# Patient Record
Sex: Female | Born: 1965 | Race: Black or African American | Hispanic: No | Marital: Single
Health system: Southern US, Community
[De-identification: ages and names within clinical notes are randomized; demographics above are authoritative.]

## PROBLEM LIST (undated history)

## (undated) DIAGNOSIS — K219 Gastro-esophageal reflux disease without esophagitis: Secondary | ICD-10-CM

## (undated) DIAGNOSIS — Z86010 Personal history of colonic polyps: Secondary | ICD-10-CM

## (undated) DIAGNOSIS — D649 Anemia, unspecified: Secondary | ICD-10-CM

## (undated) DIAGNOSIS — Z860101 Personal history of adenomatous and serrated colon polyps: Secondary | ICD-10-CM

## (undated) DIAGNOSIS — F329 Major depressive disorder, single episode, unspecified: Secondary | ICD-10-CM

## (undated) DIAGNOSIS — D219 Benign neoplasm of connective and other soft tissue, unspecified: Secondary | ICD-10-CM

## (undated) DIAGNOSIS — N393 Stress incontinence (female) (male): Secondary | ICD-10-CM

## (undated) DIAGNOSIS — K589 Irritable bowel syndrome without diarrhea: Secondary | ICD-10-CM

## (undated) DIAGNOSIS — M8580 Other specified disorders of bone density and structure, unspecified site: Secondary | ICD-10-CM

## (undated) DIAGNOSIS — F32A Depression, unspecified: Secondary | ICD-10-CM

## (undated) DIAGNOSIS — K449 Diaphragmatic hernia without obstruction or gangrene: Secondary | ICD-10-CM

## (undated) DIAGNOSIS — I10 Essential (primary) hypertension: Secondary | ICD-10-CM

## (undated) HISTORY — DX: Major depressive disorder, single episode, unspecified: F32.9

## (undated) HISTORY — PX: UTERINE FIBROID EMBOLIZATION: SHX825

## (undated) HISTORY — DX: Irritable bowel syndrome without diarrhea: K58.9

## (undated) HISTORY — DX: Gastro-esophageal reflux disease without esophagitis: K21.9

## (undated) HISTORY — DX: Essential (primary) hypertension: I10

## (undated) HISTORY — DX: Diaphragmatic hernia without obstruction or gangrene: K44.9

## (undated) HISTORY — DX: Anemia, unspecified: D64.9

## (undated) HISTORY — DX: Depression, unspecified: F32.A

## (undated) HISTORY — DX: Other specified disorders of bone density and structure, unspecified site: M85.80

## (undated) HISTORY — DX: Personal history of adenomatous and serrated colon polyps: Z86.0101

## (undated) HISTORY — DX: Personal history of colonic polyps: Z86.010

## (undated) HISTORY — PX: POLYPECTOMY: SHX149

## (undated) HISTORY — DX: Stress incontinence (female) (male): N39.3

## (undated) HISTORY — DX: Benign neoplasm of connective and other soft tissue, unspecified: D21.9

---

## 1997-05-24 ENCOUNTER — Ambulatory Visit (HOSPITAL_COMMUNITY): Admission: RE | Admit: 1997-05-24 | Discharge: 1997-05-24 | Payer: Self-pay | Admitting: Obstetrics and Gynecology

## 1998-07-15 ENCOUNTER — Other Ambulatory Visit: Admission: RE | Admit: 1998-07-15 | Discharge: 1998-07-15 | Payer: Self-pay | Admitting: Obstetrics and Gynecology

## 1999-01-05 ENCOUNTER — Ambulatory Visit (HOSPITAL_COMMUNITY): Admission: RE | Admit: 1999-01-05 | Discharge: 1999-01-05 | Payer: Self-pay | Admitting: Internal Medicine

## 1999-09-02 ENCOUNTER — Other Ambulatory Visit: Admission: RE | Admit: 1999-09-02 | Discharge: 1999-09-02 | Payer: Self-pay | Admitting: Obstetrics and Gynecology

## 2000-01-23 ENCOUNTER — Ambulatory Visit (HOSPITAL_COMMUNITY): Admission: RE | Admit: 2000-01-23 | Discharge: 2000-01-23 | Payer: Self-pay | Admitting: Family Medicine

## 2000-01-23 ENCOUNTER — Encounter: Payer: Self-pay | Admitting: Family Medicine

## 2000-09-16 ENCOUNTER — Other Ambulatory Visit: Admission: RE | Admit: 2000-09-16 | Discharge: 2000-09-16 | Payer: Self-pay | Admitting: Obstetrics and Gynecology

## 2000-10-19 ENCOUNTER — Ambulatory Visit (HOSPITAL_COMMUNITY): Admission: RE | Admit: 2000-10-19 | Discharge: 2000-10-19 | Payer: Self-pay | Admitting: Obstetrics and Gynecology

## 2000-10-24 ENCOUNTER — Ambulatory Visit (HOSPITAL_COMMUNITY): Admission: RE | Admit: 2000-10-24 | Discharge: 2000-10-24 | Payer: Self-pay | Admitting: Obstetrics and Gynecology

## 2000-10-24 ENCOUNTER — Encounter: Payer: Self-pay | Admitting: Obstetrics and Gynecology

## 2001-04-29 ENCOUNTER — Encounter: Payer: Self-pay | Admitting: Infectious Diseases

## 2001-04-29 ENCOUNTER — Ambulatory Visit (HOSPITAL_COMMUNITY): Admission: RE | Admit: 2001-04-29 | Discharge: 2001-04-29 | Payer: Self-pay | Admitting: Infectious Diseases

## 2001-11-01 ENCOUNTER — Encounter: Payer: Self-pay | Admitting: Obstetrics and Gynecology

## 2001-11-01 ENCOUNTER — Ambulatory Visit (HOSPITAL_COMMUNITY): Admission: RE | Admit: 2001-11-01 | Discharge: 2001-11-01 | Payer: Self-pay | Admitting: Obstetrics and Gynecology

## 2002-04-30 ENCOUNTER — Encounter: Payer: Self-pay | Admitting: Obstetrics and Gynecology

## 2002-04-30 ENCOUNTER — Encounter: Admission: RE | Admit: 2002-04-30 | Discharge: 2002-04-30 | Payer: Self-pay | Admitting: Obstetrics and Gynecology

## 2002-10-29 ENCOUNTER — Other Ambulatory Visit: Admission: RE | Admit: 2002-10-29 | Discharge: 2002-10-29 | Payer: Self-pay | Admitting: Gynecology

## 2004-08-31 ENCOUNTER — Encounter: Admission: RE | Admit: 2004-08-31 | Discharge: 2004-08-31 | Payer: Self-pay | Admitting: Family Medicine

## 2004-09-29 ENCOUNTER — Ambulatory Visit (HOSPITAL_COMMUNITY): Admission: RE | Admit: 2004-09-29 | Discharge: 2004-09-29 | Payer: Self-pay | Admitting: Obstetrics and Gynecology

## 2004-12-22 ENCOUNTER — Ambulatory Visit: Payer: Self-pay | Admitting: Internal Medicine

## 2004-12-28 ENCOUNTER — Ambulatory Visit: Payer: Self-pay | Admitting: Internal Medicine

## 2005-01-01 ENCOUNTER — Ambulatory Visit: Payer: Self-pay | Admitting: Internal Medicine

## 2005-01-01 HISTORY — PX: COLONOSCOPY: SHX174

## 2005-04-30 ENCOUNTER — Encounter: Admission: RE | Admit: 2005-04-30 | Discharge: 2005-04-30 | Payer: Self-pay | Admitting: Family Medicine

## 2005-12-06 ENCOUNTER — Other Ambulatory Visit: Admission: RE | Admit: 2005-12-06 | Discharge: 2005-12-06 | Payer: Self-pay | Admitting: Gynecology

## 2006-01-24 ENCOUNTER — Ambulatory Visit: Payer: Self-pay | Admitting: Family Medicine

## 2006-01-24 LAB — CONVERTED CEMR LAB
BUN: 15 mg/dL (ref 6–23)
CO2: 29 meq/L (ref 19–32)
Calcium: 9.5 mg/dL (ref 8.4–10.5)
Chloride: 103 meq/L (ref 96–112)
Creatinine, Ser: 1 mg/dL (ref 0.4–1.2)
GFR calc non Af Amer: 65 mL/min
Glomerular Filtration Rate, Af Am: 79 mL/min/{1.73_m2}
Glucose, Bld: 86 mg/dL (ref 70–99)
Potassium: 3.5 meq/L (ref 3.5–5.1)
Sodium: 138 meq/L (ref 135–145)

## 2006-05-05 ENCOUNTER — Ambulatory Visit: Payer: Self-pay | Admitting: Family Medicine

## 2006-05-05 LAB — CONVERTED CEMR LAB
Cholesterol: 181 mg/dL (ref 0–200)
Glucose, Bld: 77 mg/dL (ref 70–99)
HCT: 37.4 % (ref 36.0–46.0)
HDL: 56.4 mg/dL (ref >39.0)
Hemoglobin: 13 g/dL (ref 12.0–15.0)
LDL Cholesterol: 107 mg/dL — ABNORMAL HIGH (ref 0–99)
MCHC: 34.8 g/dL (ref 30.0–36.0)
MCV: 87.1 fL (ref 78.0–100.0)
Platelets: 213 10*3/uL (ref 150–400)
RBC: 4.3 M/uL (ref 3.87–5.11)
RDW: 12.9 % (ref 11.5–14.6)
TSH: 0.57 microintl units/mL (ref 0.35–5.50)
Total CHOL/HDL Ratio: 3.2
Triglycerides: 87 mg/dL (ref 0–149)
VLDL: 17 mg/dL (ref 0–40)
WBC: 5.1 10*3/uL (ref 4.5–10.5)

## 2006-05-29 ENCOUNTER — Encounter: Admission: RE | Admit: 2006-05-29 | Discharge: 2006-05-29 | Payer: Self-pay | Admitting: Family Medicine

## 2006-08-04 DIAGNOSIS — D649 Anemia, unspecified: Secondary | ICD-10-CM | POA: Insufficient documentation

## 2006-08-04 DIAGNOSIS — I10 Essential (primary) hypertension: Secondary | ICD-10-CM

## 2006-08-04 DIAGNOSIS — K589 Irritable bowel syndrome without diarrhea: Secondary | ICD-10-CM | POA: Insufficient documentation

## 2006-08-04 DIAGNOSIS — F32A Depression, unspecified: Secondary | ICD-10-CM | POA: Insufficient documentation

## 2006-08-04 DIAGNOSIS — F329 Major depressive disorder, single episode, unspecified: Secondary | ICD-10-CM | POA: Insufficient documentation

## 2006-08-04 HISTORY — DX: Irritable bowel syndrome without diarrhea: K58.9

## 2006-08-04 HISTORY — DX: Essential (primary) hypertension: I10

## 2006-11-04 ENCOUNTER — Telehealth (INDEPENDENT_AMBULATORY_CARE_PROVIDER_SITE_OTHER): Payer: Self-pay | Admitting: *Deleted

## 2007-01-13 ENCOUNTER — Other Ambulatory Visit: Admission: RE | Admit: 2007-01-13 | Discharge: 2007-01-13 | Payer: Self-pay | Admitting: Gynecology

## 2007-03-06 ENCOUNTER — Ambulatory Visit: Payer: Self-pay | Admitting: Family Medicine

## 2007-03-13 ENCOUNTER — Telehealth (INDEPENDENT_AMBULATORY_CARE_PROVIDER_SITE_OTHER): Payer: Self-pay | Admitting: *Deleted

## 2007-03-16 ENCOUNTER — Ambulatory Visit: Payer: Self-pay | Admitting: Family Medicine

## 2007-03-17 ENCOUNTER — Telehealth (INDEPENDENT_AMBULATORY_CARE_PROVIDER_SITE_OTHER): Payer: Self-pay | Admitting: *Deleted

## 2007-03-20 ENCOUNTER — Encounter (INDEPENDENT_AMBULATORY_CARE_PROVIDER_SITE_OTHER): Payer: Self-pay | Admitting: *Deleted

## 2007-04-05 ENCOUNTER — Encounter (INDEPENDENT_AMBULATORY_CARE_PROVIDER_SITE_OTHER): Payer: Self-pay | Admitting: *Deleted

## 2007-04-05 ENCOUNTER — Ambulatory Visit: Payer: Self-pay | Admitting: Family Medicine

## 2007-04-05 LAB — CONVERTED CEMR LAB
BUN: 11 mg/dL (ref 6–23)
CO2: 30 meq/L (ref 19–32)
Calcium: 9.3 mg/dL (ref 8.4–10.5)
Chloride: 101 meq/L (ref 96–112)
Cholesterol: 171 mg/dL (ref 0–200)
Creatinine, Ser: 0.9 mg/dL (ref 0.4–1.2)
GFR calc Af Amer: 88 mL/min
GFR calc non Af Amer: 73 mL/min
Glucose, Bld: 86 mg/dL (ref 70–99)
HDL: 48.9 mg/dL (ref >39.0)
LDL Cholesterol: 106 mg/dL — ABNORMAL HIGH (ref 0–99)
Potassium: 3.3 meq/L — ABNORMAL LOW (ref 3.5–5.1)
Sodium: 138 meq/L (ref 135–145)
TSH: 1.01 microintl units/mL (ref 0.35–5.50)
Total CHOL/HDL Ratio: 3.5
Triglycerides: 83 mg/dL (ref 0–149)
VLDL: 17 mg/dL (ref 0–40)

## 2007-05-31 ENCOUNTER — Telehealth (INDEPENDENT_AMBULATORY_CARE_PROVIDER_SITE_OTHER): Payer: Self-pay | Admitting: *Deleted

## 2007-06-30 ENCOUNTER — Encounter: Admission: RE | Admit: 2007-06-30 | Discharge: 2007-06-30 | Payer: Self-pay | Admitting: Family Medicine

## 2007-07-07 ENCOUNTER — Telehealth (INDEPENDENT_AMBULATORY_CARE_PROVIDER_SITE_OTHER): Payer: Self-pay | Admitting: *Deleted

## 2007-08-08 ENCOUNTER — Telehealth (INDEPENDENT_AMBULATORY_CARE_PROVIDER_SITE_OTHER): Payer: Self-pay | Admitting: *Deleted

## 2007-12-21 ENCOUNTER — Other Ambulatory Visit: Admission: RE | Admit: 2007-12-21 | Discharge: 2007-12-21 | Payer: Self-pay | Admitting: Gynecology

## 2007-12-25 ENCOUNTER — Ambulatory Visit (HOSPITAL_COMMUNITY): Admission: RE | Admit: 2007-12-25 | Discharge: 2007-12-25 | Payer: Self-pay | Admitting: Obstetrics & Gynecology

## 2008-01-18 ENCOUNTER — Ambulatory Visit: Payer: Self-pay | Admitting: Family Medicine

## 2008-01-18 ENCOUNTER — Other Ambulatory Visit: Admission: RE | Admit: 2008-01-18 | Discharge: 2008-01-18 | Payer: Self-pay | Admitting: Gynecology

## 2008-01-18 DIAGNOSIS — M25559 Pain in unspecified hip: Secondary | ICD-10-CM | POA: Insufficient documentation

## 2008-04-08 ENCOUNTER — Telehealth (INDEPENDENT_AMBULATORY_CARE_PROVIDER_SITE_OTHER): Payer: Self-pay | Admitting: *Deleted

## 2008-04-16 ENCOUNTER — Telehealth (INDEPENDENT_AMBULATORY_CARE_PROVIDER_SITE_OTHER): Payer: Self-pay | Admitting: *Deleted

## 2009-11-17 ENCOUNTER — Encounter (INDEPENDENT_AMBULATORY_CARE_PROVIDER_SITE_OTHER): Payer: Self-pay | Admitting: *Deleted

## 2010-05-12 NOTE — Letter (Signed)
Summary: Results Follow-up Letter  [place] at [facility]  [address] [place], [place] [address]   Phone: [phone]  Fax: [phone]    03/20/2007        Erica [name] [address] [place], [place]  [address]  Dear Ms. Maltz,   The following are the results of your recent test(s):  Test     Result     Pap Smear    Normal_______  Not Normal_____       Comments: _________________________________________________________ Cholesterol LDL(Bad cholesterol):          Your goal is less than:         HDL (Good cholesterol):        Your goal is more than: _________________________________________________________ Other Tests:   _________________________________________________________  Please call for an appointment Or _Please see attached.________________________________________________________ _________________________________________________________ _________________________________________________________  Sincerely,  Ardyth Man [place] at [facility]

## 2010-05-12 NOTE — Progress Notes (Signed)
  Phone Note Outgoing Call   Call placed by: Ardyth Man,  November 04, 2006 4:24 PM Call placed to: Patient Summary of Call: [facility] ...................................................................Ardyth Man  November 04, 2006 4:24 PM   Follow-up for Phone Call        Pt. aware to p/u rx at pharmacy. ...................................................................Ardyth Man  November 07, 2006 4:29 PM  Follow-up by: Ardyth Man,  November 07, 2006 4:29 PM

## 2010-05-12 NOTE — Progress Notes (Signed)
  Phone Note Call from Patient Call back at Home Phone [phone]   Caller: Patient Call For: Neena Rhymes MD Summary of Call: Spoke with patient and is extremely upset, forwarded back to Hillary Bow,  Patient stated:  "She will see [place] in court". That Dr. Blossom Hoops never saw her for her hypertension problem".  Patient extremely upset and irate, I asked the patient to please call Hillary Bow in regards to the problem that she is having with Dr. Beverely Low and that Mrs. Chestine Spore would be able to answer more questions in regards to her billing that I would be at this time.  Patient stated:"She will see [place] in court" and the phone call ended with her slamming the phone down".  Ardyth Man  April 16, 2008 2:27 PM  Initial call taken by: Ardyth Man,  April 16, 2008 2:27 PM

## 2010-05-12 NOTE — Progress Notes (Signed)
Summary: CITALOPRAM REFILL  Phone Note Refill Request Message from:  Fax from Pharmacy on August 08, 2007 8:29 AM  Refills Requested: Medication #1:  CITALOPRAM HYDROBROMIDE 20 MG  TABS Take one tablet daily   Last Ref[facility]: [facility] [phone]  Initial call taken by: Doristine Devoid,  August 08, 2007 8:30 AM      Prescriptions: CITALOPRAM HYDROBROMIDE 20 MG  TABS (CITALOPRAM HYDROBROMIDE) Take one tablet daily  #30 x 1   Entered by:   Ardyth Man   Authorized by:   Loreen Freud DO   Signed by:   Ardyth Man on 08/08/2007   Method used:   Electronically [facility] to ...       [facility]  [address] [id number]       [address]       [place], [place]  [address]       Ph: [phone] or [phone]       Fax: [phone]   RxID:   [id number]

## 2010-05-12 NOTE — Assessment & Plan Note (Signed)
Summary: new /est old dr. Alej/cpx/ph   Vital Signs:  Patient Profile:   45 Years Old Female Height:     66.75 inches Weight:      167.8 pounds Pulse rate:   76 / minute Resp:     12 per minute BP sitting:   128 / 80  (left arm)  Vitals Entered By: Doristine Devoid (January 18, 2008 9:18 AM)                 Chief Complaint:  new est-hip pain more so right hip hurts constant and when walking.  History of Present Illness: 45 yo woman w/ bilateral hip pain, R>L.  Pain since 2000 intermittantly, but now more frequently.  Worst when walking on treadmill/working out.  Pain doesn't improve w/ rest.  Pain w/ sleeping on side.  Difficulty rising to standing position and initiating walking.  No hx of similar.  Mom with osteoporosis.  Pt trying to 'walk through' the pain, not limiting activities.  Tylenol takes edge off but doesn't relieve.  No pain in groin.  HTN- BP well controlled, taking medicine regularly w/out difficulty.  No CP, SOB, N/V, edema, HAs, visual changes.    Current Allergies: No known allergies    Family History:    Mom w/ Hodgkin's Lymphoma    Sister w/ breast CA    Review of Systems      See HPI   Physical Exam  General:     Well-developed,well-nourished,in no acute distress; alert,appropriate and cooperative throughout examination Neck:     No deformities, masses, or tenderness noted. Lungs:     Normal respiratory effort, chest expands symmetrically. Lungs are clear to auscultation, no crackles or wheezes. Heart:     Normal rate and regular rhythm. S1 and S2 normal without gallop, murmur, click, rub or other extra sounds. Msk:     Full ROM of hips bilaterally, no pain w/ rotation, flexion, extension.  Mild TTP over R trochanter. Pulses:     +2 radial and DP pulses Extremities:     No clubbing, cyanosis, edema, or deformity noted with normal full range of motion of all joints.      Impression & Recommendations:  Problem # 1:  HYPERTENSION  (ICD-401.9) Assessment: Unchanged BP well controlled, no problems w/ medication.  No changes at this time.  Will f/u in Jan for CPE and labs. Her updated medication list for this problem includes:    Hydrochlorothiazide 25 Mg Tabs (Hydrochlorothiazide) .Marland Kitchen... Take one tablet daily   Problem # 2:  HIP PAIN, BILATERAL (ICD-719.45) Assessment: New Pt likely has trochanteric bursitis, R>L.  Will treat w/ NSAIDs, ice/heat, and continued exercise.  Pt expresses understanding and is in agreement w/ this plan. Her updated medication list for this problem includes:    Naprosyn 500 Mg Tabs (Naproxen) .Marland Kitchen... 1 tab by mouth two times a day x10 days and then as needed.  take with food.   Complete Medication List: 1)  Hydrochlorothiazide 25 Mg Tabs (Hydrochlorothiazide) .... Take one tablet daily 2)  Naprosyn 500 Mg Tabs (Naproxen) .Marland Kitchen.. 1 tab by mouth two times a day x10 days and then as needed.  take with food.   Patient Instructions: 1)  Follow up if hip pain is no better- we will do your physical exam in January 2)  Keep up the good work on the blood pressure- it looks great! 3)  You likely have bursitis- this will get better with the anti-inflammatories and the ice/heat. 4)  Continue to exercise as able! 5)  Call with any questions or concerns   Prescriptions: HYDROCHLOROTHIAZIDE 25 MG  TABS (HYDROCHLOROTHIAZIDE) Take one tablet daily  #30 x 0   Entered and Authorized by:   Neena Rhymes MD   Signed by:   Neena Rhymes MD on 01/18/2008   Method used:   Print then Give to Patient   RxID:   [id number] HYDROCHLOROTHIAZIDE 25 MG  TABS (HYDROCHLOROTHIAZIDE) Take one tablet daily  #90 x 2   Entered and Authorized by:   Neena Rhymes MD   Signed by:   Neena Rhymes MD on 01/18/2008   Method used:   Print then Give to Patient   RxID:   [id number] NAPROSYN 500 MG TABS (NAPROXEN) 1 tab by mouth two times a day x10 days and then as needed.  Take with food.  #60 x 0   Entered  and Authorized by:   Neena Rhymes MD   Signed by:   Neena Rhymes MD on 01/18/2008   Method used:   Print then Give to Patient   RxID:   [id number]  ]

## 2010-05-12 NOTE — Progress Notes (Signed)
Summary: Left message for patient to call the office 05/31/2007  Phone Note Refill Request   Refills Requested: Medication #1:  CITALOPRAM HYDROBROMIDE 10 MG  TABS Take one tablet daily [facility] [address] [facility] fax [phone] - ph [phone] - note on request is "pt is out of tablets b/c pt was told to take 2 tab sometimes.  ins wont pay til [id number] n can we get a new rx with different directions to allow two times a day dosing if needed"  Initial call taken by: Okey Regal [place],  May 31, 2007 11:52 AM  Follow-up for Phone Call        Dr. Blossom Hoops,  Is this okay?  I don't see in your notes where you told the patient to take 2 tab's ...................................................................Ardyth Man  May 31, 2007 12:15 PM Was last given in December 2008 #30 w/6 refills. Follow-up by: Ardyth Man,  May 31, 2007 12:15 PM  Additional Follow-up for Phone Call Additional follow up Details #1::        We can increase dose to 20 mg daily if she finds this is more effective. 30/1. Recheck in 4 weeks to see how she is doing. Additional Follow-up by: Leanne Chang MD,  May 31, 2007 2:49 PM    Additional Follow-up for Phone Call Additional follow up Details #2::    Left message for patient to call the office ...................................................................Ardyth Man  May 31, 2007 5:09 PM  Follow-up by: Ardyth Man,  May 31, 2007 5:09 PM  Additional Follow-up for Phone Call Additional follow up Details #3:: Details for Additional Follow-up Action Taken: Patient left message on my voicemail, there is no way to get in touch with her through her job,  patient will call back at 2pm ...................................................................Ardyth Man  June 01, 2007 11:20 AM Patient aware and rx sent to [facility] [address].  ...................................................................Ardyth Man   June 01, 2007 4:21 PM  Additional Follow-up by: Ardyth Man,  June 01, 2007 11:20 AM  New/Updated Medications: CITALOPRAM HYDROBROMIDE 20 MG  TABS (CITALOPRAM HYDROBROMIDE) Take one tablet daily   Prescriptions: CITALOPRAM HYDROBROMIDE 20 MG  TABS (CITALOPRAM HYDROBROMIDE) Take one tablet daily  #30 x 1   Entered by:   Ardyth Man   Authorized by:   Leanne Chang MD   Signed by:   Ardyth Man on 06/01/2007   Method used:   Elect[facility] to ...       [facility]  [address] [id number]       [address]       [place], [place]  [address]       Ph: [phone] or [phone]       Fax: [phone]   RxID:   [id number]

## 2010-05-12 NOTE — Progress Notes (Signed)
  Phone Note Outgoing Call Call back at [facility] Phone [phone]   Call placed by: Ardyth Man,  March 17, 2007 11:55 AM Call placed to: Patient Summary of Call: Left message for pt. to call the office (xray okay, no pneumonia) ...................................................................Ardyth Man  March 17, 2007 11:56 AM   Follow-up for Phone Call        No answer at home # ...................................................................Ardyth Man  March 17, 2007 4:55 PM   Follow-up by: Ardyth Man,  March 17, 2007 4:55 PM  Additional Follow-up for Phone Call Additional follow up Details #1::        No answer at home # ...................................................................Ardyth Man  March 20, 2007 5:11 PM Mailed lab letter ...................................................................Ardyth Man  March 20, 2007 5:12 PM  Additional Follow-up by: Ardyth Man,  March 20, 2007 5:11 PM

## 2010-05-12 NOTE — Assessment & Plan Note (Signed)
Summary: cough,sore throat,congested/cbs   Vital Signs:  Patient Profile:   45 Years Old Female Weight:      174 pounds Temp:     98.6 degrees F oral Pulse rate:   72 / minute Resp:     16 per minute BP sitting:   120 / 78  (right arm)  Pt. in pain?   no  Vitals Entered By: Ardyth Man (March 06, 2007 1:29 PM)                  Chief Complaint:  congestion, sore throat, cough since Friday, and URI symptoms.  History of Present Illness:  URI Symptoms      This is a 45 year old woman who presents with URI symptoms.  The symptoms began duration > 3 days ago.  The patient reports nasal congestion, sore throat, productive cough, and sick contacts.  The patient denies fever.  The patient denies itchy watery eyes, itchy throat, and sneezing.  The patient denies the following risk factors for Strep sinusitis: unilateral facial pain, unilateral nasal discharge, poor response to decongestant, and tooth pain.    URI Symptoms      The patient denies dyspnea and wheezing.          Physical Exam  General:     Well-developed,well-nourished,in no acute distress; alert,appropriate and cooperative throughout examination Ears:     External ear exam shows no significant lesions or deformities.  Otoscopic examination reveals clear canals, tympanic membranes are intact bilaterally without bulging, retraction, inflammation or discharge. Hearing is grossly normal bilaterally. Nose:     nasal dischargemucosal pallor.   Mouth:     Oral mucosa and oropharynx without lesions or exudates.  Teeth in good repair. Neck:     No deformities, masses, or tenderness noted. Lungs:     Normal respiratory effort, chest expands symmetrically. Lungs are clear to auscultation, no crackles or wheezes. Heart:     Normal rate and regular rhythm. S1 and S2 normal without gallop, murmur, click, rub or other extra sounds.    Impression & Recommendations:  Problem # 1:  URI (ICD-465.9)  Her updated  medication list for this problem includes:    Tussionex Pennkinetic Er 8-10 Mg/40ml Lqcr (Chlorpheniramine-hydrocodone) .Marland Kitchen... 1 tsp by mouth at bedtime: side effects reviewed Instructed on symptomatic treatment. Call if symptoms persist more than 5 -7 days. F/u if they worsen. Recommend Robitussin Max in the morning. Her updated medication list for this problem includes:    Tussionex Pennkinetic Er 8-10 Mg/84ml Lqcr (Chlorpheniramine-hydrocodone) .Marland Kitchen... 1 tsp by mouth qhs   Complete Medication List: 1)  Citalopram Hydrobromide 10 Mg Tabs (Citalopram hydrobromide) .... Take one tablet daily 2)  Hydrochlorothiazide 25 Mg Tabs (Hydrochlorothiazide) .... Take one tablet daily 3)  Tussionex Pennkinetic Er 8-10 Mg/82ml Lqcr (Chlorpheniramine-hydrocodone) .Marland Kitchen.. 1 tsp by mouth qhs     Prescriptions: TUSSIONEX PENNKINETIC ER 8-10 MG/5ML  LQCR (CHLORPHENIRAMINE-HYDROCODONE) 1 tsp by mouth qhs  #6 oz x 0   Entered and Authorized by:   Leanne Chang MD   Signed by:   Leanne Chang MD on 03/06/2007   Method used:   Print then Give to Patient   RxID:   [id number]  ]

## 2010-05-12 NOTE — Assessment & Plan Note (Signed)
Summary: med refill .cbs   Vital Signs:  Patient Profile:   45 Years Old Female Weight:      173.25 pounds Temp:     98.4 degrees F oral Pulse rate:   72 / minute Resp:     14 per minute BP sitting:   118 / 74  (right arm)  Pt. in pain?   no  Vitals Entered By: Ardyth Man (April 05, 2007 8:17 AM)                  Chief Complaint:  Med refill HCTZ and Citalopram.  History of Present Illness:  Hypertension Follow-Up      This is a 45 year old woman who presents for Hypertension follow-up.  The patient denies lightheadedness and edema.  The patient denies the following associated symptoms: chest pain, dyspnea, palpitations, leg edema, and pedal edema.  Compliance with medications (by patient report) has been near 100%.  The patient reports that dietary compliance has been fair.  The patient reports no exercise.    Depression F/u  Overall doing well. Denies depression and anxiety, but was wondering if she can increase her dose around her menstrual cycle since she noted depressed mood around that time.        Physical Exam  General:     Well-developed,well-nourished,in no acute distress; alert,appropriate and cooperative throughout examination Neck:     No deformities, masses, or tenderness noted. Lungs:     Normal respiratory effort, chest expands symmetrically. Lungs are clear to auscultation, no crackles or wheezes. Heart:     Normal rate and regular rhythm. S1 and S2 normal without gallop, murmur, click, rub or other extra sounds. Pulses:     R and L carotid,radial,dorsalis pedis and posterior tibial pulses are full and equal bilaterally Extremities:     No clubbing, cyanosis, edema, or deformity noted with normal full range of motion of all joints.      Impression & Recommendations:  Problem # 1:  HYPERTENSION (ICD-401.9) Stable f/u in 6 months and prn Her updated medication list for this problem includes:    Hydrochlorothiazide 25 Mg Tabs  (Hydrochlorothiazide) .Marland Kitchen... Take one tablet daily  Orders: TLB-BMP (Basic Metabolic Panel-BMET) (80048-METABOL) TLB-Lipid Panel (80061-LIPID) Venipuncture (45409) TLB-TSH (Thyroid Stimulating Hormone) (84443-TSH)  BP today: 118/74 Prior BP: 120/78 (03/06/2007)  Labs Reviewed: Creat: 1.0 (01/24/2006) Chol: 181 (05/05/2006)   HDL: 56.4 (05/05/2006)   LDL: 107 (05/05/2006)   TG: 87 (05/05/2006)   Problem # 2:  DEPRESSION (ICD-311) Stable Advise patient it was okay to increase to 20mg  10 days prior to her menstrual period and back to baseline afterwards. Her updated medication list for this problem includes:    Citalopram Hydrobromide 10 Mg Tabs (Citalopram hydrobromide) .Marland Kitchen... Take one tablet daily   Complete Medication List: 1)  Citalopram Hydrobromide 10 Mg Tabs (Citalopram hydrobromide) .... Take one tablet daily 2)  Hydrochlorothiazide 25 Mg Tabs (Hydrochlorothiazide) .... Take one tablet daily  Other Orders: Influenza Vaccine NON MCR (81191)     Prescriptions: HYDROCHLOROTHIAZIDE 25 MG  TABS (HYDROCHLOROTHIAZIDE) Take one tablet daily  #90 x 2   Entered and Authorized by:   Leanne Chang MD   Signed by:   Leanne Chang MD on 04/05/2007   Method used:   Print then Give to Patient   RxID:   [id number] CITALOPRAM HYDROBROMIDE 10 MG  TABS (CITALOPRAM HYDROBROMIDE) Take one tablet daily  #30 x 6   Entered and Authorized by:   Leanne Chang MD  Signed by:   Leanne Chang MD on 04/05/2007   Method used:   [facility] to ...       [facility]  [address] [id number]       [address]       [place], [place]  [address]       Ph: [phone] or [phone]       Fax: [phone]   RxID:   [id number]  ]  Influenza Vaccine    Vaccine Type: Fluvax Non-MCR    Site: left deltoid    Mfr: Sanofi Pasteur    Dose: 0.5 ml    Route: IM    Given by: Ardyth Man    Exp. Date: 10/10/2007    Lot #: [id number]    VIS given: 11/03/06 version  given April 05, 2007.  Flu Vaccine Consent Questions    Do you have a history of severe allergic reactions to this vaccine? no    Any prior history of allergic reactions to egg and/or gelatin? no    Do you have a sensitivity to the preservative Thimersol? no    Do you have a past history of Guillan-Barre Syndrome? no    Do you currently have an acute febrile illness? no    Have you ever had a severe reaction to latex? no    Vaccine information given and explained to patient? yes    Are you currently pregnant? no

## 2010-05-12 NOTE — Progress Notes (Signed)
Summary: [facility] 07/07/07  Phone Note Outgoing Call Call back at Home Phone [phone]   Call placed by: Ardyth Man,  July 07, 2007 8:06 AM Call placed to: Patient Summary of Call: Left message for patient to call the office,  negative mammogram ...................................................................Ardyth Man  July 07, 2007 8:07 AM   Follow-up for Phone Call        Left message for patient to call the office,  and letter sent from mammogram office to patient ...................................................................Ardyth Man  July 10, 2007 2:41 PM  Follow-up by: Ardyth Man,  July 10, 2007 2:41 PM

## 2010-05-12 NOTE — Progress Notes (Signed)
Summary: [facility] 04/15/08 mu, cma  Phone Note Call from Patient Call back at Home Phone [phone]   Summary of Call: patient called complaining about bill - she said  she was told she could  have 2 free bp follow ups but ov was about knee pain - therefore ins was billed - patient had not met deductible so it was applied to deductible - patient said she wil find a new dr  Initial call taken by: Okey Regal [place],  April 08, 2008 5:03 PM  Follow-up for Phone Call        Dr. Beverely Low, This patient's last visit was for hip pain, please review and advise so that I may call the patient and give explanation.  2 free BP checks?  Thanks,  Ardyth Man  April 15, 2008 7:51 AM  Follow-up by: Ardyth Man,  April 15, 2008 7:52 AM  Additional Follow-up for Phone Call Additional follow up Details #1::        this pt came in for CPE and was told that she was too early for physical and that insurance would not pay for it- thought this would be helpful for pt.  told she could reschedule.  told pt that w/ dx of HTN it is standard practice/standard of care  to see pt's at least every 6 months and that her CPE visit could be switched to BP visit b/c she hadn't been seen in 10+ months.  at no time mentioned it would be 'free'.  pt then chose to spend the visit discussing an entirely new problem- her bilateral trochanteric bursitis.  meds were prescribed, medical advice was given and an appropriate level 3 visit was charged.  her insurance did in fact cover it but she hadn't met her deductible- which we have no way of knowing (and if she's unhappy she needs to take this [place] with them).  Aram Beecham has dealt with this, Pam has dealt with this- and it has been decided that this visit will not be written off.  Pt called Okey Regal angry last week calling her a Sales promotion account executive, me a Sales promotion account executive, and threatening a Clinical research associate.  This whole thing is ridiculous and if she would like to find another doctor- one who provides care for free- i would strongly  encourage it. Additional Follow-up by: Neena Rhymes MD,  April 15, 2008 8:06 AM    Additional Follow-up for Phone Call Additional follow up Details #2::    LEFT MESSAGE FOR PATIENT TO CALL THE OFFICE Ardyth Man  April 15, 2008 8:14 AM  Follow-up by: Ardyth Man,  April 15, 2008 8:14 AM  Additional Follow-up for Phone Call Additional follow up Details #3:: Details for Additional Follow-up Action Taken: Referred patient back to Hillary Bow.  Ardyth Man  April 16, 2008 11:36 AM  Additional Follow-up by: Ardyth Man,  April 16, 2008 11:36 AM

## 2010-05-12 NOTE — Progress Notes (Signed)
Summary: [facility]  Phone Note Call from Patient Call back at [facility] Phone [phone]   Caller: Patient Reason for Call: Talk to Nurse Summary of Call: PER DR SHE WAS TOLD TO CALL BACK AND REQUEST AN ANTIBIOTIC IF SHE WAS NO BETTER.  SHE IS CALLING TO REQUEST THAT NOW.  HER [facility]ARM[facility]Y IS [facility] [address] Initial call taken by: Gwen Pounds,  March 13, 2007 8:53 AM  Follow-up for Phone Call        spoke  with pt said very congested, coughing with phlem yellow mixed with blood in phlegmwas given tussionex, taking along with the robitussin not helping was seen on 03/06/07, pt said dr Blossom Hoops said if not cleared up by monday  call back and will call in a abx--[facility] [address]  PLEASE ADVISE  also pt asked about [facility] citalopram rx I told pt it was denied,and dr said need appointment, pt said IIi was just there 11/24 ..................................................................Marland KitchenKandice Hams  March 13, 2007 10:05 AM   Follow-up by: Kandice Hams,  March 13, 2007 10:09 AM  Additional Follow-up for Phone Call Additional follow up Details #1::        Patientwasn't having blood in phelgm at last visit. We need to order a chest x-ray. Is she feeling worse or the same? If worse, I need to see her. We will call medication after x-ray. Please have patient go today. Please schedule chest x-ray Additional Follow-up by: Leanne Chang MD,  March 13, 2007 1:42 PM  New Problems: COUGH [phone])   Additional Follow-up for Phone Call Additional follow up Details #2::    Left message for patient to call the office to set up chest xray and that she needs an ov per Dr. Blossom Hoops ...................................................................Ardyth Man  March 13, 2007 3:30 PM  Follow-up by: Ardyth Man,  March 13, 2007 3:30 PM  Additional Follow-up for Phone Call Additional follow up Details #3:: Details for Additional Follow-up Action Taken: Pt. not feeling worse, just  cannot quit coughing and would like to have the xray done at [facility] and pt. aware that we will call her as soon as we get the results back. ..................................................................Marland KitchenOkey Regal Spring  March 13, 2007 3:48 PM Pt. needs an ov for citalopram refill appt [id number] Gave #30 on citalopram and pt. has appt. next week scheduled (verified) ...................................................................Ardyth Man  March 13, 2007 4:29 PM  Additional Follow-up by: Okey Regal Spring,  March 13, 2007 3:48 PM  New Problems: COUGH (ICD-786.2)   Prescriptions: CITALOPRAM HYDROBROMIDE 10 MG  TABS (CITALOPRAM HYDROBROMIDE) Take one tablet daily  #30 x 0   Entered by:   Ardyth Man   Authorized by:   Leanne Chang MD   Signed by:   Ardyth Man on 03/13/2007   Method used:   Electronically [facility] to ...       [facility]  [address] [id number]       [address]       [place], [place]  [address]       Ph: [phone] or [phone]       Fax: [phone]   RxID:   [id number]

## 2010-05-12 NOTE — Letter (Signed)
Summary: Colonoscopy Letter  [facility]  [address] [place], [place] [address]   Phone: [phone]  Fax: [phone]      November 17, 2009 MRN: [id number]   Erica [name] [address] [place], [place]  [address]   Dear [name],   According to your medical record, it is time for you to schedule a Colonoscopy. The American Cancer Society recommends this procedure as a method to detect early colon cancer. Patients with a family history of colon cancer, or a personal history of colon polyps or inflammatory bowel disease are at increased risk.  This letter has been generated based on the recommendations made at the time of your procedure. If you feel that in your particular situation this may no longer apply, please contact our office.  Please call our office at [phone] to schedule this appointment or to update your records at your earliest convenience.  Thank you for cooperating with [place] to provide you with the very best care possible.   Sincerely,  Hedwig Morton. Juanda Chance, M.D.  [facility] [phone]

## 2010-05-12 NOTE — Letter (Signed)
Summary: Results Follow-up Letter  [place] [facility]  [address] [place], [place] [address]   Phone: [phone]  Fax: [phone]    04/05/2007        Roxanne Gates. [place] [address] [place], [place]  [address]  Dear Ms. Spath,   The following are the results of your recent test(s):  Test     Result     Pap Smear    Normal_______  Not Normal_____       Comments: _________________________________________________________ Cholesterol LDL(Bad cholesterol):          Your goal is less than:         HDL (Good cholesterol):        Your goal is more than: _________________________________________________________ Other Tests:   _________________________________________________________  Please call for an appointment Or _Please see attached.________________________________________________________ _________________________________________________________ _________________________________________________________  Sincerely,  Ardyth Man [place] at [facility]

## 2010-08-28 NOTE — Assessment & Plan Note (Signed)
[facility]                        [place] OFFICE NOTE   Erica, [name]                       MRN:          [id number]  DATE:05/05/2006                            DOB:          04-11-1966    REASON FOR VISIT:  Complete physical examination.  Erica [name] is a 45-  year-old female establishing care from [facility].  She is here for a complete physical examination.   Erica [name] has a history of depression and previously was on Lexapro  daily.  She reports that earlier last year she discontinued the Lexapro  secondary to side effects (i.e., dizziness, palpitations, and tingling  in her fingers).  Those symptoms resolved after she discontinued  Lexapro.  She continues to have depressed mood, anxiety, and  irritability.  She is under a lot of stress due to taking care of her  ailing mother.  Additionally, her work is stressful.  She does report  that Lexapro appeared to help those symptoms, but she could not tolerate  the side effects.  She was hoping there was another option for her.  The  patient has no other significant complaints.  Denies any suicidal or  homicidal ideation.  Of note, her chart is currently not available for  review, but she does have a history of hypertension and is currently on  hydrochlorothiazide 25 mg daily.   PAST MEDICAL HISTORY:  1. Hypertension.  2. Depression.  3. Irritable bowel syndrome.   SURGERIES:  None.   MEDICATIONS:  Hydrochlorothiazide 25 mg daily.   ALLERGIES:  No known drug allergies.   FAMILY HISTORY:  Mother alive with a history of hypertension, Hodgkin's  lymphoma, and myocardial infarction.  Father passed away with a history  of Alzheimer's.  She has a sister with hypertension.  She has 2 brothers  who are alive and well.  She reports her grandfather had a history of  colon cancer.   SOCIAL HISTORY:  The patient works for [employer], single with no  children.   She denies any alcohol or tobacco use.   HEALTH MAINTENANCE:  The patient sees Dr. Chevis Pretty for her gynecologic  health maintenance.   REVIEW OF SYSTEMS:  As per HPI.  Otherwise unremarkable.   OBJECTIVE:  Blood pressure 108/70, respiratory rate of 16, weight 169/2,  height 5 feet 6-1/2 inches.  GENERAL:  We have a pleasant female in no acute distress.  Answers  questions appropriately.  Alert and oriented x3.  HEENT:  Normocephalic, atraumatic.  Pupils equal, round, and reactive to  light.  Extraocular muscles are intact.  No nystagmus.  Nasal mucosa was  unremarkable.  Ear canals within normal limits.  Tympanic membranes were  both clear bilaterally.  Oropharynx was benign.  NECK:  Supple with no lymphadenopathy, carotid bruits, or JVD.  No  thyromegaly.  LUNGS:  Clear.  HEART:  Regular rate and rhythm.  Normal S1, S2.  No murmurs, rubs, or  gallops.  ABDOMEN:  Soft, nondistended.  No palpable masses were felt.  No  hepatosplenomegaly.  No rebound or guarding.  EXTREMITIES:  No  cyanosis, clubbing, or edema.  SKIN:  Unremarkable.  NEUROLOGIC:  Nonfocal.   IMPRESSION:  1. Well adult exam.  2. Depression.   PLAN:  1. Health promotion and screening reviewed, including confirmation of      immunization update, exercise, dental care, and confirmation of      update on her screening Pap smear and mammogram.  2. After a long discussion, the patient agreed to starting a new      antidepressant.  Agreed to Celexa 20 mg daily.  Advised of possible      side effects.  The patient to follow up with me in 1 month for      recheck.  3. We will do routine blood work including cholesterol panel, glucose,      CBC, and TSH.  4. Will review her medical records when they are available.  Further      recommendations if necessary after the review.     Leanne Chang, M.D.  Electronically Signed    LA/MedQ  DD: 05/08/2006  DT: 05/08/2006  Job #: [id number]

## 2010-10-07 ENCOUNTER — Emergency Department (HOSPITAL_COMMUNITY): Payer: 59

## 2010-10-07 ENCOUNTER — Emergency Department (HOSPITAL_COMMUNITY)
Admission: EM | Admit: 2010-10-07 | Discharge: 2010-10-07 | Disposition: A | Discharge status: Home / Self Care | Payer: 59 | Attending: Emergency Medicine | Admitting: Emergency Medicine

## 2010-10-07 ENCOUNTER — Inpatient Hospital Stay (INDEPENDENT_AMBULATORY_CARE_PROVIDER_SITE_OTHER)
Admission: RE | Admit: 2010-10-07 | Discharge: 2010-10-07 | Disposition: A | Discharge status: Home / Self Care | Payer: 59 | Source: Ambulatory Visit | Attending: Emergency Medicine | Admitting: Emergency Medicine

## 2010-10-07 DIAGNOSIS — I10 Essential (primary) hypertension: Secondary | ICD-10-CM | POA: Insufficient documentation

## 2010-10-07 DIAGNOSIS — R079 Chest pain, unspecified: Secondary | ICD-10-CM

## 2010-10-07 DIAGNOSIS — R0789 Other chest pain: Secondary | ICD-10-CM | POA: Insufficient documentation

## 2010-10-07 LAB — CBC
HCT: 36.3 % (ref 36.0–46.0)
Hemoglobin: 12.5 g/dL (ref 12.0–15.0)
MCH: 28.4 pg (ref 26.0–34.0)
MCHC: 34.4 g/dL (ref 30.0–36.0)
MCV: 82.5 fL (ref 78.0–100.0)
Platelets: 211 10*3/uL (ref 150–400)
RBC: 4.4 MIL/uL (ref 3.87–5.11)
RDW: 14 % (ref 11.5–15.5)
WBC: 4.8 10*3/uL (ref 4.0–10.5)

## 2010-10-07 LAB — CK TOTAL AND CKMB (NOT AT ARMC)
CK, MB: 1.4 ng/mL (ref 0.3–4.0)
Relative Index: 1.1 (ref 0.0–2.5)
Total CK: 122 U/L (ref 7–177)

## 2010-10-07 LAB — DIFFERENTIAL
Basophils Absolute: 0 10*3/uL (ref 0.0–0.1)
Basophils Relative: 0 % (ref 0–1)
Eosinophils Absolute: 0 10*3/uL (ref 0.0–0.7)
Eosinophils Relative: 1 % (ref 0–5)
Lymphocytes Relative: 53 % — ABNORMAL HIGH (ref 12–46)
Lymphs Abs: 2.5 10*3/uL (ref 0.7–4.0)
Monocytes Absolute: 0.4 10*3/uL (ref 0.1–1.0)
Monocytes Relative: 9 % (ref 3–12)
Neutro Abs: 1.8 10*3/uL (ref 1.7–7.7)
Neutrophils Relative %: 38 % — ABNORMAL LOW (ref 43–77)

## 2010-10-07 LAB — POCT I-STAT, CHEM 8
BUN: 13 mg/dL (ref 6–23)
Calcium, Ion: 1.25 mmol/L (ref 1.12–1.32)
Chloride: 104 mEq/L (ref 96–112)
Creatinine, Ser: 0.9 mg/dL (ref 0.50–1.10)
Glucose, Bld: 99 mg/dL (ref 70–99)
HCT: 40 % (ref 36.0–46.0)
Hemoglobin: 13.6 g/dL (ref 12.0–15.0)
Potassium: 3.9 mEq/L (ref 3.5–5.1)
Sodium: 139 mEq/L (ref 135–145)
TCO2: 24 mmol/L (ref 0–100)

## 2010-10-07 LAB — TROPONIN I: Troponin I: <0.3 ng/mL (ref <0.30)

## 2011-02-11 ENCOUNTER — Encounter: Payer: Self-pay | Admitting: Internal Medicine

## 2011-03-23 ENCOUNTER — Other Ambulatory Visit: Payer: 59 | Admitting: Internal Medicine

## 2011-04-27 ENCOUNTER — Ambulatory Visit (HOSPITAL_COMMUNITY): Payer: 59 | Admitting: Psychology

## 2011-05-19 ENCOUNTER — Other Ambulatory Visit: Payer: Self-pay | Admitting: Gynecology

## 2011-06-17 ENCOUNTER — Encounter: Payer: Self-pay | Admitting: Internal Medicine

## 2011-06-22 ENCOUNTER — Encounter: Payer: Self-pay | Admitting: *Deleted

## 2011-06-24 ENCOUNTER — Encounter: Payer: Self-pay | Admitting: Internal Medicine

## 2011-06-29 ENCOUNTER — Encounter: Payer: Self-pay | Admitting: Internal Medicine

## 2011-06-29 ENCOUNTER — Ambulatory Visit (INDEPENDENT_AMBULATORY_CARE_PROVIDER_SITE_OTHER): Payer: 59 | Admitting: Internal Medicine

## 2011-06-29 VITALS — BP 112/68 | HR 80 | Ht 67.5 in | Wt 160.0 lb

## 2011-06-29 DIAGNOSIS — K5901 Slow transit constipation: Secondary | ICD-10-CM

## 2011-06-29 MED ORDER — LUBIPROSTONE 24 MCG PO CAPS: 24.0000 ug | ORAL_CAPSULE | Freq: Two times a day (BID) | ORAL | Status: AC

## 2011-06-29 NOTE — Patient Instructions (Signed)
We have sent the following medications to your pharmacy for you to pick up at your convenience: Amitiza 24 mcg twice daily Please call us with an update on your condition in 4 weeks. If symptoms do not improve, we may consider completing a colonoscopy.  High Fiber Diet A high fiber diet changes your normal diet to include more whole grains, legumes, fruits, and vegetables. Changes in the diet involve replacing refined carbohydrates with unrefined foods. The calorie level of the diet is essentially unchanged. The Dietary Reference Intake (recommended amount) for adult males is 38 g per day. For adult females, it is 25 g per day. Pregnant and lactating women should consume 28 g of fiber per day. Fiber is the intact part of a plant that is not broken down during digestion. Functional fiber is fiber that has been isolated from the plant to provide a beneficial effect in the body. PURPOSE  Increase stool bulk.   Ease and regulate bowel movements.   Lower cholesterol.  INDICATIONS THAT YOU NEED MORE FIBER  Constipation and hemorrhoids.   Uncomplicated diverticulosis (intestine condition) and irritable bowel syndrome.   Weight management.   As a protective measure against hardening of the arteries (atherosclerosis), diabetes, and cancer.  NOTE OF CAUTION If you have a digestive or bowel problem, ask your caregiver for advice before adding high fiber foods to your diet. Some of the following medical problems are such that a high fiber diet should not be used without consulting your caregiver:  Acute diverticulitis (intestine infection).   Partial small bowel obstructions.   Complicated diverticular disease involving bleeding, rupture (perforation), or abscess (boil, furuncle).   Presence of autonomic neuropathy (nerve damage) or gastric paresis (stomach cannot empty itself).  GUIDELINES FOR INCREASING FIBER  Start adding fiber to the diet slowly. A gradual increase of about 5 more grams (2  slices of whole-wheat bread, 2 servings of most fruits or vegetables, or 1 bowl of high fiber cereal) per day is best. Too rapid an increase in fiber may result in constipation, flatulence, and bloating.   Drink enough water and fluids to keep your urine clear or pale yellow. Water, juice, or caffeine-free drinks are recommended. Not drinking enough fluid may cause constipation.   Eat a variety of high fiber foods rather than one type of fiber.   Try to increase your intake of fiber through using high fiber foods rather than fiber pills or supplements that contain small amounts of fiber.   The goal is to change the types of food eaten. Do not supplement your present diet with high fiber foods, but replace foods in your present diet.  INCLUDE A VARIETY OF FIBER SOURCES  Replace refined and processed grains with whole grains, canned fruits with fresh fruits, and incorporate other fiber sources. White rice, white breads, and most bakery goods contain little or no fiber.   Brown whole-grain rice, buckwheat oats, and many fruits and vegetables are all good sources of fiber. These include: broccoli, Brussels sprouts, cabbage, cauliflower, beets, sweet potatoes, white potatoes (skin on), carrots, tomatoes, eggplant, squash, berries, fresh fruits, and dried fruits.   Cereals appear to be the richest source of fiber. Cereal fiber is found in whole grains and bran. Bran is the fiber-rich outer coat of cereal grain, which is largely removed in refining. In whole-grain cereals, the bran remains. In breakfast cereals, the largest amount of fiber is found in those with "bran" in their names. The fiber content is sometimes indicated on the  label.   You may need to include additional fruits and vegetables each day.   In baking, for 1 cup white flour, you may use the following substitutions:   1 cup whole-wheat flour minus 2 tbs.    cup white flour plus  cup whole-wheat flour.  Document Released: 03/29/2005  Document Revised: 03/18/2011 Document Reviewed: 02/04/2009 [facility] Patient Information 2012 [place], [place].  CC: Cain Saupe, MD

## 2011-06-29 NOTE — Progress Notes (Signed)
Erica [name] July 30, 1965 MRN [id number]   History of Present Illness:  This is a 46 year old African American female with worsening constipation. She used to have one bowel movement every 2 days but now she doesn't go to the bathroom for 5 days at a time. She has been under a lot of stress with her mother who had a nephrectomy for renal cancer. Patient denies any rectal bleeding. She had a colonoscopy in 2003 which showed a hyperplastic polyp. A repeat colonoscopy in September 2006 for abdominal pain was essentially normal. She has tried MiraLax 17 g daily for 5 days without results. Her weight has been stable around 160 pounds.   Past Medical History  Diagnosis Date  . Fibroids   . Essential hypertension, benign   . Hx of adenomatous colonic polyps   . GERD (gastroesophageal reflux disease)   . HH (hiatus hernia)   . IBS (irritable bowel syndrome)    Past Surgical History  Procedure Date  . Uterine polypectomy     reports that she has never smoked. She has never used smokeless tobacco. She reports that she does not drink alcohol or use illicit drugs. family history includes Alzheimer's disease in her father; Colon cancer in her maternal grandfather; Hodgkin's lymphoma in her mother; and Stroke in her paternal grandfather. No Known Allergies      Review of Systems: She denies nausea vomiting chest pain  The remainder of the 10 point ROS is negative except as outlined in H&P   Physical Exam: General appearance  Well developed, in no distress. Eyes- non icteric. HEENT nontraumatic, normocephalic. Mouth no lesions, tongue papillated, no cheilosis. Neck supple without adenopathy, thyroid not enlarged, no carotid bruits, no JVD. Lungs Clear to auscultation bilaterally. Cor normal S1, normal S2, regular rhythm, no murmur,  quiet precordium. Abdomen: Mildly protuberant and tense with mild tenderness along laparoscopic cholecystectomy line. There was fullness in the sigmoid colon.  Bowel sounds are normoactive. Liver edge at costal margin. Rectal: Soft Hemoccult negative stool. Extremities no pedal edema. Skin no lesions. Neurological alert and oriented x 3. Psychological normal mood and affect.  Assessment and Plan:  Problem #1 Change in bowel habits and constipation, likely slow transit. She has been under a lot of stress with the illness of her mother. Her eating habits have not been regular and neither has her level of activity. I have asked her to increase fiber in her diet and start Amitiza 24 mcg twice a day for a month. We have given her samples. If she is not improved, I would consider a repeat colonoscopy.   06/29/2011 Erica [name]

## 2012-06-29 ENCOUNTER — Other Ambulatory Visit: Payer: Self-pay | Admitting: Family Medicine

## 2012-06-29 ENCOUNTER — Ambulatory Visit
Admission: RE | Admit: 2012-06-29 | Discharge: 2012-06-29 | Disposition: A | Discharge status: Home / Self Care | Payer: No Typology Code available for payment source | Source: Ambulatory Visit | Attending: Family Medicine | Admitting: Family Medicine

## 2012-06-29 DIAGNOSIS — R05 Cough: Secondary | ICD-10-CM

## 2012-06-29 DIAGNOSIS — R059 Cough, unspecified: Secondary | ICD-10-CM

## 2012-09-18 ENCOUNTER — Other Ambulatory Visit: Payer: Self-pay | Admitting: Gynecology

## 2012-09-18 DIAGNOSIS — D259 Leiomyoma of uterus, unspecified: Secondary | ICD-10-CM

## 2012-09-27 ENCOUNTER — Other Ambulatory Visit: Payer: Self-pay | Admitting: Emergency Medicine

## 2012-09-27 ENCOUNTER — Other Ambulatory Visit (HOSPITAL_COMMUNITY): Payer: Self-pay | Admitting: Interventional Radiology

## 2012-09-27 ENCOUNTER — Ambulatory Visit
Admission: RE | Admit: 2012-09-27 | Discharge: 2012-09-27 | Disposition: A | Discharge status: Home / Self Care | Payer: BC Managed Care – PPO | Source: Ambulatory Visit | Attending: Gynecology | Admitting: Gynecology

## 2012-09-27 DIAGNOSIS — D259 Leiomyoma of uterus, unspecified: Secondary | ICD-10-CM

## 2012-09-27 DIAGNOSIS — I10 Essential (primary) hypertension: Secondary | ICD-10-CM

## 2012-09-27 NOTE — Progress Notes (Signed)
Patient ID: Erica [name], female   DOB: 02-18-1966, 47 y.o.   MRN: [id number]  NEW PATIENT OFFICE VISIT  September 27, 2012  Teodora Medici, MD [facility] [address] [place], [place] [address]  Re: Erica [name] (DOB: 03/13/1966)  Dear Dr. Chevis Pretty:  Thank you for sending [name] for consultation regarding possible uterine fibroid embolization to treat symptomatic uterine fibroid disease. The patient is a G51, P42 47 year old African- American woman with a long standing history of uterine fibroid disease. She has noticed significant increase in menorrhagia over the last two years. Menstrual cycles are approximately every 28 days with two days of heavy bleeding and passage of clots. Cycles have been more irregular recently. She uses both pads and tampons frequently during her cycle. Last menstrual cycle was 09/26/12. There are associated bulk symptoms of pelvic bloating and pain as well as low back pain. She does urinate frequently and awakens 3-4 times a night to urinate. She is on a diuretic for hypertension. She has had some premenopausal symptoms of hot flashes.  The patient has never had any previous fibroid therapy. She is not on oral contraceptives. She has had some remote yeast infections. Pap smear on 08/31/12 was negative. Endometrial biopsy on 09/07/12 showed no evidence of atypia, hyperplasia or malignancy.  Past Medical History: 1. Hypertension. Patient's primary care physician is Dr. Cain Saupe. 2. History of removal of colonic polyps.  Medications: HCTZ 25 mg daily.  Allergies: No known drug allergies.  Social History: The patient is single. She lives in [place]. 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 She denies alcohol or tobacco use.  Family History: The patient's mother died two months ago from transitional cell carcinoma of the bladder and also had a  history of lymphoma. The patient's sister died at age 8 from breast cancer. The patient's mother also had a history of heart disease and hypertension. The patient's sister has a history of hypertension.  Page 2 Re: Erica [name] (DOB: 10/22/1965)  Review of Systems: General: 5'7" in height. 156 pounds in weight. No fever or chills. No recent weight change. Ears, nose, throat: No abnormalities. Vision: The patient wears contact lenses and reading glasses. Gastrointestinal: History of irritable bowel syndrome. No nausea or vomiting. Genitourinary: Increased urinary frequency. Cardiovascular: No chest pain or palpitations. Musculoskeletal: History of bursitis of the hips. Neurologic: No neurologic symptoms. Respiratory: No cough or shortness of breath. Skin: Chronic pruritis.  Labs: The patient's hemoglobin was 12.5 in May. I currently do not have any other labs available.  Imaging: Office ultrasound is dated 09/07/12. Uterine volume is estimated to be 548 ml. The ovaries were unremarkable. A total of eight fibroids are documented. The largest measures 4.6 cm in an intramural location. Two appear predominantly subserosal or pedunculated by ultrasound.  Assessment: I met with Erica [name] and discussed treatment options for uterine fibroid disease, including conservative management, uterine fibroid embolization and hysterectomy. The patient's menorrhagia and bulk symptoms have been worsening over the last two years, likely consistent with some degree of fibroid enlargement. The uterus is moderately enlarged. The patient is interested in uterine fibroid embolization due to a shorter recovery period and desire not to currently undergo hysterectomy.  Details of fibroid embolization were discussed with the patient, including risks, benefits and outcomes. The patient will require pelvic MRI prior to the procedure to determine exact nature of fibroid morphology, fibroid enhancement pattern and  to determine if fibroid morphology is amenable to embolization. We will schedule an outpatient pelvic MRI with and without gadolinium. If the patient is a potential candidate for embolization, she would like to proceed with the scheduling process after the MRI is performed.  Thank you again for referring Erica [name] and allowing me to participate in her care.  Sincerely,  Irish Lack, MD Dortha Schwalbe  CCain Saupe, MD

## 2012-09-28 LAB — CREATININE WITH EST GFR
Creat: 0.83 mg/dL (ref 0.50–1.10)
Creat: 0.83 mg/dL (ref 0.50–1.10)
GFR, Est African American: >89 mL/min
GFR, Est African American: >89 mL/min
GFR, Est Non African American: 84 mL/min
GFR, Est Non African American: 84 mL/min

## 2012-09-28 LAB — BUN: BUN: 16 mg/dL (ref 6–23)

## 2012-10-09 ENCOUNTER — Ambulatory Visit
Admission: RE | Admit: 2012-10-09 | Discharge: 2012-10-09 | Disposition: A | Discharge status: Home / Self Care | Payer: BC Managed Care – PPO | Source: Ambulatory Visit | Attending: Gynecology | Admitting: Gynecology

## 2012-10-09 ENCOUNTER — Telehealth: Payer: Self-pay | Admitting: Emergency Medicine

## 2012-10-09 DIAGNOSIS — D259 Leiomyoma of uterus, unspecified: Secondary | ICD-10-CM

## 2012-10-09 MED ORDER — GADOBENATE DIMEGLUMINE 529 MG/ML IV SOLN
15.0000 mL | Freq: Once | INTRAVENOUS | Status: AC | PRN
  Administered 2012-10-09: 15 mL via INTRAVENOUS

## 2012-10-09 NOTE — Telephone Encounter (Signed)
ERROR

## 2012-10-16 ENCOUNTER — Telehealth: Payer: Self-pay | Admitting: Emergency Medicine

## 2012-10-16 NOTE — Telephone Encounter (Signed)
LM TO CALL BACK ABOUT MRI/UAE  1400- PT CALLED BACK AND IS READY TO SET UP PROCEDURE.  WILL CK WITH INS. FOR AUTHO AND SEND EVERYTHING TO TINA AT [facility] -IR TO CALL PT WITH DATE/TIME.

## 2012-10-18 ENCOUNTER — Other Ambulatory Visit (HOSPITAL_COMMUNITY): Payer: Self-pay | Admitting: Interventional Radiology

## 2012-10-18 ENCOUNTER — Telehealth: Payer: Self-pay | Admitting: Emergency Medicine

## 2012-10-18 DIAGNOSIS — D219 Benign neoplasm of connective and other soft tissue, unspecified: Secondary | ICD-10-CM

## 2012-10-18 NOTE — Telephone Encounter (Signed)
CALLED PT TO MAKE HER AWARE THAT NO AUTHO IS NEEDED FROM HER INS. FOR THE [place] PROCEDURE. TINA AT [facility] -IR TO CONTACT HER

## 2012-10-27 ENCOUNTER — Other Ambulatory Visit: Payer: Self-pay | Admitting: Radiology

## 2012-10-31 ENCOUNTER — Encounter (HOSPITAL_COMMUNITY): Payer: Self-pay | Admitting: Pharmacy Technician

## 2012-11-02 ENCOUNTER — Observation Stay (HOSPITAL_COMMUNITY)
Admission: RE | Admit: 2012-11-02 | Discharge: 2012-11-03 | Disposition: A | Discharge status: Home / Self Care | Payer: BC Managed Care – PPO | Source: Ambulatory Visit | Attending: Interventional Radiology | Admitting: Interventional Radiology

## 2012-11-02 ENCOUNTER — Encounter (HOSPITAL_COMMUNITY): Payer: Self-pay

## 2012-11-02 ENCOUNTER — Other Ambulatory Visit (HOSPITAL_COMMUNITY): Payer: Self-pay | Admitting: Interventional Radiology

## 2012-11-02 ENCOUNTER — Ambulatory Visit (HOSPITAL_COMMUNITY)
Admission: RE | Admit: 2012-11-02 | Discharge: 2012-11-02 | Disposition: A | Discharge status: Home / Self Care | Payer: BC Managed Care – PPO | Source: Ambulatory Visit | Attending: Interventional Radiology | Admitting: Interventional Radiology

## 2012-11-02 VITALS — BP 117/52 | HR 97 | Temp 98.9°F | Resp 16 | Ht 67.0 in | Wt 159.0 lb

## 2012-11-02 DIAGNOSIS — D251 Intramural leiomyoma of uterus: Principal | ICD-10-CM | POA: Insufficient documentation

## 2012-11-02 DIAGNOSIS — Z860100 Personal history of colon polyps, unspecified: Secondary | ICD-10-CM | POA: Insufficient documentation

## 2012-11-02 DIAGNOSIS — I10 Essential (primary) hypertension: Secondary | ICD-10-CM | POA: Insufficient documentation

## 2012-11-02 DIAGNOSIS — K219 Gastro-esophageal reflux disease without esophagitis: Secondary | ICD-10-CM | POA: Insufficient documentation

## 2012-11-02 DIAGNOSIS — R5381 Other malaise: Secondary | ICD-10-CM | POA: Insufficient documentation

## 2012-11-02 DIAGNOSIS — D252 Subserosal leiomyoma of uterus: Secondary | ICD-10-CM | POA: Insufficient documentation

## 2012-11-02 DIAGNOSIS — D219 Benign neoplasm of connective and other soft tissue, unspecified: Secondary | ICD-10-CM

## 2012-11-02 DIAGNOSIS — N920 Excessive and frequent menstruation with regular cycle: Secondary | ICD-10-CM | POA: Insufficient documentation

## 2012-11-02 DIAGNOSIS — K449 Diaphragmatic hernia without obstruction or gangrene: Secondary | ICD-10-CM | POA: Insufficient documentation

## 2012-11-02 DIAGNOSIS — R112 Nausea with vomiting, unspecified: Secondary | ICD-10-CM | POA: Insufficient documentation

## 2012-11-02 DIAGNOSIS — K589 Irritable bowel syndrome without diarrhea: Secondary | ICD-10-CM | POA: Insufficient documentation

## 2012-11-02 DIAGNOSIS — Z800 Family history of malignant neoplasm of digestive organs: Secondary | ICD-10-CM | POA: Insufficient documentation

## 2012-11-02 DIAGNOSIS — Z79899 Other long term (current) drug therapy: Secondary | ICD-10-CM | POA: Insufficient documentation

## 2012-11-02 DIAGNOSIS — R109 Unspecified abdominal pain: Secondary | ICD-10-CM | POA: Insufficient documentation

## 2012-11-02 LAB — BASIC METABOLIC PANEL
BUN: 18 mg/dL (ref 6–23)
CO2: 27 mEq/L (ref 19–32)
Calcium: 9.5 mg/dL (ref 8.4–10.5)
Chloride: 101 mEq/L (ref 96–112)
Creatinine, Ser: 0.84 mg/dL (ref 0.50–1.10)
GFR calc Af Amer: >90 mL/min (ref >90)
GFR calc non Af Amer: 81 mL/min — ABNORMAL LOW (ref >90)
Glucose, Bld: 92 mg/dL (ref 70–99)
Potassium: 4.3 mEq/L (ref 3.5–5.1)
Sodium: 138 mEq/L (ref 135–145)

## 2012-11-02 LAB — CBC
HCT: 31.5 % — ABNORMAL LOW (ref 36.0–46.0)
Hemoglobin: 10.1 g/dL — ABNORMAL LOW (ref 12.0–15.0)
MCH: 25.4 pg — ABNORMAL LOW (ref 26.0–34.0)
MCHC: 32.1 g/dL (ref 30.0–36.0)
MCV: 79.3 fL (ref 78.0–100.0)
Platelets: 239 10*3/uL (ref 150–400)
RBC: 3.97 MIL/uL (ref 3.87–5.11)
RDW: 14.2 % (ref 11.5–15.5)
WBC: 4 10*3/uL (ref 4.0–10.5)

## 2012-11-02 LAB — PROTIME-INR
INR: 0.91 (ref 0.00–1.49)
Prothrombin Time: 12.1 seconds (ref 11.6–15.2)

## 2012-11-02 LAB — APTT: aPTT: 30 seconds (ref 24–37)

## 2012-11-02 LAB — HCG, SERUM, QUALITATIVE: Preg, Serum: NEGATIVE

## 2012-11-02 MED ORDER — DIPHENHYDRAMINE HCL 12.5 MG/5ML PO ELIX
12.5000 mg | ORAL_SOLUTION | Freq: Four times a day (QID) | ORAL | Status: DC | PRN
  Filled 2012-11-02: qty 5

## 2012-11-02 MED ORDER — PROMETHAZINE HCL 25 MG RE SUPP: 25.0000 mg | Freq: Three times a day (TID) | RECTAL | Status: DC | PRN

## 2012-11-02 MED ORDER — SODIUM CHLORIDE 0.9 % IV SOLN: 250.0000 mL | INTRAVENOUS | Status: DC | PRN

## 2012-11-02 MED ORDER — KETOROLAC TROMETHAMINE 30 MG/ML IJ SOLN
INTRAMUSCULAR | Status: AC
  Filled 2012-11-02: qty 1

## 2012-11-02 MED ORDER — ONDANSETRON HCL 4 MG/2ML IJ SOLN: 4.0000 mg | Freq: Four times a day (QID) | INTRAMUSCULAR | Status: DC | PRN

## 2012-11-02 MED ORDER — SODIUM CHLORIDE 0.9 % IJ SOLN
3.0000 mL | Freq: Two times a day (BID) | INTRAMUSCULAR | Status: DC
  Administered 2012-11-03: 3 mL via INTRAVENOUS

## 2012-11-02 MED ORDER — CEFAZOLIN SODIUM-DEXTROSE 2-3 GM-% IV SOLR
2.0000 g | INTRAVENOUS | Status: AC
  Administered 2012-11-02: 2 g via INTRAVENOUS
  Filled 2012-11-02: qty 50

## 2012-11-02 MED ORDER — MIDAZOLAM HCL 2 MG/2ML IJ SOLN
INTRAMUSCULAR | Status: AC
  Filled 2012-11-02: qty 6

## 2012-11-02 MED ORDER — IOHEXOL 300 MG/ML  SOLN
80.0000 mL | Freq: Once | INTRAMUSCULAR | Status: AC | PRN
  Administered 2012-11-02: 80 mL via INTRA_ARTERIAL

## 2012-11-02 MED ORDER — HYDROMORPHONE HCL PF 2 MG/ML IJ SOLN
INTRAMUSCULAR | Status: AC
  Filled 2012-11-02: qty 1

## 2012-11-02 MED ORDER — SODIUM CHLORIDE 0.9 % IJ SOLN: 3.0000 mL | INTRAMUSCULAR | Status: DC | PRN

## 2012-11-02 MED ORDER — DOCUSATE SODIUM 100 MG PO CAPS
100.0000 mg | ORAL_CAPSULE | Freq: Two times a day (BID) | ORAL | Status: DC
  Administered 2012-11-02 – 2012-11-03 (×2): 100 mg via ORAL
  Filled 2012-11-02 (×4): qty 1

## 2012-11-02 MED ORDER — KETOROLAC TROMETHAMINE 30 MG/ML IJ SOLN
30.0000 mg | INTRAMUSCULAR | Status: AC
  Administered 2012-11-02: 30 mg via INTRAVENOUS

## 2012-11-02 MED ORDER — HYDROCODONE-ACETAMINOPHEN 10-325 MG PO TABS
1.0000 | ORAL_TABLET | ORAL | Status: DC | PRN
  Administered 2012-11-02 – 2012-11-03 (×3): 1 via ORAL
  Filled 2012-11-02: qty 2
  Filled 2012-11-02 (×2): qty 1
  Filled 2012-11-02: qty 2

## 2012-11-02 MED ORDER — HYDROMORPHONE 0.3 MG/ML IV SOLN
INTRAVENOUS | Status: DC
  Administered 2012-11-02: 12:00:00 via INTRAVENOUS
  Administered 2012-11-02: 0.9 mg via INTRAVENOUS
  Administered 2012-11-02: 3.3 mg via INTRAVENOUS
  Administered 2012-11-02: 0.6 mg via INTRAVENOUS
  Filled 2012-11-02: qty 25

## 2012-11-02 MED ORDER — DEXAMETHASONE SODIUM PHOSPHATE 10 MG/ML IJ SOLN
4.0000 mg | Freq: Once | INTRAMUSCULAR | Status: AC
  Administered 2012-11-02: 4 mg via INTRAVENOUS

## 2012-11-02 MED ORDER — DIPHENHYDRAMINE HCL 50 MG/ML IJ SOLN: 12.5000 mg | Freq: Four times a day (QID) | INTRAMUSCULAR | Status: DC | PRN

## 2012-11-02 MED ORDER — SODIUM CHLORIDE 0.9 % IJ SOLN: 9.0000 mL | INTRAMUSCULAR | Status: DC | PRN

## 2012-11-02 MED ORDER — FENTANYL CITRATE 0.05 MG/ML IJ SOLN
INTRAMUSCULAR | Status: AC | PRN
  Administered 2012-11-02: 25 ug via INTRAVENOUS
  Administered 2012-11-02: 50 ug via INTRAVENOUS
  Administered 2012-11-02: 100 ug via INTRAVENOUS
  Administered 2012-11-02: 50 ug via INTRAVENOUS

## 2012-11-02 MED ORDER — DEXAMETHASONE SODIUM PHOSPHATE 4 MG/ML IJ SOLN
4.0000 mg | Freq: Three times a day (TID) | INTRAMUSCULAR | Status: AC
  Administered 2012-11-02 – 2012-11-03 (×3): 4 mg via INTRAVENOUS
  Filled 2012-11-02: qty 0.4
  Filled 2012-11-02: qty 1
  Filled 2012-11-02: qty 0.4
  Filled 2012-11-02: qty 1

## 2012-11-02 MED ORDER — NALOXONE HCL 0.4 MG/ML IJ SOLN: 0.4000 mg | INTRAMUSCULAR | Status: DC | PRN

## 2012-11-02 MED ORDER — LIDOCAINE HCL 1 % IJ SOLN
INTRAMUSCULAR | Status: DC
Start: 2012-11-02 — End: 2012-11-03
  Filled 2012-11-02: qty 20

## 2012-11-02 MED ORDER — MIDAZOLAM HCL 2 MG/2ML IJ SOLN
INTRAMUSCULAR | Status: AC | PRN
  Administered 2012-11-02: 0.5 mg via INTRAVENOUS
  Administered 2012-11-02 (×4): 1 mg via INTRAVENOUS

## 2012-11-02 MED ORDER — PROMETHAZINE HCL 25 MG PO TABS
25.0000 mg | ORAL_TABLET | Freq: Three times a day (TID) | ORAL | Status: DC | PRN
  Administered 2012-11-03: 25 mg via ORAL
  Filled 2012-11-02: qty 1

## 2012-11-02 MED ORDER — SODIUM CHLORIDE 0.9 % IV SOLN: INTRAVENOUS | Status: DC

## 2012-11-02 MED ORDER — DEXAMETHASONE SODIUM PHOSPHATE 10 MG/ML IJ SOLN
INTRAMUSCULAR | Status: AC
  Filled 2012-11-02: qty 1

## 2012-11-02 MED ORDER — HYDROCHLOROTHIAZIDE 25 MG PO TABS
25.0000 mg | ORAL_TABLET | Freq: Every evening | ORAL | Status: DC
  Filled 2012-11-02 (×2): qty 1

## 2012-11-02 MED ORDER — FENTANYL CITRATE 0.05 MG/ML IJ SOLN
INTRAMUSCULAR | Status: AC
  Filled 2012-11-02: qty 6

## 2012-11-02 MED ORDER — ONDANSETRON HCL 4 MG/2ML IJ SOLN
4.0000 mg | Freq: Four times a day (QID) | INTRAMUSCULAR | Status: DC | PRN
  Administered 2012-11-02 (×2): 4 mg via INTRAVENOUS
  Filled 2012-11-02 (×3): qty 2

## 2012-11-02 MED ORDER — KETOROLAC TROMETHAMINE 30 MG/ML IJ SOLN
30.0000 mg | Freq: Four times a day (QID) | INTRAMUSCULAR | Status: DC
  Administered 2012-11-02 – 2012-11-03 (×2): 30 mg via INTRAVENOUS
  Filled 2012-11-02 (×7): qty 1

## 2012-11-02 NOTE — H&P (Signed)
Erica [name] is an 47 y.o. female.   Chief Complaint: heavy menstrual periods, fibroids HPI: Patient with symptomatic uterine fibroids presents today for bilateral uterine artery embolization.  Past Medical History  Diagnosis Date  . Fibroids   . Essential hypertension, benign   . Hx of adenomatous colonic polyps   . GERD (gastroesophageal reflux disease)   . HH (hiatus hernia)   . IBS (irritable bowel syndrome)     Past Surgical History  Procedure Laterality Date  . Uterine polypectomy      Family History  Problem Relation Age of Onset  . Hodgkin's lymphoma Mother   . Alzheimer's disease Father   . Colon cancer Maternal Grandfather   . Stroke Paternal Grandfather    Social History:  reports that she has never smoked. She has never used smokeless tobacco. She reports that she does not drink alcohol or use illicit drugs.  Allergies: No Known Allergies  Current outpatient prescriptions:hydrochlorothiazide (HYDRODIURIL) 25 MG tablet, Take 25 mg by mouth every evening. , Disp: , Rfl: ;  Multiple Vitamin (MULTIVITAMIN WITH MINERALS) TABS, Take 1 tablet by mouth daily., Disp: , Rfl: ;  NON FORMULARY, Take 2 capsules by mouth daily. Ultra Hair, Disp: , Rfl: ;  polyvinyl alcohol (LIQUIFILM TEARS) 1.4 % ophthalmic solution, Place 1 drop into both eyes daily as needed (dry eyes)., Disp: , Rfl:  Current facility-administered medications:0.9 %  sodium chloride infusion, , Intravenous, Continuous, Erica Kevin Lilie Vezina, PA-C;  ceFAZolin (ANCEF) IVPB 2 g/50 mL premix, 2 g, Intravenous, On Call, Erica Kevin Shakti Fleer, PA-C;  ketorolac (TORADOL) 30 MG/ML injection 30 mg, 30 mg, Intravenous, On Call, Erica Jeananne Rama, PA-C   Results for orders placed during the hospital encounter of 11/02/12 (from the past 48 hour(s))  CBC     Status: Abnormal   Collection Time    11/02/12  8:00 AM      Result Value Range   WBC 4.0  4.0 - 10.5 K/uL   RBC 3.97  3.87 - 5.11 MIL/uL   Hemoglobin 10.1 (*) 12.0 - 15.0 g/dL   HCT 04.5 (*) 40.9 - 81.1 %   MCV 79.3  78.0 - 100.0 fL   MCH 25.4 (*) 26.0 - 34.0 pg   MCHC 32.1  30.0 - 36.0 g/dL   RDW 91.4  78.2 - 95.6 %   Platelets 239  150 - 400 K/uL   11/02/12 BMP/PT/INR  pending  Review of Systems  Constitutional: Negative for fever and chills.  Respiratory: Negative for cough and shortness of breath.   Cardiovascular: Negative for chest pain.  Gastrointestinal: Positive for heartburn. Negative for nausea, vomiting and abdominal pain.  Genitourinary: Positive for frequency. Negative for dysuria and hematuria.       Menorrhagia; occ bloating, pelvic pain, LBP  Neurological: Negative for headaches.    Blood pressure 116/64, pulse 67, temperature 98.4 F (36.9 C), temperature source Oral, resp. rate 18, height 5' 7.5" (1.715 m), weight 159 lb (72.122 kg), SpO2 100.00%. Physical Exam  Constitutional: She is oriented to person, place, and time. She appears well-developed and well-nourished.  Cardiovascular: Normal rate and regular rhythm.   Respiratory: Effort normal and breath sounds normal.  GI: Soft. Bowel sounds are normal. There is no tenderness.  Fibroid uterus  Musculoskeletal: Normal range of motion. She exhibits no edema.  Neurological: She is alert and oriented to person, place, and time.     Assessment/Plan Pt with hx of symptomatic uterine fibroids. Plan is for bilateral uterine artery  embolization today followed by overnight observation for pain control/hemodynamic monitoring. Details/risks of procedure Erica/w pt with her understanding and consent.  Erica [name],Erica KEVIN 11/02/2012, 8:18 AM

## 2012-11-02 NOTE — Progress Notes (Signed)
Subjective: Pt c/o pelvic cramping, occ nausea  Objective: Vital signs in last 24 hours: Temp:  [97 F (36.1 C)-98.4 F (36.9 C)] 97 F (36.1 C) (07/24 1500) Pulse Rate:  [67-93] 90 (07/24 1500) Resp:  [8-18] 14 (07/24 1500) BP: (94-131)/(62-91) 111/66 mmHg (07/24 1500) SpO2:  [99 %-100 %] 100 % (07/24 1500) Weight:  [159 lb (72.122 kg)] 159 lb (72.122 kg) (07/24 0731)    Intake/Output from previous day:   Intake/Output this shift:    Pt awake but sl lethargic; abd- soft, mildly tender pelvic region; rt groin puncture site ok,NT  Lab Results:   Recent Labs  11/02/12 0800  WBC 4.0  HGB 10.1*  HCT 31.5*  PLT 239   BMET  Recent Labs  11/02/12 0800  NA 138  K 4.3  CL 101  CO2 27  GLUCOSE 92  BUN 18  CREATININE 0.84  CALCIUM 9.5   PT/INR  Recent Labs  11/02/12 0800  LABPROT 12.1  INR 0.91   ABG No results found for this basename: PHART, PCO2, PO2, HCO3,  in the last 72 hours  Studies/Results: Ir Angiogram Pelvis Selective Or Supraselective  11/02/2012   *RADIOLOGY REPORT*  Clinical Data: Symptomatic uterine fibroids with severe menorrhagia.  The patient presents for uterine fibroid embolization procedure.  1.  ULTRASOUND GUIDANCE FOR VASCULAR ACCESS OF THE RIGHT COMMON FEMORAL ARTERY 2.  SELECTIVE PELVIC ARTERIOGRAPHY OF THE LEFT INTERNAL ILIAC ARTERY 3.  ADDITIONAL SELECTIVE ARTERIOGRAPHY OF THE LEFT UTERINE ARTERY 4.  TRANSCATHETER EMBOLIZATION OF THE LEFT UTERINE ARTERY 5.  SELECTIVE PELVIC ARTERIOGRAPHY OF THE RIGHT INTERNAL ILIAC ARTERY 6.  SELECTIVE ARTERIOGRAPHY OF THE RIGHT UTERINE ARTERY 7.  TRANSCATHETER EMBOLIZATION OF THE RIGHT UTERINE ARTERY  Sedation: 4.5 mg IV Versed; 225 mcg IV Fentanyl.  Total Moderate Sedation Time: 90 minutes.  Contrast Volume: 80 ml Omnipaque-300  Additional Medications: 30 mg IV Toradol, 2 grams IV Ancef and 4 mg IV Decadron immediately prior to the procedure.  As antibiotic prophylaxis, Ancef was ordered pre-procedure  and administered intravenously within one hour of incision.  Fluoroscropy Time: 21 minutes.  Procedure:  The procedure, risks, benefits, and alternatives were explained to the patient.  Questions regarding the procedure were encouraged and answered.  The patient understands and consents to the procedure.  The right groin was prepped with Betadine in a sterile fashion, and a sterile drape was applied covering the operative field.  A sterile gown and sterile gloves were used for the procedure.  Local anesthesia was provided with 1% Lidocaine.  After small skin incision, a 21 gauge needle was advanced into the right common femoral artery under direct ultrasound guidance. Ultrasound image documentation was performed.  After establishing guide wire access, a 5-French sheath was placed.  A 5 Fr diagnostic catheter was advanced over a guidewire into the distal abdominal aorta.  The catheter was then used to selectively catheterize the left common iliac artery followed by the left internal iliac artery.  Selective arteriography was performed.  A 2.8 Fr coaxial microcatheter was then introduced through the diagnostic catheter and advanced into the left uterine artery over a guide wire utilizing roadmapping technique.  Selective arteriography of the left uterine artery was performed through the microcatheter.  Left uterine artery embolization was then performed with installation of microsphere particles.  Follow-up arteriography was performed after embolization.  The microcatheter was removed.  The diagnostic catheter was then retracted and used to selectively catheterize the right internal iliac artery.  Selective arteriography was  performed.  The microcatheter was then reintroduced and advanced into the right uterine artery over a guidewire utilizing roadmapping technique. Selective arteriography of the right uterine artery was then performed through the microcatheter.  Right uterine artery embolization was then performed  with installation of microsphere particles.  Follow-up arteriography was performed after embolization.  Right femoral arteriotomy hemostasis: Cordis ExoSeal device.  Complications: None  Findings:  Bilateral uterine arteriography shows multiple enlarged hypervascular trunks supplying uterine fibroids.  Left uterine artery embolization was performed utilizing one vial of 500 - 700 micron sized Embosphere particles.  Completion arteriography demonstrates adequate occlusion of branches supplying uterine fibroids.  Right uterine artery embolization was performed utilizing one and a half vials of  500 - 700 micron sized Embosphere particles. Completion arteriography demonstrates adequate occlusion of branches supplying uterine fibroids.  Adequate hemostasis was achieved at the femoral arteriotomy site.  IMPRESSION: Successful bilateral uterine artery embolization to treat symptomatic uterine fibroid disease.  Adequate occlusion of branch vessels supplying uterine fibroids was achieved with microsphere particle embolization.  The patient was admitted for overnight observation for treatment of post embolization symptoms.  Initial clinical follow-up will be performed in 2 weeks.   Original Report Authenticated By: Irish Lack, M.D.   Ir Angiogram Pelvis Selective Or Supraselective  11/02/2012   *RADIOLOGY REPORT*  Clinical Data: Symptomatic uterine fibroids with severe menorrhagia.  The patient presents for uterine fibroid embolization procedure.  1.  ULTRASOUND GUIDANCE FOR VASCULAR ACCESS OF THE RIGHT COMMON FEMORAL ARTERY 2.  SELECTIVE PELVIC ARTERIOGRAPHY OF THE LEFT INTERNAL ILIAC ARTERY 3.  ADDITIONAL SELECTIVE ARTERIOGRAPHY OF THE LEFT UTERINE ARTERY 4.  TRANSCATHETER EMBOLIZATION OF THE LEFT UTERINE ARTERY 5.  SELECTIVE PELVIC ARTERIOGRAPHY OF THE RIGHT INTERNAL ILIAC ARTERY 6.  SELECTIVE ARTERIOGRAPHY OF THE RIGHT UTERINE ARTERY 7.  TRANSCATHETER EMBOLIZATION OF THE RIGHT UTERINE ARTERY  Sedation: 4.5 mg IV  Versed; 225 mcg IV Fentanyl.  Total Moderate Sedation Time: 90 minutes.  Contrast Volume: 80 ml Omnipaque-300  Additional Medications: 30 mg IV Toradol, 2 grams IV Ancef and 4 mg IV Decadron immediately prior to the procedure.  As antibiotic prophylaxis, Ancef was ordered pre-procedure and administered intravenously within one hour of incision.  Fluoroscropy Time: 21 minutes.  Procedure:  The procedure, risks, benefits, and alternatives were explained to the patient.  Questions regarding the procedure were encouraged and answered.  The patient understands and consents to the procedure.  The right groin was prepped with Betadine in a sterile fashion, and a sterile drape was applied covering the operative field.  A sterile gown and sterile gloves were used for the procedure.  Local anesthesia was provided with 1% Lidocaine.  After small skin incision, a 21 gauge needle was advanced into the right common femoral artery under direct ultrasound guidance. Ultrasound image documentation was performed.  After establishing guide wire access, a 5-French sheath was placed.  A 5 Fr diagnostic catheter was advanced over a guidewire into the distal abdominal aorta.  The catheter was then used to selectively catheterize the left common iliac artery followed by the left internal iliac artery.  Selective arteriography was performed.  A 2.8 Fr coaxial microcatheter was then introduced through the diagnostic catheter and advanced into the left uterine artery over a guide wire utilizing roadmapping technique.  Selective arteriography of the left uterine artery was performed through the microcatheter.  Left uterine artery embolization was then performed with installation of microsphere particles.  Follow-up arteriography was performed after embolization.  The microcatheter was removed.  The diagnostic catheter was then retracted and used to selectively catheterize the right internal iliac artery.  Selective arteriography was performed.   The microcatheter was then reintroduced and advanced into the right uterine artery over a guidewire utilizing roadmapping technique. Selective arteriography of the right uterine artery was then performed through the microcatheter.  Right uterine artery embolization was then performed with installation of microsphere particles.  Follow-up arteriography was performed after embolization.  Right femoral arteriotomy hemostasis: Cordis ExoSeal device.  Complications: None  Findings:  Bilateral uterine arteriography shows multiple enlarged hypervascular trunks supplying uterine fibroids.  Left uterine artery embolization was performed utilizing one vial of 500 - 700 micron sized Embosphere particles.  Completion arteriography demonstrates adequate occlusion of branches supplying uterine fibroids.  Right uterine artery embolization was performed utilizing one and a half vials of  500 - 700 micron sized Embosphere particles. Completion arteriography demonstrates adequate occlusion of branches supplying uterine fibroids.  Adequate hemostasis was achieved at the femoral arteriotomy site.  IMPRESSION: Successful bilateral uterine artery embolization to treat symptomatic uterine fibroid disease.  Adequate occlusion of branch vessels supplying uterine fibroids was achieved with microsphere particle embolization.  The patient was admitted for overnight observation for treatment of post embolization symptoms.  Initial clinical follow-up will be performed in 2 weeks.   Original Report Authenticated By: Irish Lack, M.D.   Ir Angiogram Selective Each Additional Vessel  11/02/2012   *RADIOLOGY REPORT*  Clinical Data: Symptomatic uterine fibroids with severe menorrhagia.  The patient presents for uterine fibroid embolization procedure.  1.  ULTRASOUND GUIDANCE FOR VASCULAR ACCESS OF THE RIGHT COMMON FEMORAL ARTERY 2.  SELECTIVE PELVIC ARTERIOGRAPHY OF THE LEFT INTERNAL ILIAC ARTERY 3.  ADDITIONAL SELECTIVE ARTERIOGRAPHY OF THE  LEFT UTERINE ARTERY 4.  TRANSCATHETER EMBOLIZATION OF THE LEFT UTERINE ARTERY 5.  SELECTIVE PELVIC ARTERIOGRAPHY OF THE RIGHT INTERNAL ILIAC ARTERY 6.  SELECTIVE ARTERIOGRAPHY OF THE RIGHT UTERINE ARTERY 7.  TRANSCATHETER EMBOLIZATION OF THE RIGHT UTERINE ARTERY  Sedation: 4.5 mg IV Versed; 225 mcg IV Fentanyl.  Total Moderate Sedation Time: 90 minutes.  Contrast Volume: 80 ml Omnipaque-300  Additional Medications: 30 mg IV Toradol, 2 grams IV Ancef and 4 mg IV Decadron immediately prior to the procedure.  As antibiotic prophylaxis, Ancef was ordered pre-procedure and administered intravenously within one hour of incision.  Fluoroscropy Time: 21 minutes.  Procedure:  The procedure, risks, benefits, and alternatives were explained to the patient.  Questions regarding the procedure were encouraged and answered.  The patient understands and consents to the procedure.  The right groin was prepped with Betadine in a sterile fashion, and a sterile drape was applied covering the operative field.  A sterile gown and sterile gloves were used for the procedure.  Local anesthesia was provided with 1% Lidocaine.  After small skin incision, a 21 gauge needle was advanced into the right common femoral artery under direct ultrasound guidance. Ultrasound image documentation was performed.  After establishing guide wire access, a 5-French sheath was placed.  A 5 Fr diagnostic catheter was advanced over a guidewire into the distal abdominal aorta.  The catheter was then used to selectively catheterize the left common iliac artery followed by the left internal iliac artery.  Selective arteriography was performed.  A 2.8 Fr coaxial microcatheter was then introduced through the diagnostic catheter and advanced into the left uterine artery over a guide wire utilizing roadmapping technique.  Selective arteriography of the left uterine artery was performed through the microcatheter.  Left uterine artery embolization  was then performed with  installation of microsphere particles.  Follow-up arteriography was performed after embolization.  The microcatheter was removed.  The diagnostic catheter was then retracted and used to selectively catheterize the right internal iliac artery.  Selective arteriography was performed.  The microcatheter was then reintroduced and advanced into the right uterine artery over a guidewire utilizing roadmapping technique. Selective arteriography of the right uterine artery was then performed through the microcatheter.  Right uterine artery embolization was then performed with installation of microsphere particles.  Follow-up arteriography was performed after embolization.  Right femoral arteriotomy hemostasis: Cordis ExoSeal device.  Complications: None  Findings:  Bilateral uterine arteriography shows multiple enlarged hypervascular trunks supplying uterine fibroids.  Left uterine artery embolization was performed utilizing one vial of 500 - 700 micron sized Embosphere particles.  Completion arteriography demonstrates adequate occlusion of branches supplying uterine fibroids.  Right uterine artery embolization was performed utilizing one and a half vials of  500 - 700 micron sized Embosphere particles. Completion arteriography demonstrates adequate occlusion of branches supplying uterine fibroids.  Adequate hemostasis was achieved at the femoral arteriotomy site.  IMPRESSION: Successful bilateral uterine artery embolization to treat symptomatic uterine fibroid disease.  Adequate occlusion of branch vessels supplying uterine fibroids was achieved with microsphere particle embolization.  The patient was admitted for overnight observation for treatment of post embolization symptoms.  Initial clinical follow-up will be performed in 2 weeks.   Original Report Authenticated By: Irish Lack, M.D.   Ir Angiogram Selective Each Additional Vessel  11/02/2012   *RADIOLOGY REPORT*  Clinical Data: Symptomatic uterine fibroids with  severe menorrhagia.  The patient presents for uterine fibroid embolization procedure.  1.  ULTRASOUND GUIDANCE FOR VASCULAR ACCESS OF THE RIGHT COMMON FEMORAL ARTERY 2.  SELECTIVE PELVIC ARTERIOGRAPHY OF THE LEFT INTERNAL ILIAC ARTERY 3.  ADDITIONAL SELECTIVE ARTERIOGRAPHY OF THE LEFT UTERINE ARTERY 4.  TRANSCATHETER EMBOLIZATION OF THE LEFT UTERINE ARTERY 5.  SELECTIVE PELVIC ARTERIOGRAPHY OF THE RIGHT INTERNAL ILIAC ARTERY 6.  SELECTIVE ARTERIOGRAPHY OF THE RIGHT UTERINE ARTERY 7.  TRANSCATHETER EMBOLIZATION OF THE RIGHT UTERINE ARTERY  Sedation: 4.5 mg IV Versed; 225 mcg IV Fentanyl.  Total Moderate Sedation Time: 90 minutes.  Contrast Volume: 80 ml Omnipaque-300  Additional Medications: 30 mg IV Toradol, 2 grams IV Ancef and 4 mg IV Decadron immediately prior to the procedure.  As antibiotic prophylaxis, Ancef was ordered pre-procedure and administered intravenously within one hour of incision.  Fluoroscropy Time: 21 minutes.  Procedure:  The procedure, risks, benefits, and alternatives were explained to the patient.  Questions regarding the procedure were encouraged and answered.  The patient understands and consents to the procedure.  The right groin was prepped with Betadine in a sterile fashion, and a sterile drape was applied covering the operative field.  A sterile gown and sterile gloves were used for the procedure.  Local anesthesia was provided with 1% Lidocaine.  After small skin incision, a 21 gauge needle was advanced into the right common femoral artery under direct ultrasound guidance. Ultrasound image documentation was performed.  After establishing guide wire access, a 5-French sheath was placed.  A 5 Fr diagnostic catheter was advanced over a guidewire into the distal abdominal aorta.  The catheter was then used to selectively catheterize the left common iliac artery followed by the left internal iliac artery.  Selective arteriography was performed.  A 2.8 Fr coaxial microcatheter was then  introduced through the diagnostic catheter and advanced into the left uterine artery over a guide  wire utilizing roadmapping technique.  Selective arteriography of the left uterine artery was performed through the microcatheter.  Left uterine artery embolization was then performed with installation of microsphere particles.  Follow-up arteriography was performed after embolization.  The microcatheter was removed.  The diagnostic catheter was then retracted and used to selectively catheterize the right internal iliac artery.  Selective arteriography was performed.  The microcatheter was then reintroduced and advanced into the right uterine artery over a guidewire utilizing roadmapping technique. Selective arteriography of the right uterine artery was then performed through the microcatheter.  Right uterine artery embolization was then performed with installation of microsphere particles.  Follow-up arteriography was performed after embolization.  Right femoral arteriotomy hemostasis: Cordis ExoSeal device.  Complications: None  Findings:  Bilateral uterine arteriography shows multiple enlarged hypervascular trunks supplying uterine fibroids.  Left uterine artery embolization was performed utilizing one vial of 500 - 700 micron sized Embosphere particles.  Completion arteriography demonstrates adequate occlusion of branches supplying uterine fibroids.  Right uterine artery embolization was performed utilizing one and a half vials of  500 - 700 micron sized Embosphere particles. Completion arteriography demonstrates adequate occlusion of branches supplying uterine fibroids.  Adequate hemostasis was achieved at the femoral arteriotomy site.  IMPRESSION: Successful bilateral uterine artery embolization to treat symptomatic uterine fibroid disease.  Adequate occlusion of branch vessels supplying uterine fibroids was achieved with microsphere particle embolization.  The patient was admitted for overnight observation for  treatment of post embolization symptoms.  Initial clinical follow-up will be performed in 2 weeks.   Original Report Authenticated By: Irish Lack, M.D.   Ir US Guide Vasc Access Right  11/02/2012   *RADIOLOGY REPORT*  Clinical Data: Symptomatic uterine fibroids with severe menorrhagia.  The patient presents for uterine fibroid embolization procedure.  1.  ULTRASOUND GUIDANCE FOR VASCULAR ACCESS OF THE RIGHT COMMON FEMORAL ARTERY 2.  SELECTIVE PELVIC ARTERIOGRAPHY OF THE LEFT INTERNAL ILIAC ARTERY 3.  ADDITIONAL SELECTIVE ARTERIOGRAPHY OF THE LEFT UTERINE ARTERY 4.  TRANSCATHETER EMBOLIZATION OF THE LEFT UTERINE ARTERY 5.  SELECTIVE PELVIC ARTERIOGRAPHY OF THE RIGHT INTERNAL ILIAC ARTERY 6.  SELECTIVE ARTERIOGRAPHY OF THE RIGHT UTERINE ARTERY 7.  TRANSCATHETER EMBOLIZATION OF THE RIGHT UTERINE ARTERY  Sedation: 4.5 mg IV Versed; 225 mcg IV Fentanyl.  Total Moderate Sedation Time: 90 minutes.  Contrast Volume: 80 ml Omnipaque-300  Additional Medications: 30 mg IV Toradol, 2 grams IV Ancef and 4 mg IV Decadron immediately prior to the procedure.  As antibiotic prophylaxis, Ancef was ordered pre-procedure and administered intravenously within one hour of incision.  Fluoroscropy Time: 21 minutes.  Procedure:  The procedure, risks, benefits, and alternatives were explained to the patient.  Questions regarding the procedure were encouraged and answered.  The patient understands and consents to the procedure.  The right groin was prepped with Betadine in a sterile fashion, and a sterile drape was applied covering the operative field.  A sterile gown and sterile gloves were used for the procedure.  Local anesthesia was provided with 1% Lidocaine.  After small skin incision, a 21 gauge needle was advanced into the right common femoral artery under direct ultrasound guidance. Ultrasound image documentation was performed.  After establishing guide wire access, a 5-French sheath was placed.  A 5 Fr diagnostic catheter was  advanced over a guidewire into the distal abdominal aorta.  The catheter was then used to selectively catheterize the left common iliac artery followed by the left internal iliac artery.  Selective arteriography was performed.  A  2.8 Fr coaxial microcatheter was then introduced through the diagnostic catheter and advanced into the left uterine artery over a guide wire utilizing roadmapping technique.  Selective arteriography of the left uterine artery was performed through the microcatheter.  Left uterine artery embolization was then performed with installation of microsphere particles.  Follow-up arteriography was performed after embolization.  The microcatheter was removed.  The diagnostic catheter was then retracted and used to selectively catheterize the right internal iliac artery.  Selective arteriography was performed.  The microcatheter was then reintroduced and advanced into the right uterine artery over a guidewire utilizing roadmapping technique. Selective arteriography of the right uterine artery was then performed through the microcatheter.  Right uterine artery embolization was then performed with installation of microsphere particles.  Follow-up arteriography was performed after embolization.  Right femoral arteriotomy hemostasis: Cordis ExoSeal device.  Complications: None  Findings:  Bilateral uterine arteriography shows multiple enlarged hypervascular trunks supplying uterine fibroids.  Left uterine artery embolization was performed utilizing one vial of 500 - 700 micron sized Embosphere particles.  Completion arteriography demonstrates adequate occlusion of branches supplying uterine fibroids.  Right uterine artery embolization was performed utilizing one and a half vials of  500 - 700 micron sized Embosphere particles. Completion arteriography demonstrates adequate occlusion of branches supplying uterine fibroids.  Adequate hemostasis was achieved at the femoral arteriotomy site.  IMPRESSION:  Successful bilateral uterine artery embolization to treat symptomatic uterine fibroid disease.  Adequate occlusion of branch vessels supplying uterine fibroids was achieved with microsphere particle embolization.  The patient was admitted for overnight observation for treatment of post embolization symptoms.  Initial clinical follow-up will be performed in 2 weeks.   Original Report Authenticated By: Irish Lack, M.D.   Ir Embo Tumor Organ Ischemia Infarct Inc Guide Roadmapping  11/02/2012   *RADIOLOGY REPORT*  Clinical Data: Symptomatic uterine fibroids with severe menorrhagia.  The patient presents for uterine fibroid embolization procedure.  1.  ULTRASOUND GUIDANCE FOR VASCULAR ACCESS OF THE RIGHT COMMON FEMORAL ARTERY 2.  SELECTIVE PELVIC ARTERIOGRAPHY OF THE LEFT INTERNAL ILIAC ARTERY 3.  ADDITIONAL SELECTIVE ARTERIOGRAPHY OF THE LEFT UTERINE ARTERY 4.  TRANSCATHETER EMBOLIZATION OF THE LEFT UTERINE ARTERY 5.  SELECTIVE PELVIC ARTERIOGRAPHY OF THE RIGHT INTERNAL ILIAC ARTERY 6.  SELECTIVE ARTERIOGRAPHY OF THE RIGHT UTERINE ARTERY 7.  TRANSCATHETER EMBOLIZATION OF THE RIGHT UTERINE ARTERY  Sedation: 4.5 mg IV Versed; 225 mcg IV Fentanyl.  Total Moderate Sedation Time: 90 minutes.  Contrast Volume: 80 ml Omnipaque-300  Additional Medications: 30 mg IV Toradol, 2 grams IV Ancef and 4 mg IV Decadron immediately prior to the procedure.  As antibiotic prophylaxis, Ancef was ordered pre-procedure and administered intravenously within one hour of incision.  Fluoroscropy Time: 21 minutes.  Procedure:  The procedure, risks, benefits, and alternatives were explained to the patient.  Questions regarding the procedure were encouraged and answered.  The patient understands and consents to the procedure.  The right groin was prepped with Betadine in a sterile fashion, and a sterile drape was applied covering the operative field.  A sterile gown and sterile gloves were used for the procedure.  Local anesthesia was  provided with 1% Lidocaine.  After small skin incision, a 21 gauge needle was advanced into the right common femoral artery under direct ultrasound guidance. Ultrasound image documentation was performed.  After establishing guide wire access, a 5-French sheath was placed.  A 5 Fr diagnostic catheter was advanced over a guidewire into the distal abdominal aorta.  The catheter was  then used to selectively catheterize the left common iliac artery followed by the left internal iliac artery.  Selective arteriography was performed.  A 2.8 Fr coaxial microcatheter was then introduced through the diagnostic catheter and advanced into the left uterine artery over a guide wire utilizing roadmapping technique.  Selective arteriography of the left uterine artery was performed through the microcatheter.  Left uterine artery embolization was then performed with installation of microsphere particles.  Follow-up arteriography was performed after embolization.  The microcatheter was removed.  The diagnostic catheter was then retracted and used to selectively catheterize the right internal iliac artery.  Selective arteriography was performed.  The microcatheter was then reintroduced and advanced into the right uterine artery over a guidewire utilizing roadmapping technique. Selective arteriography of the right uterine artery was then performed through the microcatheter.  Right uterine artery embolization was then performed with installation of microsphere particles.  Follow-up arteriography was performed after embolization.  Right femoral arteriotomy hemostasis: Cordis ExoSeal device.  Complications: None  Findings:  Bilateral uterine arteriography shows multiple enlarged hypervascular trunks supplying uterine fibroids.  Left uterine artery embolization was performed utilizing one vial of 500 - 700 micron sized Embosphere particles.  Completion arteriography demonstrates adequate occlusion of branches supplying uterine fibroids.   Right uterine artery embolization was performed utilizing one and a half vials of  500 - 700 micron sized Embosphere particles. Completion arteriography demonstrates adequate occlusion of branches supplying uterine fibroids.  Adequate hemostasis was achieved at the femoral arteriotomy site.  IMPRESSION: Successful bilateral uterine artery embolization to treat symptomatic uterine fibroid disease.  Adequate occlusion of branch vessels supplying uterine fibroids was achieved with microsphere particle embolization.  The patient was admitted for overnight observation for treatment of post embolization symptoms.  Initial clinical follow-up will be performed in 2 weeks.   Original Report Authenticated By: Irish Lack, M.D.    Anti-infectives: Anti-infectives   Start     Dose/Rate Route Frequency Ordered Stop   11/02/12 0730  ceFAZolin (ANCEF) IVPB 2 g/50 mL premix     2 g 100 mL/hr over 30 Minutes Intravenous On call 11/02/12 0722 11/02/12 1024      Assessment/Plan: s/p bilateral uterine artery embolization 7/24; overnight obs for pain control; hydrate  LOS: 0 days    Jaiden Dinkins,D [facility] 11/02/2012

## 2012-11-02 NOTE — ED Notes (Signed)
5Fr sheath removed from R fem artery by Dr. Fredia Sorrow.  Hemostasis achieved using Exoseal device.  R groin level 0.

## 2012-11-02 NOTE — H&P (Signed)
Agree.  For UFE today.

## 2012-11-02 NOTE — ED Notes (Signed)
R groin level 0, 3+ RDP, Gauze tegaderm drsg CDI.

## 2012-11-03 ENCOUNTER — Other Ambulatory Visit: Payer: Self-pay | Admitting: Radiology

## 2012-11-03 DIAGNOSIS — D259 Leiomyoma of uterus, unspecified: Secondary | ICD-10-CM

## 2012-11-03 LAB — CBC
HCT: 33 % — ABNORMAL LOW (ref 36.0–46.0)
Hemoglobin: 10.5 g/dL — ABNORMAL LOW (ref 12.0–15.0)
MCH: 25.2 pg — ABNORMAL LOW (ref 26.0–34.0)
MCHC: 31.8 g/dL (ref 30.0–36.0)
MCV: 79.3 fL (ref 78.0–100.0)
Platelets: 207 10*3/uL (ref 150–400)
RBC: 4.16 MIL/uL (ref 3.87–5.11)
RDW: 14.2 % (ref 11.5–15.5)
WBC: 7.5 10*3/uL (ref 4.0–10.5)

## 2012-11-03 MED ORDER — HYDROCODONE-ACETAMINOPHEN 10-325 MG PO TABS
1.0000 | ORAL_TABLET | ORAL | Status: DC | PRN
Start: 2012-11-03 — End: 2013-04-30

## 2012-11-03 MED ORDER — DSS 100 MG PO CAPS: 100.0000 mg | ORAL_CAPSULE | Freq: Two times a day (BID) | ORAL | Status: DC

## 2012-11-03 MED ORDER — PROMETHAZINE HCL 25 MG PO TABS: 25.0000 mg | ORAL_TABLET | Freq: Three times a day (TID) | ORAL | Status: DC | PRN

## 2012-11-03 NOTE — Progress Notes (Signed)
Subjective: Pt had "rough" night due to N/V/pelvic cramping ; currently ambulating in room; still with pelvic cramping but no further N/V; tolerating liquid diet; has not voided since foley removed  Objective: Vital signs in last 24 hours: Temp:  [97 F (36.1 C)-98.9 F (37.2 C)] 98.9 F (37.2 C) (07/25 0645) Pulse Rate:  [71-97] 97 (07/25 0645) Resp:  [8-17] 16 (07/25 0645) BP: (94-131)/(52-91) 117/52 mmHg (07/25 0645) SpO2:  [99 %-100 %] 99 % (07/25 0645) Weight:  [159 lb (72.122 kg)] 159 lb (72.122 kg) (07/24 1400) Last BM Date:  (PTA)  Intake/Output from previous day: 07/24 0701 - 07/25 0700 In: 0  Out: 250 [Urine:250] Intake/Output this shift:    Pt awake/alert; feels a little weak; chest- CTA bilat; heart- RRR; abd- soft, +BS,NT, fibroid uterus; ext- FROM, no edema, intact distal pulses; rt groin puncture site clean, dry, NT,no hematoma  Lab Results:   Recent Labs  11/02/12 0800 11/03/12 0345  WBC 4.0 7.5  HGB 10.1* 10.5*  HCT 31.5* 33.0*  PLT 239 207   BMET  Recent Labs  11/02/12 0800  NA 138  K 4.3  CL 101  CO2 27  GLUCOSE 92  BUN 18  CREATININE 0.84  CALCIUM 9.5   PT/INR  Recent Labs  11/02/12 0800  LABPROT 12.1  INR 0.91   ABG No results found for this basename: PHART, PCO2, PO2, HCO3,  in the last 72 hours  Studies/Results: Ir Angiogram Pelvis Selective Or Supraselective  11/02/2012   *RADIOLOGY REPORT*  Clinical Data: Symptomatic uterine fibroids with severe menorrhagia.  The patient presents for uterine fibroid embolization procedure.  1.  ULTRASOUND GUIDANCE FOR VASCULAR ACCESS OF THE RIGHT COMMON FEMORAL ARTERY 2.  SELECTIVE PELVIC ARTERIOGRAPHY OF THE LEFT INTERNAL ILIAC ARTERY 3.  ADDITIONAL SELECTIVE ARTERIOGRAPHY OF THE LEFT UTERINE ARTERY 4.  TRANSCATHETER EMBOLIZATION OF THE LEFT UTERINE ARTERY 5.  SELECTIVE PELVIC ARTERIOGRAPHY OF THE RIGHT INTERNAL ILIAC ARTERY 6.  SELECTIVE ARTERIOGRAPHY OF THE RIGHT UTERINE ARTERY 7.   TRANSCATHETER EMBOLIZATION OF THE RIGHT UTERINE ARTERY  Sedation: 4.5 mg IV Versed; 225 mcg IV Fentanyl.  Total Moderate Sedation Time: 90 minutes.  Contrast Volume: 80 ml Omnipaque-300  Additional Medications: 30 mg IV Toradol, 2 grams IV Ancef and 4 mg IV Decadron immediately prior to the procedure.  As antibiotic prophylaxis, Ancef was ordered pre-procedure and administered intravenously within one hour of incision.  Fluoroscropy Time: 21 minutes.  Procedure:  The procedure, risks, benefits, and alternatives were explained to the patient.  Questions regarding the procedure were encouraged and answered.  The patient understands and consents to the procedure.  The right groin was prepped with Betadine in a sterile fashion, and a sterile drape was applied covering the operative field.  A sterile gown and sterile gloves were used for the procedure.  Local anesthesia was provided with 1% Lidocaine.  After small skin incision, a 21 gauge needle was advanced into the right common femoral artery under direct ultrasound guidance. Ultrasound image documentation was performed.  After establishing guide wire access, a 5-French sheath was placed.  A 5 Fr diagnostic catheter was advanced over a guidewire into the distal abdominal aorta.  The catheter was then used to selectively catheterize the left common iliac artery followed by the left internal iliac artery.  Selective arteriography was performed.  A 2.8 Fr coaxial microcatheter was then introduced through the diagnostic catheter and advanced into the left uterine artery over a guide wire utilizing roadmapping technique.  Selective  arteriography of the left uterine artery was performed through the microcatheter.  Left uterine artery embolization was then performed with installation of microsphere particles.  Follow-up arteriography was performed after embolization.  The microcatheter was removed.  The diagnostic catheter was then retracted and used to selectively  catheterize the right internal iliac artery.  Selective arteriography was performed.  The microcatheter was then reintroduced and advanced into the right uterine artery over a guidewire utilizing roadmapping technique. Selective arteriography of the right uterine artery was then performed through the microcatheter.  Right uterine artery embolization was then performed with installation of microsphere particles.  Follow-up arteriography was performed after embolization.  Right femoral arteriotomy hemostasis: Cordis ExoSeal device.  Complications: None  Findings:  Bilateral uterine arteriography shows multiple enlarged hypervascular trunks supplying uterine fibroids.  Left uterine artery embolization was performed utilizing one vial of 500 - 700 micron sized Embosphere particles.  Completion arteriography demonstrates adequate occlusion of branches supplying uterine fibroids.  Right uterine artery embolization was performed utilizing one and a half vials of  500 - 700 micron sized Embosphere particles. Completion arteriography demonstrates adequate occlusion of branches supplying uterine fibroids.  Adequate hemostasis was achieved at the femoral arteriotomy site.  IMPRESSION: Successful bilateral uterine artery embolization to treat symptomatic uterine fibroid disease.  Adequate occlusion of branch vessels supplying uterine fibroids was achieved with microsphere particle embolization.  The patient was admitted for overnight observation for treatment of post embolization symptoms.  Initial clinical follow-up will be performed in 2 weeks.   Original Report Authenticated By: Irish Lack, M.D.   Ir Angiogram Pelvis Selective Or Supraselective  11/02/2012   *RADIOLOGY REPORT*  Clinical Data: Symptomatic uterine fibroids with severe menorrhagia.  The patient presents for uterine fibroid embolization procedure.  1.  ULTRASOUND GUIDANCE FOR VASCULAR ACCESS OF THE RIGHT COMMON FEMORAL ARTERY 2.  SELECTIVE PELVIC  ARTERIOGRAPHY OF THE LEFT INTERNAL ILIAC ARTERY 3.  ADDITIONAL SELECTIVE ARTERIOGRAPHY OF THE LEFT UTERINE ARTERY 4.  TRANSCATHETER EMBOLIZATION OF THE LEFT UTERINE ARTERY 5.  SELECTIVE PELVIC ARTERIOGRAPHY OF THE RIGHT INTERNAL ILIAC ARTERY 6.  SELECTIVE ARTERIOGRAPHY OF THE RIGHT UTERINE ARTERY 7.  TRANSCATHETER EMBOLIZATION OF THE RIGHT UTERINE ARTERY  Sedation: 4.5 mg IV Versed; 225 mcg IV Fentanyl.  Total Moderate Sedation Time: 90 minutes.  Contrast Volume: 80 ml Omnipaque-300  Additional Medications: 30 mg IV Toradol, 2 grams IV Ancef and 4 mg IV Decadron immediately prior to the procedure.  As antibiotic prophylaxis, Ancef was ordered pre-procedure and administered intravenously within one hour of incision.  Fluoroscropy Time: 21 minutes.  Procedure:  The procedure, risks, benefits, and alternatives were explained to the patient.  Questions regarding the procedure were encouraged and answered.  The patient understands and consents to the procedure.  The right groin was prepped with Betadine in a sterile fashion, and a sterile drape was applied covering the operative field.  A sterile gown and sterile gloves were used for the procedure.  Local anesthesia was provided with 1% Lidocaine.  After small skin incision, a 21 gauge needle was advanced into the right common femoral artery under direct ultrasound guidance. Ultrasound image documentation was performed.  After establishing guide wire access, a 5-French sheath was placed.  A 5 Fr diagnostic catheter was advanced over a guidewire into the distal abdominal aorta.  The catheter was then used to selectively catheterize the left common iliac artery followed by the left internal iliac artery.  Selective arteriography was performed.  A 2.8 Fr coaxial microcatheter was then  introduced through the diagnostic catheter and advanced into the left uterine artery over a guide wire utilizing roadmapping technique.  Selective arteriography of the left uterine artery was  performed through the microcatheter.  Left uterine artery embolization was then performed with installation of microsphere particles.  Follow-up arteriography was performed after embolization.  The microcatheter was removed.  The diagnostic catheter was then retracted and used to selectively catheterize the right internal iliac artery.  Selective arteriography was performed.  The microcatheter was then reintroduced and advanced into the right uterine artery over a guidewire utilizing roadmapping technique. Selective arteriography of the right uterine artery was then performed through the microcatheter.  Right uterine artery embolization was then performed with installation of microsphere particles.  Follow-up arteriography was performed after embolization.  Right femoral arteriotomy hemostasis: Cordis ExoSeal device.  Complications: None  Findings:  Bilateral uterine arteriography shows multiple enlarged hypervascular trunks supplying uterine fibroids.  Left uterine artery embolization was performed utilizing one vial of 500 - 700 micron sized Embosphere particles.  Completion arteriography demonstrates adequate occlusion of branches supplying uterine fibroids.  Right uterine artery embolization was performed utilizing one and a half vials of  500 - 700 micron sized Embosphere particles. Completion arteriography demonstrates adequate occlusion of branches supplying uterine fibroids.  Adequate hemostasis was achieved at the femoral arteriotomy site.  IMPRESSION: Successful bilateral uterine artery embolization to treat symptomatic uterine fibroid disease.  Adequate occlusion of branch vessels supplying uterine fibroids was achieved with microsphere particle embolization.  The patient was admitted for overnight observation for treatment of post embolization symptoms.  Initial clinical follow-up will be performed in 2 weeks.   Original Report Authenticated By: Irish Lack, M.D.   Ir Angiogram Selective Each  Additional Vessel  11/02/2012   *RADIOLOGY REPORT*  Clinical Data: Symptomatic uterine fibroids with severe menorrhagia.  The patient presents for uterine fibroid embolization procedure.  1.  ULTRASOUND GUIDANCE FOR VASCULAR ACCESS OF THE RIGHT COMMON FEMORAL ARTERY 2.  SELECTIVE PELVIC ARTERIOGRAPHY OF THE LEFT INTERNAL ILIAC ARTERY 3.  ADDITIONAL SELECTIVE ARTERIOGRAPHY OF THE LEFT UTERINE ARTERY 4.  TRANSCATHETER EMBOLIZATION OF THE LEFT UTERINE ARTERY 5.  SELECTIVE PELVIC ARTERIOGRAPHY OF THE RIGHT INTERNAL ILIAC ARTERY 6.  SELECTIVE ARTERIOGRAPHY OF THE RIGHT UTERINE ARTERY 7.  TRANSCATHETER EMBOLIZATION OF THE RIGHT UTERINE ARTERY  Sedation: 4.5 mg IV Versed; 225 mcg IV Fentanyl.  Total Moderate Sedation Time: 90 minutes.  Contrast Volume: 80 ml Omnipaque-300  Additional Medications: 30 mg IV Toradol, 2 grams IV Ancef and 4 mg IV Decadron immediately prior to the procedure.  As antibiotic prophylaxis, Ancef was ordered pre-procedure and administered intravenously within one hour of incision.  Fluoroscropy Time: 21 minutes.  Procedure:  The procedure, risks, benefits, and alternatives were explained to the patient.  Questions regarding the procedure were encouraged and answered.  The patient understands and consents to the procedure.  The right groin was prepped with Betadine in a sterile fashion, and a sterile drape was applied covering the operative field.  A sterile gown and sterile gloves were used for the procedure.  Local anesthesia was provided with 1% Lidocaine.  After small skin incision, a 21 gauge needle was advanced into the right common femoral artery under direct ultrasound guidance. Ultrasound image documentation was performed.  After establishing guide wire access, a 5-French sheath was placed.  A 5 Fr diagnostic catheter was advanced over a guidewire into the distal abdominal aorta.  The catheter was then used to selectively catheterize the left common iliac  artery followed by the left  internal iliac artery.  Selective arteriography was performed.  A 2.8 Fr coaxial microcatheter was then introduced through the diagnostic catheter and advanced into the left uterine artery over a guide wire utilizing roadmapping technique.  Selective arteriography of the left uterine artery was performed through the microcatheter.  Left uterine artery embolization was then performed with installation of microsphere particles.  Follow-up arteriography was performed after embolization.  The microcatheter was removed.  The diagnostic catheter was then retracted and used to selectively catheterize the right internal iliac artery.  Selective arteriography was performed.  The microcatheter was then reintroduced and advanced into the right uterine artery over a guidewire utilizing roadmapping technique. Selective arteriography of the right uterine artery was then performed through the microcatheter.  Right uterine artery embolization was then performed with installation of microsphere particles.  Follow-up arteriography was performed after embolization.  Right femoral arteriotomy hemostasis: Cordis ExoSeal device.  Complications: None  Findings:  Bilateral uterine arteriography shows multiple enlarged hypervascular trunks supplying uterine fibroids.  Left uterine artery embolization was performed utilizing one vial of 500 - 700 micron sized Embosphere particles.  Completion arteriography demonstrates adequate occlusion of branches supplying uterine fibroids.  Right uterine artery embolization was performed utilizing one and a half vials of  500 - 700 micron sized Embosphere particles. Completion arteriography demonstrates adequate occlusion of branches supplying uterine fibroids.  Adequate hemostasis was achieved at the femoral arteriotomy site.  IMPRESSION: Successful bilateral uterine artery embolization to treat symptomatic uterine fibroid disease.  Adequate occlusion of branch vessels supplying uterine fibroids was  achieved with microsphere particle embolization.  The patient was admitted for overnight observation for treatment of post embolization symptoms.  Initial clinical follow-up will be performed in 2 weeks.   Original Report Authenticated By: Irish Lack, M.D.   Ir Angiogram Selective Each Additional Vessel  11/02/2012   *RADIOLOGY REPORT*  Clinical Data: Symptomatic uterine fibroids with severe menorrhagia.  The patient presents for uterine fibroid embolization procedure.  1.  ULTRASOUND GUIDANCE FOR VASCULAR ACCESS OF THE RIGHT COMMON FEMORAL ARTERY 2.  SELECTIVE PELVIC ARTERIOGRAPHY OF THE LEFT INTERNAL ILIAC ARTERY 3.  ADDITIONAL SELECTIVE ARTERIOGRAPHY OF THE LEFT UTERINE ARTERY 4.  TRANSCATHETER EMBOLIZATION OF THE LEFT UTERINE ARTERY 5.  SELECTIVE PELVIC ARTERIOGRAPHY OF THE RIGHT INTERNAL ILIAC ARTERY 6.  SELECTIVE ARTERIOGRAPHY OF THE RIGHT UTERINE ARTERY 7.  TRANSCATHETER EMBOLIZATION OF THE RIGHT UTERINE ARTERY  Sedation: 4.5 mg IV Versed; 225 mcg IV Fentanyl.  Total Moderate Sedation Time: 90 minutes.  Contrast Volume: 80 ml Omnipaque-300  Additional Medications: 30 mg IV Toradol, 2 grams IV Ancef and 4 mg IV Decadron immediately prior to the procedure.  As antibiotic prophylaxis, Ancef was ordered pre-procedure and administered intravenously within one hour of incision.  Fluoroscropy Time: 21 minutes.  Procedure:  The procedure, risks, benefits, and alternatives were explained to the patient.  Questions regarding the procedure were encouraged and answered.  The patient understands and consents to the procedure.  The right groin was prepped with Betadine in a sterile fashion, and a sterile drape was applied covering the operative field.  A sterile gown and sterile gloves were used for the procedure.  Local anesthesia was provided with 1% Lidocaine.  After small skin incision, a 21 gauge needle was advanced into the right common femoral artery under direct ultrasound guidance. Ultrasound image  documentation was performed.  After establishing guide wire access, a 5-French sheath was placed.  A 5 Fr diagnostic catheter was  advanced over a guidewire into the distal abdominal aorta.  The catheter was then used to selectively catheterize the left common iliac artery followed by the left internal iliac artery.  Selective arteriography was performed.  A 2.8 Fr coaxial microcatheter was then introduced through the diagnostic catheter and advanced into the left uterine artery over a guide wire utilizing roadmapping technique.  Selective arteriography of the left uterine artery was performed through the microcatheter.  Left uterine artery embolization was then performed with installation of microsphere particles.  Follow-up arteriography was performed after embolization.  The microcatheter was removed.  The diagnostic catheter was then retracted and used to selectively catheterize the right internal iliac artery.  Selective arteriography was performed.  The microcatheter was then reintroduced and advanced into the right uterine artery over a guidewire utilizing roadmapping technique. Selective arteriography of the right uterine artery was then performed through the microcatheter.  Right uterine artery embolization was then performed with installation of microsphere particles.  Follow-up arteriography was performed after embolization.  Right femoral arteriotomy hemostasis: Cordis ExoSeal device.  Complications: None  Findings:  Bilateral uterine arteriography shows multiple enlarged hypervascular trunks supplying uterine fibroids.  Left uterine artery embolization was performed utilizing one vial of 500 - 700 micron sized Embosphere particles.  Completion arteriography demonstrates adequate occlusion of branches supplying uterine fibroids.  Right uterine artery embolization was performed utilizing one and a half vials of  500 - 700 micron sized Embosphere particles. Completion arteriography demonstrates adequate  occlusion of branches supplying uterine fibroids.  Adequate hemostasis was achieved at the femoral arteriotomy site.  IMPRESSION: Successful bilateral uterine artery embolization to treat symptomatic uterine fibroid disease.  Adequate occlusion of branch vessels supplying uterine fibroids was achieved with microsphere particle embolization.  The patient was admitted for overnight observation for treatment of post embolization symptoms.  Initial clinical follow-up will be performed in 2 weeks.   Original Report Authenticated By: Irish Lack, M.D.   Ir US Guide Vasc Access Right  11/02/2012   *RADIOLOGY REPORT*  Clinical Data: Symptomatic uterine fibroids with severe menorrhagia.  The patient presents for uterine fibroid embolization procedure.  1.  ULTRASOUND GUIDANCE FOR VASCULAR ACCESS OF THE RIGHT COMMON FEMORAL ARTERY 2.  SELECTIVE PELVIC ARTERIOGRAPHY OF THE LEFT INTERNAL ILIAC ARTERY 3.  ADDITIONAL SELECTIVE ARTERIOGRAPHY OF THE LEFT UTERINE ARTERY 4.  TRANSCATHETER EMBOLIZATION OF THE LEFT UTERINE ARTERY 5.  SELECTIVE PELVIC ARTERIOGRAPHY OF THE RIGHT INTERNAL ILIAC ARTERY 6.  SELECTIVE ARTERIOGRAPHY OF THE RIGHT UTERINE ARTERY 7.  TRANSCATHETER EMBOLIZATION OF THE RIGHT UTERINE ARTERY  Sedation: 4.5 mg IV Versed; 225 mcg IV Fentanyl.  Total Moderate Sedation Time: 90 minutes.  Contrast Volume: 80 ml Omnipaque-300  Additional Medications: 30 mg IV Toradol, 2 grams IV Ancef and 4 mg IV Decadron immediately prior to the procedure.  As antibiotic prophylaxis, Ancef was ordered pre-procedure and administered intravenously within one hour of incision.  Fluoroscropy Time: 21 minutes.  Procedure:  The procedure, risks, benefits, and alternatives were explained to the patient.  Questions regarding the procedure were encouraged and answered.  The patient understands and consents to the procedure.  The right groin was prepped with Betadine in a sterile fashion, and a sterile drape was applied covering the  operative field.  A sterile gown and sterile gloves were used for the procedure.  Local anesthesia was provided with 1% Lidocaine.  After small skin incision, a 21 gauge needle was advanced into the right common femoral artery under direct ultrasound guidance. Ultrasound image  documentation was performed.  After establishing guide wire access, a 5-French sheath was placed.  A 5 Fr diagnostic catheter was advanced over a guidewire into the distal abdominal aorta.  The catheter was then used to selectively catheterize the left common iliac artery followed by the left internal iliac artery.  Selective arteriography was performed.  A 2.8 Fr coaxial microcatheter was then introduced through the diagnostic catheter and advanced into the left uterine artery over a guide wire utilizing roadmapping technique.  Selective arteriography of the left uterine artery was performed through the microcatheter.  Left uterine artery embolization was then performed with installation of microsphere particles.  Follow-up arteriography was performed after embolization.  The microcatheter was removed.  The diagnostic catheter was then retracted and used to selectively catheterize the right internal iliac artery.  Selective arteriography was performed.  The microcatheter was then reintroduced and advanced into the right uterine artery over a guidewire utilizing roadmapping technique. Selective arteriography of the right uterine artery was then performed through the microcatheter.  Right uterine artery embolization was then performed with installation of microsphere particles.  Follow-up arteriography was performed after embolization.  Right femoral arteriotomy hemostasis: Cordis ExoSeal device.  Complications: None  Findings:  Bilateral uterine arteriography shows multiple enlarged hypervascular trunks supplying uterine fibroids.  Left uterine artery embolization was performed utilizing one vial of 500 - 700 micron sized Embosphere particles.   Completion arteriography demonstrates adequate occlusion of branches supplying uterine fibroids.  Right uterine artery embolization was performed utilizing one and a half vials of  500 - 700 micron sized Embosphere particles. Completion arteriography demonstrates adequate occlusion of branches supplying uterine fibroids.  Adequate hemostasis was achieved at the femoral arteriotomy site.  IMPRESSION: Successful bilateral uterine artery embolization to treat symptomatic uterine fibroid disease.  Adequate occlusion of branch vessels supplying uterine fibroids was achieved with microsphere particle embolization.  The patient was admitted for overnight observation for treatment of post embolization symptoms.  Initial clinical follow-up will be performed in 2 weeks.   Original Report Authenticated By: Irish Lack, M.D.   Ir Embo Tumor Organ Ischemia Infarct Inc Guide Roadmapping  11/02/2012   *RADIOLOGY REPORT*  Clinical Data: Symptomatic uterine fibroids with severe menorrhagia.  The patient presents for uterine fibroid embolization procedure.  1.  ULTRASOUND GUIDANCE FOR VASCULAR ACCESS OF THE RIGHT COMMON FEMORAL ARTERY 2.  SELECTIVE PELVIC ARTERIOGRAPHY OF THE LEFT INTERNAL ILIAC ARTERY 3.  ADDITIONAL SELECTIVE ARTERIOGRAPHY OF THE LEFT UTERINE ARTERY 4.  TRANSCATHETER EMBOLIZATION OF THE LEFT UTERINE ARTERY 5.  SELECTIVE PELVIC ARTERIOGRAPHY OF THE RIGHT INTERNAL ILIAC ARTERY 6.  SELECTIVE ARTERIOGRAPHY OF THE RIGHT UTERINE ARTERY 7.  TRANSCATHETER EMBOLIZATION OF THE RIGHT UTERINE ARTERY  Sedation: 4.5 mg IV Versed; 225 mcg IV Fentanyl.  Total Moderate Sedation Time: 90 minutes.  Contrast Volume: 80 ml Omnipaque-300  Additional Medications: 30 mg IV Toradol, 2 grams IV Ancef and 4 mg IV Decadron immediately prior to the procedure.  As antibiotic prophylaxis, Ancef was ordered pre-procedure and administered intravenously within one hour of incision.  Fluoroscropy Time: 21 minutes.  Procedure:  The procedure,  risks, benefits, and alternatives were explained to the patient.  Questions regarding the procedure were encouraged and answered.  The patient understands and consents to the procedure.  The right groin was prepped with Betadine in a sterile fashion, and a sterile drape was applied covering the operative field.  A sterile gown and sterile gloves were used for the procedure.  Local anesthesia was provided with 1%  Lidocaine.  After small skin incision, a 21 gauge needle was advanced into the right common femoral artery under direct ultrasound guidance. Ultrasound image documentation was performed.  After establishing guide wire access, a 5-French sheath was placed.  A 5 Fr diagnostic catheter was advanced over a guidewire into the distal abdominal aorta.  The catheter was then used to selectively catheterize the left common iliac artery followed by the left internal iliac artery.  Selective arteriography was performed.  A 2.8 Fr coaxial microcatheter was then introduced through the diagnostic catheter and advanced into the left uterine artery over a guide wire utilizing roadmapping technique.  Selective arteriography of the left uterine artery was performed through the microcatheter.  Left uterine artery embolization was then performed with installation of microsphere particles.  Follow-up arteriography was performed after embolization.  The microcatheter was removed.  The diagnostic catheter was then retracted and used to selectively catheterize the right internal iliac artery.  Selective arteriography was performed.  The microcatheter was then reintroduced and advanced into the right uterine artery over a guidewire utilizing roadmapping technique. Selective arteriography of the right uterine artery was then performed through the microcatheter.  Right uterine artery embolization was then performed with installation of microsphere particles.  Follow-up arteriography was performed after embolization.  Right femoral  arteriotomy hemostasis: Cordis ExoSeal device.  Complications: None  Findings:  Bilateral uterine arteriography shows multiple enlarged hypervascular trunks supplying uterine fibroids.  Left uterine artery embolization was performed utilizing one vial of 500 - 700 micron sized Embosphere particles.  Completion arteriography demonstrates adequate occlusion of branches supplying uterine fibroids.  Right uterine artery embolization was performed utilizing one and a half vials of  500 - 700 micron sized Embosphere particles. Completion arteriography demonstrates adequate occlusion of branches supplying uterine fibroids.  Adequate hemostasis was achieved at the femoral arteriotomy site.  IMPRESSION: Successful bilateral uterine artery embolization to treat symptomatic uterine fibroid disease.  Adequate occlusion of branch vessels supplying uterine fibroids was achieved with microsphere particle embolization.  The patient was admitted for overnight observation for treatment of post embolization symptoms.  Initial clinical follow-up will be performed in 2 weeks.   Original Report Authenticated By: Irish Lack, M.D.    Anti-infectives: Anti-infectives   Start     Dose/Rate Route Frequency Ordered Stop   11/02/12 0730  ceFAZolin (ANCEF) IVPB 2 g/50 mL premix     2 g 100 mL/hr over 30 Minutes Intravenous On call 11/02/12 0722 11/02/12 1024      Assessment/Plan: s/p bilateral uterine artery embolization secondary to symptomatic uterine fibroids 7/24; d/c dilaudid PCA ; advance diet; hydrate; ambulate in hallway; if pt tolerates solid diet, able to void without difficulty will tent plan d/c home later today after pt seen by Dr. Fredia Sorrow; f/u in IR clinic in 2 weeks  LOS: 1 day    Tilton Marsalis,D [facility] 11/03/2012

## 2012-11-03 NOTE — Progress Notes (Signed)
Patient given all discharge instructions, and verbalized an understanding of all instructions.  Patient to travel home with friend.  Philomena Doheny RN

## 2012-11-03 NOTE — Discharge Summary (Signed)
Agree.  Clinic follow up in 2 weeks.

## 2012-11-03 NOTE — Discharge Summary (Signed)
Physician Discharge Summary  Patient ID: Erica [name] MRN: [id number] DOB/AGE: [birth year] 47 y.o.  Admit date: 11/02/2012 Discharge date: 11/03/2012  Admission Diagnoses: Uterine fibroids, menorrhagia  Discharge Diagnoses: Uterine fibroids s/p embolization, menorrhagia Active Problems:   * No active hospital problems. *   Discharged Condition: good  Hospital Course: Patient was admitted on 11/02/12 with c/o menorrhagia x 2 years and found to have uterine fibroids. She was scheduled for uterine fibroid embolization. She successfully had the procedure performed with no complications and was monitored overnight. Patient is doing well today walking the halls with no significant c/o pain or nausea. She denies any menstrual bleeding or hematuria.   Consults: none  Significant Diagnostic Studies:  Results for orders placed during the hospital encounter of 11/02/12  APTT      Result Value Range   aPTT 30  24 - 37 seconds  BASIC METABOLIC PANEL      Result Value Range   Sodium 138  135 - 145 mEq/L   Potassium 4.3  3.5 - 5.1 mEq/L   Chloride 101  96 - 112 mEq/L   CO2 27  19 - 32 mEq/L   Glucose, Bld 92  70 - 99 mg/dL   BUN 18  6 - 23 mg/dL   Creatinine, Ser 4.09  0.50 - 1.10 mg/dL   Calcium 9.5  8.4 - 81.1 mg/dL   GFR calc non Af Amer 81 (*) >90 mL/min   GFR calc Af Amer >90  >90 mL/min  CBC      Result Value Range   WBC 4.0  4.0 - 10.5 K/uL   RBC 3.97  3.87 - 5.11 MIL/uL   Hemoglobin 10.1 (*) 12.0 - 15.0 g/dL   HCT 91.4 (*) 78.2 - 95.6 %   MCV 79.3  78.0 - 100.0 fL   MCH 25.4 (*) 26.0 - 34.0 pg   MCHC 32.1  30.0 - 36.0 g/dL   RDW 21.3  08.6 - 57.8 %   Platelets 239  150 - 400 K/uL  PROTIME-INR      Result Value Range   Prothrombin Time 12.1  11.6 - 15.2 seconds   INR 0.91  0.00 - 1.49  HCG, SERUM, QUALITATIVE      Result Value Range   Preg, Serum NEGATIVE  NEGATIVE  CBC      Result Value Range   WBC 7.5  4.0 - 10.5 K/uL   RBC 4.16  3.87 - 5.11 MIL/uL   Hemoglobin 10.5  (*) 12.0 - 15.0 g/dL   HCT 46.9 (*) 62.9 - 52.8 %   MCV 79.3  78.0 - 100.0 fL   MCH 25.2 (*) 26.0 - 34.0 pg   MCHC 31.8  30.0 - 36.0 g/dL   RDW 41.3  24.4 - 01.0 %   Platelets 207  150 - 400 K/uL     Treatments: Mr Pelvis W Wo Contrast  10/09/2012   *RADIOLOGY REPORT*  Clinical Data: Fibroid uterus, pre uterine artery embolization exam  MRI PELVIS WITHOUT AND WITH CONTRAST  Technique:  Multiplanar multisequence MR imaging of the pelvis was performed both before and after administration of intravenous contrast.  Contrast: 15mL MULTIHANCE GADOBENATE DIMEGLUMINE 529 MG/ML IV SOLN  Comparison: Pelvic ultrasound, most recent exam 12/25/2007.  Findings: Anteverted, anteflexed uterus with innumerable predominately intramural fibroid, but some which demonstrate mass effect upon the endometrium.  The uterus measures 10.1 x 10.4 x 8.9 cm (491 ml).  There is a pedunculated subserosal left posterior lower uterine  segment fibroid producing mild mass effect upon the rectum, measuring 5.8 x 3.8 x 3.1 cm.  Dominant representative anterior uterine fundal intramural fibroid measures 5.1 x 3.6 x 4.0 cm.  Allowing for distortion of the endometrial canal by adjacent fibroids, no dominant pedunculated submucosal component is identified.  No evidence for adenomyosis. The ovaries are normal.  No pelvic lymphadenopathy.  Trace pelvic free fluid is identified.  After administration of contrast, there is inhomogeneous enhancement of the above described fibroids.  No dominant T2 hyperintense central necrosis is identified.  IMPRESSION: Fibroid uterus as above, with pedunculated left posterior lateral subserosal fibroid and other predominately intramural fibroids producing endometrial mass effect as above.   Original Report Authenticated By: Christiana Pellant, M.D.   Ir Angiogram Pelvis Selective Or Supraselective  11/02/2012   *RADIOLOGY REPORT*  Clinical Data: Symptomatic uterine fibroids with severe menorrhagia.  The patient  presents for uterine fibroid embolization procedure.  1.  ULTRASOUND GUIDANCE FOR VASCULAR ACCESS OF THE RIGHT COMMON FEMORAL ARTERY 2.  SELECTIVE PELVIC ARTERIOGRAPHY OF THE LEFT INTERNAL ILIAC ARTERY 3.  ADDITIONAL SELECTIVE ARTERIOGRAPHY OF THE LEFT UTERINE ARTERY 4.  TRANSCATHETER EMBOLIZATION OF THE LEFT UTERINE ARTERY 5.  SELECTIVE PELVIC ARTERIOGRAPHY OF THE RIGHT INTERNAL ILIAC ARTERY 6.  SELECTIVE ARTERIOGRAPHY OF THE RIGHT UTERINE ARTERY 7.  TRANSCATHETER EMBOLIZATION OF THE RIGHT UTERINE ARTERY  Sedation: 4.5 mg IV Versed; 225 mcg IV Fentanyl.  Total Moderate Sedation Time: 90 minutes.  Contrast Volume: 80 ml Omnipaque-300  Additional Medications: 30 mg IV Toradol, 2 grams IV Ancef and 4 mg IV Decadron immediately prior to the procedure.  As antibiotic prophylaxis, Ancef was ordered pre-procedure and administered intravenously within one hour of incision.  Fluoroscropy Time: 21 minutes.  Procedure:  The procedure, risks, benefits, and alternatives were explained to the patient.  Questions regarding the procedure were encouraged and answered.  The patient understands and consents to the procedure.  The right groin was prepped with Betadine in a sterile fashion, and a sterile drape was applied covering the operative field.  A sterile gown and sterile gloves were used for the procedure.  Local anesthesia was provided with 1% Lidocaine.  After small skin incision, a 21 gauge needle was advanced into the right common femoral artery under direct ultrasound guidance. Ultrasound image documentation was performed.  After establishing guide wire access, a 5-French sheath was placed.  A 5 Fr diagnostic catheter was advanced over a guidewire into the distal abdominal aorta.  The catheter was then used to selectively catheterize the left common iliac artery followed by the left internal iliac artery.  Selective arteriography was performed.  A 2.8 Fr coaxial microcatheter was then introduced through the diagnostic  catheter and advanced into the left uterine artery over a guide wire utilizing roadmapping technique.  Selective arteriography of the left uterine artery was performed through the microcatheter.  Left uterine artery embolization was then performed with installation of microsphere particles.  Follow-up arteriography was performed after embolization.  The microcatheter was removed.  The diagnostic catheter was then retracted and used to selectively catheterize the right internal iliac artery.  Selective arteriography was performed.  The microcatheter was then reintroduced and advanced into the right uterine artery over a guidewire utilizing roadmapping technique. Selective arteriography of the right uterine artery was then performed through the microcatheter.  Right uterine artery embolization was then performed with installation of microsphere particles.  Follow-up arteriography was performed after embolization.  Right femoral arteriotomy hemostasis: Cordis ExoSeal device.  Complications: None  Findings:  Bilateral uterine arteriography shows multiple enlarged hypervascular trunks supplying uterine fibroids.  Left uterine artery embolization was performed utilizing one vial of 500 - 700 micron sized Embosphere particles.  Completion arteriography demonstrates adequate occlusion of branches supplying uterine fibroids.  Right uterine artery embolization was performed utilizing one and a half vials of  500 - 700 micron sized Embosphere particles. Completion arteriography demonstrates adequate occlusion of branches supplying uterine fibroids.  Adequate hemostasis was achieved at the femoral arteriotomy site.  IMPRESSION: Successful bilateral uterine artery embolization to treat symptomatic uterine fibroid disease.  Adequate occlusion of branch vessels supplying uterine fibroids was achieved with microsphere particle embolization.  The patient was admitted for overnight observation for treatment of post embolization  symptoms.  Initial clinical follow-up will be performed in 2 weeks.   Original Report Authenticated By: Irish Lack, M.D.   Ir Angiogram Pelvis Selective Or Supraselective  11/02/2012   *RADIOLOGY REPORT*  Clinical Data: Symptomatic uterine fibroids with severe menorrhagia.  The patient presents for uterine fibroid embolization procedure.  1.  ULTRASOUND GUIDANCE FOR VASCULAR ACCESS OF THE RIGHT COMMON FEMORAL ARTERY 2.  SELECTIVE PELVIC ARTERIOGRAPHY OF THE LEFT INTERNAL ILIAC ARTERY 3.  ADDITIONAL SELECTIVE ARTERIOGRAPHY OF THE LEFT UTERINE ARTERY 4.  TRANSCATHETER EMBOLIZATION OF THE LEFT UTERINE ARTERY 5.  SELECTIVE PELVIC ARTERIOGRAPHY OF THE RIGHT INTERNAL ILIAC ARTERY 6.  SELECTIVE ARTERIOGRAPHY OF THE RIGHT UTERINE ARTERY 7.  TRANSCATHETER EMBOLIZATION OF THE RIGHT UTERINE ARTERY  Sedation: 4.5 mg IV Versed; 225 mcg IV Fentanyl.  Total Moderate Sedation Time: 90 minutes.  Contrast Volume: 80 ml Omnipaque-300  Additional Medications: 30 mg IV Toradol, 2 grams IV Ancef and 4 mg IV Decadron immediately prior to the procedure.  As antibiotic prophylaxis, Ancef was ordered pre-procedure and administered intravenously within one hour of incision.  Fluoroscropy Time: 21 minutes.  Procedure:  The procedure, risks, benefits, and alternatives were explained to the patient.  Questions regarding the procedure were encouraged and answered.  The patient understands and consents to the procedure.  The right groin was prepped with Betadine in a sterile fashion, and a sterile drape was applied covering the operative field.  A sterile gown and sterile gloves were used for the procedure.  Local anesthesia was provided with 1% Lidocaine.  After small skin incision, a 21 gauge needle was advanced into the right common femoral artery under direct ultrasound guidance. Ultrasound image documentation was performed.  After establishing guide wire access, a 5-French sheath was placed.  A 5 Fr diagnostic catheter was advanced  over a guidewire into the distal abdominal aorta.  The catheter was then used to selectively catheterize the left common iliac artery followed by the left internal iliac artery.  Selective arteriography was performed.  A 2.8 Fr coaxial microcatheter was then introduced through the diagnostic catheter and advanced into the left uterine artery over a guide wire utilizing roadmapping technique.  Selective arteriography of the left uterine artery was performed through the microcatheter.  Left uterine artery embolization was then performed with installation of microsphere particles.  Follow-up arteriography was performed after embolization.  The microcatheter was removed.  The diagnostic catheter was then retracted and used to selectively catheterize the right internal iliac artery.  Selective arteriography was performed.  The microcatheter was then reintroduced and advanced into the right uterine artery over a guidewire utilizing roadmapping technique. Selective arteriography of the right uterine artery was then performed through the microcatheter.  Right uterine artery embolization was then performed with installation of  microsphere particles.  Follow-up arteriography was performed after embolization.  Right femoral arteriotomy hemostasis: Cordis ExoSeal device.  Complications: None  Findings:  Bilateral uterine arteriography shows multiple enlarged hypervascular trunks supplying uterine fibroids.  Left uterine artery embolization was performed utilizing one vial of 500 - 700 micron sized Embosphere particles.  Completion arteriography demonstrates adequate occlusion of branches supplying uterine fibroids.  Right uterine artery embolization was performed utilizing one and a half vials of  500 - 700 micron sized Embosphere particles. Completion arteriography demonstrates adequate occlusion of branches supplying uterine fibroids.  Adequate hemostasis was achieved at the femoral arteriotomy site.  IMPRESSION: Successful  bilateral uterine artery embolization to treat symptomatic uterine fibroid disease.  Adequate occlusion of branch vessels supplying uterine fibroids was achieved with microsphere particle embolization.  The patient was admitted for overnight observation for treatment of post embolization symptoms.  Initial clinical follow-up will be performed in 2 weeks.   Original Report Authenticated By: Irish Lack, M.D.   Ir Angiogram Selective Each Additional Vessel  11/02/2012   *RADIOLOGY REPORT*  Clinical Data: Symptomatic uterine fibroids with severe menorrhagia.  The patient presents for uterine fibroid embolization procedure.  1.  ULTRASOUND GUIDANCE FOR VASCULAR ACCESS OF THE RIGHT COMMON FEMORAL ARTERY 2.  SELECTIVE PELVIC ARTERIOGRAPHY OF THE LEFT INTERNAL ILIAC ARTERY 3.  ADDITIONAL SELECTIVE ARTERIOGRAPHY OF THE LEFT UTERINE ARTERY 4.  TRANSCATHETER EMBOLIZATION OF THE LEFT UTERINE ARTERY 5.  SELECTIVE PELVIC ARTERIOGRAPHY OF THE RIGHT INTERNAL ILIAC ARTERY 6.  SELECTIVE ARTERIOGRAPHY OF THE RIGHT UTERINE ARTERY 7.  TRANSCATHETER EMBOLIZATION OF THE RIGHT UTERINE ARTERY  Sedation: 4.5 mg IV Versed; 225 mcg IV Fentanyl.  Total Moderate Sedation Time: 90 minutes.  Contrast Volume: 80 ml Omnipaque-300  Additional Medications: 30 mg IV Toradol, 2 grams IV Ancef and 4 mg IV Decadron immediately prior to the procedure.  As antibiotic prophylaxis, Ancef was ordered pre-procedure and administered intravenously within one hour of incision.  Fluoroscropy Time: 21 minutes.  Procedure:  The procedure, risks, benefits, and alternatives were explained to the patient.  Questions regarding the procedure were encouraged and answered.  The patient understands and consents to the procedure.  The right groin was prepped with Betadine in a sterile fashion, and a sterile drape was applied covering the operative field.  A sterile gown and sterile gloves were used for the procedure.  Local anesthesia was provided with 1% Lidocaine.   After small skin incision, a 21 gauge needle was advanced into the right common femoral artery under direct ultrasound guidance. Ultrasound image documentation was performed.  After establishing guide wire access, a 5-French sheath was placed.  A 5 Fr diagnostic catheter was advanced over a guidewire into the distal abdominal aorta.  The catheter was then used to selectively catheterize the left common iliac artery followed by the left internal iliac artery.  Selective arteriography was performed.  A 2.8 Fr coaxial microcatheter was then introduced through the diagnostic catheter and advanced into the left uterine artery over a guide wire utilizing roadmapping technique.  Selective arteriography of the left uterine artery was performed through the microcatheter.  Left uterine artery embolization was then performed with installation of microsphere particles.  Follow-up arteriography was performed after embolization.  The microcatheter was removed.  The diagnostic catheter was then retracted and used to selectively catheterize the right internal iliac artery.  Selective arteriography was performed.  The microcatheter was then reintroduced and advanced into the right uterine artery over a guidewire utilizing roadmapping technique. Selective arteriography of  the right uterine artery was then performed through the microcatheter.  Right uterine artery embolization was then performed with installation of microsphere particles.  Follow-up arteriography was performed after embolization.  Right femoral arteriotomy hemostasis: Cordis ExoSeal device.  Complications: None  Findings:  Bilateral uterine arteriography shows multiple enlarged hypervascular trunks supplying uterine fibroids.  Left uterine artery embolization was performed utilizing one vial of 500 - 700 micron sized Embosphere particles.  Completion arteriography demonstrates adequate occlusion of branches supplying uterine fibroids.  Right uterine artery  embolization was performed utilizing one and a half vials of  500 - 700 micron sized Embosphere particles. Completion arteriography demonstrates adequate occlusion of branches supplying uterine fibroids.  Adequate hemostasis was achieved at the femoral arteriotomy site.  IMPRESSION: Successful bilateral uterine artery embolization to treat symptomatic uterine fibroid disease.  Adequate occlusion of branch vessels supplying uterine fibroids was achieved with microsphere particle embolization.  The patient was admitted for overnight observation for treatment of post embolization symptoms.  Initial clinical follow-up will be performed in 2 weeks.   Original Report Authenticated By: Irish Lack, M.D.   Ir Angiogram Selective Each Additional Vessel  11/02/2012   *RADIOLOGY REPORT*  Clinical Data: Symptomatic uterine fibroids with severe menorrhagia.  The patient presents for uterine fibroid embolization procedure.  1.  ULTRASOUND GUIDANCE FOR VASCULAR ACCESS OF THE RIGHT COMMON FEMORAL ARTERY 2.  SELECTIVE PELVIC ARTERIOGRAPHY OF THE LEFT INTERNAL ILIAC ARTERY 3.  ADDITIONAL SELECTIVE ARTERIOGRAPHY OF THE LEFT UTERINE ARTERY 4.  TRANSCATHETER EMBOLIZATION OF THE LEFT UTERINE ARTERY 5.  SELECTIVE PELVIC ARTERIOGRAPHY OF THE RIGHT INTERNAL ILIAC ARTERY 6.  SELECTIVE ARTERIOGRAPHY OF THE RIGHT UTERINE ARTERY 7.  TRANSCATHETER EMBOLIZATION OF THE RIGHT UTERINE ARTERY  Sedation: 4.5 mg IV Versed; 225 mcg IV Fentanyl.  Total Moderate Sedation Time: 90 minutes.  Contrast Volume: 80 ml Omnipaque-300  Additional Medications: 30 mg IV Toradol, 2 grams IV Ancef and 4 mg IV Decadron immediately prior to the procedure.  As antibiotic prophylaxis, Ancef was ordered pre-procedure and administered intravenously within one hour of incision.  Fluoroscropy Time: 21 minutes.  Procedure:  The procedure, risks, benefits, and alternatives were explained to the patient.  Questions regarding the procedure were encouraged and answered.  The  patient understands and consents to the procedure.  The right groin was prepped with Betadine in a sterile fashion, and a sterile drape was applied covering the operative field.  A sterile gown and sterile gloves were used for the procedure.  Local anesthesia was provided with 1% Lidocaine.  After small skin incision, a 21 gauge needle was advanced into the right common femoral artery under direct ultrasound guidance. Ultrasound image documentation was performed.  After establishing guide wire access, a 5-French sheath was placed.  A 5 Fr diagnostic catheter was advanced over a guidewire into the distal abdominal aorta.  The catheter was then used to selectively catheterize the left common iliac artery followed by the left internal iliac artery.  Selective arteriography was performed.  A 2.8 Fr coaxial microcatheter was then introduced through the diagnostic catheter and advanced into the left uterine artery over a guide wire utilizing roadmapping technique.  Selective arteriography of the left uterine artery was performed through the microcatheter.  Left uterine artery embolization was then performed with installation of microsphere particles.  Follow-up arteriography was performed after embolization.  The microcatheter was removed.  The diagnostic catheter was then retracted and used to selectively catheterize the right internal iliac artery.  Selective arteriography was performed.  The microcatheter was then reintroduced and advanced into the right uterine artery over a guidewire utilizing roadmapping technique. Selective arteriography of the right uterine artery was then performed through the microcatheter.  Right uterine artery embolization was then performed with installation of microsphere particles.  Follow-up arteriography was performed after embolization.  Right femoral arteriotomy hemostasis: Cordis ExoSeal device.  Complications: None  Findings:  Bilateral uterine arteriography shows multiple enlarged  hypervascular trunks supplying uterine fibroids.  Left uterine artery embolization was performed utilizing one vial of 500 - 700 micron sized Embosphere particles.  Completion arteriography demonstrates adequate occlusion of branches supplying uterine fibroids.  Right uterine artery embolization was performed utilizing one and a half vials of  500 - 700 micron sized Embosphere particles. Completion arteriography demonstrates adequate occlusion of branches supplying uterine fibroids.  Adequate hemostasis was achieved at the femoral arteriotomy site.  IMPRESSION: Successful bilateral uterine artery embolization to treat symptomatic uterine fibroid disease.  Adequate occlusion of branch vessels supplying uterine fibroids was achieved with microsphere particle embolization.  The patient was admitted for overnight observation for treatment of post embolization symptoms.  Initial clinical follow-up will be performed in 2 weeks.   Original Report Authenticated By: Irish Lack, M.D.   Ir US Guide Vasc Access Right  11/02/2012   *RADIOLOGY REPORT*  Clinical Data: Symptomatic uterine fibroids with severe menorrhagia.  The patient presents for uterine fibroid embolization procedure.  1.  ULTRASOUND GUIDANCE FOR VASCULAR ACCESS OF THE RIGHT COMMON FEMORAL ARTERY 2.  SELECTIVE PELVIC ARTERIOGRAPHY OF THE LEFT INTERNAL ILIAC ARTERY 3.  ADDITIONAL SELECTIVE ARTERIOGRAPHY OF THE LEFT UTERINE ARTERY 4.  TRANSCATHETER EMBOLIZATION OF THE LEFT UTERINE ARTERY 5.  SELECTIVE PELVIC ARTERIOGRAPHY OF THE RIGHT INTERNAL ILIAC ARTERY 6.  SELECTIVE ARTERIOGRAPHY OF THE RIGHT UTERINE ARTERY 7.  TRANSCATHETER EMBOLIZATION OF THE RIGHT UTERINE ARTERY  Sedation: 4.5 mg IV Versed; 225 mcg IV Fentanyl.  Total Moderate Sedation Time: 90 minutes.  Contrast Volume: 80 ml Omnipaque-300  Additional Medications: 30 mg IV Toradol, 2 grams IV Ancef and 4 mg IV Decadron immediately prior to the procedure.  As antibiotic prophylaxis, Ancef was ordered  pre-procedure and administered intravenously within one hour of incision.  Fluoroscropy Time: 21 minutes.  Procedure:  The procedure, risks, benefits, and alternatives were explained to the patient.  Questions regarding the procedure were encouraged and answered.  The patient understands and consents to the procedure.  The right groin was prepped with Betadine in a sterile fashion, and a sterile drape was applied covering the operative field.  A sterile gown and sterile gloves were used for the procedure.  Local anesthesia was provided with 1% Lidocaine.  After small skin incision, a 21 gauge needle was advanced into the right common femoral artery under direct ultrasound guidance. Ultrasound image documentation was performed.  After establishing guide wire access, a 5-French sheath was placed.  A 5 Fr diagnostic catheter was advanced over a guidewire into the distal abdominal aorta.  The catheter was then used to selectively catheterize the left common iliac artery followed by the left internal iliac artery.  Selective arteriography was performed.  A 2.8 Fr coaxial microcatheter was then introduced through the diagnostic catheter and advanced into the left uterine artery over a guide wire utilizing roadmapping technique.  Selective arteriography of the left uterine artery was performed through the microcatheter.  Left uterine artery embolization was then performed with installation of microsphere particles.  Follow-up arteriography was performed after embolization.  The microcatheter was removed.  The  diagnostic catheter was then retracted and used to selectively catheterize the right internal iliac artery.  Selective arteriography was performed.  The microcatheter was then reintroduced and advanced into the right uterine artery over a guidewire utilizing roadmapping technique. Selective arteriography of the right uterine artery was then performed through the microcatheter.  Right uterine artery embolization was  then performed with installation of microsphere particles.  Follow-up arteriography was performed after embolization.  Right femoral arteriotomy hemostasis: Cordis ExoSeal device.  Complications: None  Findings:  Bilateral uterine arteriography shows multiple enlarged hypervascular trunks supplying uterine fibroids.  Left uterine artery embolization was performed utilizing one vial of 500 - 700 micron sized Embosphere particles.  Completion arteriography demonstrates adequate occlusion of branches supplying uterine fibroids.  Right uterine artery embolization was performed utilizing one and a half vials of  500 - 700 micron sized Embosphere particles. Completion arteriography demonstrates adequate occlusion of branches supplying uterine fibroids.  Adequate hemostasis was achieved at the femoral arteriotomy site.  IMPRESSION: Successful bilateral uterine artery embolization to treat symptomatic uterine fibroid disease.  Adequate occlusion of branch vessels supplying uterine fibroids was achieved with microsphere particle embolization.  The patient was admitted for overnight observation for treatment of post embolization symptoms.  Initial clinical follow-up will be performed in 2 weeks.   Original Report Authenticated By: Irish Lack, M.D.   Ir Embo Tumor Organ Ischemia Infarct Inc Guide Roadmapping  11/02/2012   *RADIOLOGY REPORT*  Clinical Data: Symptomatic uterine fibroids with severe menorrhagia.  The patient presents for uterine fibroid embolization procedure.  1.  ULTRASOUND GUIDANCE FOR VASCULAR ACCESS OF THE RIGHT COMMON FEMORAL ARTERY 2.  SELECTIVE PELVIC ARTERIOGRAPHY OF THE LEFT INTERNAL ILIAC ARTERY 3.  ADDITIONAL SELECTIVE ARTERIOGRAPHY OF THE LEFT UTERINE ARTERY 4.  TRANSCATHETER EMBOLIZATION OF THE LEFT UTERINE ARTERY 5.  SELECTIVE PELVIC ARTERIOGRAPHY OF THE RIGHT INTERNAL ILIAC ARTERY 6.  SELECTIVE ARTERIOGRAPHY OF THE RIGHT UTERINE ARTERY 7.  TRANSCATHETER EMBOLIZATION OF THE RIGHT UTERINE  ARTERY  Sedation: 4.5 mg IV Versed; 225 mcg IV Fentanyl.  Total Moderate Sedation Time: 90 minutes.  Contrast Volume: 80 ml Omnipaque-300  Additional Medications: 30 mg IV Toradol, 2 grams IV Ancef and 4 mg IV Decadron immediately prior to the procedure.  As antibiotic prophylaxis, Ancef was ordered pre-procedure and administered intravenously within one hour of incision.  Fluoroscropy Time: 21 minutes.  Procedure:  The procedure, risks, benefits, and alternatives were explained to the patient.  Questions regarding the procedure were encouraged and answered.  The patient understands and consents to the procedure.  The right groin was prepped with Betadine in a sterile fashion, and a sterile drape was applied covering the operative field.  A sterile gown and sterile gloves were used for the procedure.  Local anesthesia was provided with 1% Lidocaine.  After small skin incision, a 21 gauge needle was advanced into the right common femoral artery under direct ultrasound guidance. Ultrasound image documentation was performed.  After establishing guide wire access, a 5-French sheath was placed.  A 5 Fr diagnostic catheter was advanced over a guidewire into the distal abdominal aorta.  The catheter was then used to selectively catheterize the left common iliac artery followed by the left internal iliac artery.  Selective arteriography was performed.  A 2.8 Fr coaxial microcatheter was then introduced through the diagnostic catheter and advanced into the left uterine artery over a guide wire utilizing roadmapping technique.  Selective arteriography of the left uterine artery was performed through the microcatheter.  Left uterine  artery embolization was then performed with installation of microsphere particles.  Follow-up arteriography was performed after embolization.  The microcatheter was removed.  The diagnostic catheter was then retracted and used to selectively catheterize the right internal iliac artery.  Selective  arteriography was performed.  The microcatheter was then reintroduced and advanced into the right uterine artery over a guidewire utilizing roadmapping technique. Selective arteriography of the right uterine artery was then performed through the microcatheter.  Right uterine artery embolization was then performed with installation of microsphere particles.  Follow-up arteriography was performed after embolization.  Right femoral arteriotomy hemostasis: Cordis ExoSeal device.  Complications: None  Findings:  Bilateral uterine arteriography shows multiple enlarged hypervascular trunks supplying uterine fibroids.  Left uterine artery embolization was performed utilizing one vial of 500 - 700 micron sized Embosphere particles.  Completion arteriography demonstrates adequate occlusion of branches supplying uterine fibroids.  Right uterine artery embolization was performed utilizing one and a half vials of  500 - 700 micron sized Embosphere particles. Completion arteriography demonstrates adequate occlusion of branches supplying uterine fibroids.  Adequate hemostasis was achieved at the femoral arteriotomy site.  IMPRESSION: Successful bilateral uterine artery embolization to treat symptomatic uterine fibroid disease.  Adequate occlusion of branch vessels supplying uterine fibroids was achieved with microsphere particle embolization.  The patient was admitted for overnight observation for treatment of post embolization symptoms.  Initial clinical follow-up will be performed in 2 weeks.   Original Report Authenticated By: Irish Lack, M.D.    Discharge Exam: Blood pressure 117/52, pulse 97, temperature 98.9 F (37.2 C), temperature source Oral, resp. rate 16, height 5\' 7"  (1.702 m), weight 159 lb (72.122 kg), last menstrual period 09/27/2012, SpO2 99.00%.   PE:  General: Patient A&OX3.  Lungs: CTA b/l Heart: RRR without M/G/R Abdomen: Soft (+) BS, NT, fibroid uterus.  Extremities: FROM with no edema, DP  intact B/L Rt. Groin puncture site C/D, no signs of bleeding, hematoma or infection.   Disposition: Home    Medication List         DSS 100 MG Caps  Take 100 mg by mouth 2 (two) times daily.     hydrochlorothiazide 25 MG tablet  Commonly known as:  HYDRODIURIL  Take 25 mg by mouth every evening.     HYDROcodone-acetaminophen 10-325 MG per tablet  Commonly known as:  NORCO  Take 1-2 tablets by mouth every 4 (four) hours as needed.     multivitamin with minerals Tabs  Take 1 tablet by mouth daily.     NON FORMULARY  Take 2 capsules by mouth daily. Ultra Hair     polyvinyl alcohol 1.4 % ophthalmic solution  Commonly known as:  LIQUIFILM TEARS  Place 1 drop into both eyes daily as needed (dry eyes).     promethazine 25 MG tablet  Commonly known as:  PHENERGAN  Take 1 tablet (25 mg total) by mouth every 8 (eight) hours as needed for nausea.           Follow-up Information   Follow up with [facility] T, MD. (office will call with f/u appointment in 2 weeks; Call [phone] or [phone] with any questions)    Contact information:   [address] [place] [address] [phone]       Signed: Berneta Levins 11/03/2012, 10:56 AM

## 2012-11-15 ENCOUNTER — Ambulatory Visit
Admission: RE | Admit: 2012-11-15 | Discharge: 2012-11-15 | Disposition: A | Discharge status: Home / Self Care | Payer: BC Managed Care – PPO | Source: Ambulatory Visit | Attending: Radiology | Admitting: Radiology

## 2012-11-15 VITALS — BP 113/67 | HR 82 | Temp 98.3°F | Resp 12

## 2012-11-15 DIAGNOSIS — D259 Leiomyoma of uterus, unspecified: Secondary | ICD-10-CM

## 2012-11-15 NOTE — Progress Notes (Signed)
Pt 2wk post [place] procedure.  C/c of pelvic pain, bloating,constipation.   Colace makes her "gassy".  No other symptoms  And she has not started her cycle yet.    Assisted Dr Fredia Sorrow in room with pt. For groin ck./incision site.  Jari Sportsman, EMT 11/15/2012 4:41 PM

## 2012-11-16 NOTE — Addendum Note (Signed)
Encounter addended by: Reola Calkins, MD on: 11/16/2012  8:40 AM<BR>     Documentation filed: Clinical Notes

## 2012-11-16 NOTE — Progress Notes (Signed)
Patient ID: Erica [name], female   DOB: 07-May-1965, 47 y.o.   MRN: [id number]  ESTABLISHED PATIENT OFFICE VISIT  Chief Complaint: Status post uterine fibroid embolization on 11/02/2012.  History: Erica [name] returns for follow-up. Postprocedural pelvic pain has improved with some mild residual periodic pain present. Her primary complaint is abdominal bloating and constipation. She has not had any vaginal bleeding or discharge.  Review of Systems: No fever or chills. No urinary symptoms.  Exam: Vital signs: Blood pressure 113/67, pulse 82, respirations 12, temperature 98.3 and oxygen saturation 100% on room air. General: No acute distress. Abdomen: Soft and nontender. Extremities: The right groin arteriotomy site is healed and shows no tenderness, palpable hematoma or ecchymosis. The right femoral arterial pulse is normally palpable.  Assessment and Plan: The patient is recovering normally after fibroid embolization. I did recommend trying some other over-the- counter medicines should she have continued constipation. The patient has not yet had a menstrual cycle since the procedure. Follow-up MRI of the pelvis will be performed in 6 to 9 months and I will meet with the patient to review imaging findings at that time.

## 2013-01-23 ENCOUNTER — Telehealth: Payer: Self-pay | Admitting: Radiology

## 2013-01-23 NOTE — Telephone Encounter (Signed)
3 mo f/u [place] phone call.    Patient states that she is having monthly menstrual cycles.  Symptoms have improved with scant-minimal flow.  Occasional minimal cramping.  Overall doing well.  States that she prefers to have follow up at 6 months post [place].  Adna Nofziger Carmell Austria, RN 01/23/2013 11:59 AM

## 2013-04-03 ENCOUNTER — Other Ambulatory Visit (HOSPITAL_COMMUNITY): Payer: Self-pay | Admitting: Interventional Radiology

## 2013-04-03 DIAGNOSIS — D259 Leiomyoma of uterus, unspecified: Secondary | ICD-10-CM

## 2013-04-24 ENCOUNTER — Telehealth: Payer: Self-pay | Admitting: Internal Medicine

## 2013-04-24 NOTE — Telephone Encounter (Signed)
S/w pt and gve np appt 01/19 @ 11 w/Dr. Julien Nordmann Referring Dr. Antony Blackbird Dx- Dec'd WBC Welcome packet mailed.

## 2013-04-24 NOTE — Telephone Encounter (Signed)
LVOM FOR PT TO RETURN CALL IN RE TO REFERRAL.  °

## 2013-04-24 NOTE — Telephone Encounter (Signed)
C/D 04/24/13 for appt. 04/30/13 °

## 2013-04-25 ENCOUNTER — Other Ambulatory Visit (HOSPITAL_COMMUNITY): Payer: Self-pay | Admitting: Interventional Radiology

## 2013-04-25 ENCOUNTER — Other Ambulatory Visit: Payer: Self-pay | Admitting: Gynecology

## 2013-04-25 DIAGNOSIS — D259 Leiomyoma of uterus, unspecified: Secondary | ICD-10-CM

## 2013-04-29 ENCOUNTER — Other Ambulatory Visit: Payer: Self-pay | Admitting: Internal Medicine

## 2013-04-29 DIAGNOSIS — D72819 Decreased white blood cell count, unspecified: Secondary | ICD-10-CM

## 2013-04-30 ENCOUNTER — Ambulatory Visit (HOSPITAL_BASED_OUTPATIENT_CLINIC_OR_DEPARTMENT_OTHER): Payer: BC Managed Care – PPO | Admitting: Internal Medicine

## 2013-04-30 ENCOUNTER — Encounter: Payer: Self-pay | Admitting: Internal Medicine

## 2013-04-30 ENCOUNTER — Ambulatory Visit: Payer: BC Managed Care – PPO

## 2013-04-30 ENCOUNTER — Other Ambulatory Visit (HOSPITAL_BASED_OUTPATIENT_CLINIC_OR_DEPARTMENT_OTHER): Payer: BC Managed Care – PPO

## 2013-04-30 VITALS — BP 140/93 | HR 73 | Temp 98.2°F | Resp 20 | Ht 67.0 in | Wt 161.6 lb

## 2013-04-30 DIAGNOSIS — D72819 Decreased white blood cell count, unspecified: Secondary | ICD-10-CM

## 2013-04-30 LAB — CBC WITH DIFFERENTIAL/PLATELET
BASO%: 0.5 % (ref 0.0–2.0)
Basophils Absolute: 0 10*3/uL (ref 0.0–0.1)
EOS%: 1.4 % (ref 0.0–7.0)
Eosinophils Absolute: 0.1 10*3/uL (ref 0.0–0.5)
HCT: 40.2 % (ref 34.8–46.6)
HGB: 13 g/dL (ref 11.6–15.9)
LYMPH%: 44.4 % (ref 14.0–49.7)
MCH: 28.6 pg (ref 25.1–34.0)
MCHC: 32.4 g/dL (ref 31.5–36.0)
MCV: 88.3 fL (ref 79.5–101.0)
MONO#: 0.5 10*3/uL (ref 0.1–0.9)
MONO%: 12.1 % (ref 0.0–14.0)
NEUT#: 1.8 10*3/uL (ref 1.5–6.5)
NEUT%: 41.6 % (ref 38.4–76.8)
Platelets: 175 10*3/uL (ref 145–400)
RBC: 4.55 10*6/uL (ref 3.70–5.45)
RDW: 15.3 % — ABNORMAL HIGH (ref 11.2–14.5)
WBC: 4.3 10*3/uL (ref 3.9–10.3)
lymph#: 1.9 10*3/uL (ref 0.9–3.3)

## 2013-04-30 LAB — COMPREHENSIVE METABOLIC PANEL (CC13)
ALT: 20 U/L (ref 0–55)
AST: 25 U/L (ref 5–34)
Albumin: 3.9 g/dL (ref 3.5–5.0)
Alkaline Phosphatase: 69 U/L (ref 40–150)
Anion Gap: 10 mEq/L (ref 3–11)
BUN: 16.9 mg/dL (ref 7.0–26.0)
CO2: 26 mEq/L (ref 22–29)
Calcium: 9.8 mg/dL (ref 8.4–10.4)
Chloride: 106 mEq/L (ref 98–109)
Creatinine: 0.9 mg/dL (ref 0.6–1.1)
Glucose: 101 mg/dl (ref 70–140)
Potassium: 3.8 mEq/L (ref 3.5–5.1)
Sodium: 141 mEq/L (ref 136–145)
Total Bilirubin: 0.88 mg/dL (ref 0.20–1.20)
Total Protein: 7.9 g/dL (ref 6.4–8.3)

## 2013-04-30 LAB — LACTATE DEHYDROGENASE (CC13): LDH: 162 U/L (ref 125–245)

## 2013-04-30 NOTE — Progress Notes (Signed)
Checked in new patient with no financial issues ., she has appt card and has not been to [place]. She has new insurance and had not recd new card. She is aware of possible self pay until we get card in system. I can call and see if can get verbal verf of insurance. She knows to make sure to get card to [place] when she gets.

## 2013-04-30 NOTE — Progress Notes (Signed)
[facility] [phone]   [phone]  CONSULT NOTE  REFERRING PHYSICIAN: Dr. Antony Blackbird.  REASON FOR CONSULTATION:  48 years old [place] American female with leukocytopenia  HPI Erica [name] is a 48 y.o. female was past medical history significant for hypertension, depression and fibroid tumor status post embolization. The patient was seen recently by her primary care physician Dr. Chapman Fitch and CBC during the visit showed low white blood count of 3.8. The patient was concerned about this abnormality and requested a referral to hematology for further evaluation of her condition because of her previous family history of breast cancer and Hodgkin's lymphoma. Her previous CBC available in EPIC showed normal white blood count over the last few years. The patient is feeling fine with no specific complaint except for fatigue which significantly improved after she underwent the fibroid embolization. She was treated with over-the-counter iron tablet for history of iron deficiency anemia. She does not take any over-the-counter medications except for multivitamins. She denied having any recent or recurrent infection. She has no significant weight loss but has night sweats secondary to perimenopausal syndrome. She denied having any other significant complaints today. HPI  Past Medical History  Diagnosis Date  . Fibroids   . Essential hypertension, benign   . Hx of adenomatous colonic polyps   . GERD (gastroesophageal reflux disease)   . HH (hiatus hernia)   . IBS (irritable bowel syndrome)     Past Surgical History  Procedure Laterality Date  . Uterine polypectomy      Family History  Problem Relation Age of Onset  . Hodgkin's lymphoma Mother   . Alzheimer's disease Father   . Colon cancer Maternal Grandfather   . Stroke Paternal Grandfather     Social History History  Substance Use Topics  . Smoking status: Never Smoker   . Smokeless tobacco:  Never Used  . Alcohol Use: No    No Known Allergies  Current Outpatient Prescriptions  Medication Sig Dispense Refill  . cholecalciferol (VITAMIN D) 1000 UNITS tablet Take 1,000 Units by mouth daily.      . hydrochlorothiazide (HYDRODIURIL) 25 MG tablet Take 25 mg by mouth every evening.       . Multiple Vitamin (MULTIVITAMIN WITH MINERALS) TABS Take 1 tablet by mouth daily.      . polyvinyl alcohol (LIQUIFILM TEARS) 1.4 % ophthalmic solution Place 1 drop into both eyes daily as needed (dry eyes).       No current facility-administered medications for this visit.    Review of Systems  Constitutional: negative Eyes: negative Ears, nose, mouth, throat, and face: negative Respiratory: negative Cardiovascular: negative Gastrointestinal: negative Genitourinary:negative Integument/breast: negative Hematologic/lymphatic: negative Musculoskeletal:negative Neurological: negative Behavioral/Psych: negative Endocrine: negative Allergic/Immunologic: negative  Physical Exam  VEL:FYBOF, healthy, no distress, well nourished and well developed SKIN: skin color, texture, turgor are normal HEAD: Normocephalic, No masses, lesions, tenderness or abnormalities EYES: normal, PERRLA EARS: External ears normal, Canals clear OROPHARYNX:no exudate and no erythema  NECK: supple, no adenopathy, no JVD LYMPH:  no palpable lymphadenopathy, no hepatosplenomegaly BREAST:not examined LUNGS: clear to auscultation , and palpation HEART: regular rate & rhythm, no murmurs and no gallops ABDOMEN:abdomen soft, non-tender, normal bowel sounds and no masses or organomegaly BACK: Back symmetric, no curvature., No CVA tenderness EXTREMITIES:no joint deformities, effusion, or inflammation, no edema, no skin discoloration  NEURO: alert & oriented x 3 with fluent speech, no focal motor/sensory deficits  PERFORMANCE STATUS: ECOG 0  LABORATORY DATA: Lab  Results  Component Value Date   WBC 4.3 04/30/2013    HGB 13.0 04/30/2013   HCT 40.2 04/30/2013   MCV 88.3 04/30/2013   PLT 175 04/30/2013      Chemistry      Component Value Date/Time   NA 141 04/30/2013 1105   NA 138 11/02/2012 0800   K 3.8 04/30/2013 1105   K 4.3 11/02/2012 0800   CL 101 11/02/2012 0800   CO2 26 04/30/2013 1105   CO2 27 11/02/2012 0800   BUN 16.9 04/30/2013 1105   BUN 18 11/02/2012 0800   CREATININE 0.9 04/30/2013 1105   CREATININE 0.84 11/02/2012 0800   CREATININE 0.83 09/27/2012 1456      Component Value Date/Time   CALCIUM 9.8 04/30/2013 1105   CALCIUM 9.5 11/02/2012 0800   ALKPHOS 69 04/30/2013 1105   AST 25 04/30/2013 1105   ALT 20 04/30/2013 1105   BILITOT 0.88 04/30/2013 1105       RADIOGRAPHIC STUDIES: No results found.  ASSESSMENT: This is a very pleasant 49 years old [place] American female who presented for evaluation of mild leukocytopenia. This is completely resolved at this point and the patient has normal white blood count on today's CBC.   PLAN: I discussed the lab result with the patient today. I explained to the patient that this could be lab variation. Her total white blood count and CBC is normal today. I recommended for the patient to continue on observation with her primary care physician. I don't see a need for any further investigation of this condition at this point. I would be happy to see the patient in the future if needed. She was advised to call if she has any concerning symptoms. The patient voices understanding of current disease status and treatment options and is in agreement with the current care plan.  All questions were answered. The patient knows to call the clinic with any problems, questions or concerns. We can certainly see the patient much sooner if necessary.  Thank you so much for allowing me to participate in the care of Erica [name]. I will continue to follow up the patient with you and assist in her care.  I spent 30 minutes counseling the patient face to face. The total  time spent in the appointment was 50 minutes.  Disclaimer: This note was dictated with voice recognition software. Similar sounding words can inadvertently be transcribed and may not be corrected upon review.   Lachrista Heslin K. 04/30/2013, 12:14 PM

## 2013-05-01 ENCOUNTER — Encounter: Payer: Self-pay | Admitting: Internal Medicine

## 2013-05-01 NOTE — Progress Notes (Signed)
Called and spoke with Butch Penny at [place] and she said current policy for patient is still active. She should be getting new card soon, but make sure she calls and coordinate benefits so she won't have problems later with 2 policies being active the same time. The new one became active 04/12/13. I will let her know.

## 2013-05-01 NOTE — Progress Notes (Signed)
Called and left message for patient to call me.

## 2013-05-23 ENCOUNTER — Encounter: Payer: Self-pay | Admitting: Radiology

## 2013-07-13 ENCOUNTER — Ambulatory Visit
Admission: RE | Admit: 2013-07-13 | Discharge: 2013-07-13 | Disposition: A | Discharge status: Home / Self Care | Payer: BC Managed Care – PPO | Source: Ambulatory Visit | Attending: Gynecology | Admitting: Gynecology

## 2013-07-13 DIAGNOSIS — D259 Leiomyoma of uterus, unspecified: Secondary | ICD-10-CM

## 2013-07-13 MED ORDER — GADOBENATE DIMEGLUMINE 529 MG/ML IV SOLN
15.0000 mL | Freq: Once | INTRAVENOUS | Status: AC | PRN
  Administered 2013-07-13: 15 mL via INTRAVENOUS

## 2013-07-17 ENCOUNTER — Ambulatory Visit
Admission: RE | Admit: 2013-07-17 | Discharge: 2013-07-17 | Disposition: A | Discharge status: Home / Self Care | Payer: BC Managed Care – PPO | Source: Ambulatory Visit | Attending: Interventional Radiology | Admitting: Interventional Radiology

## 2013-07-17 DIAGNOSIS — D259 Leiomyoma of uterus, unspecified: Secondary | ICD-10-CM

## 2013-07-17 NOTE — Progress Notes (Signed)
8.5 MOS. POST [place] BY DR Kathlene Cote  NO COMPLAINTS OF PELVIC PAIN OR PRESSURE. DOES EXPERIENCE SOME FATIGUE - ON FE SUPP. AND HAVING HOT FLASHES.  PT HAS NOT REALLY EXPERIENCED A "TRUE CYCLE" SINCE THE [place].  JUST STARTED SOME SPOTTING ABOUT 3WKS AGO OFF AND ON.

## 2013-08-03 ENCOUNTER — Encounter: Payer: Self-pay | Admitting: Internal Medicine

## 2013-08-03 NOTE — Progress Notes (Signed)
Patient called and left mess saying she forgot to send copy of new ins card and for me to call and leave fax# to send.

## 2013-10-03 ENCOUNTER — Other Ambulatory Visit: Payer: Self-pay | Admitting: Gynecology

## 2013-10-11 ENCOUNTER — Encounter: Payer: Self-pay | Admitting: *Deleted

## 2013-10-11 ENCOUNTER — Telehealth: Payer: Self-pay | Admitting: Internal Medicine

## 2013-10-11 NOTE — Telephone Encounter (Signed)
Spoke with patient and gave her Dr. Brodie's recommendations. 

## 2013-10-11 NOTE — Telephone Encounter (Signed)
Mad citrate 1 bottle today, may repeat tomorrow, then start Miralax 17 gm daily, titrate the dose,

## 2013-10-11 NOTE — Telephone Encounter (Signed)
Left a message for patient to call me. 

## 2013-10-11 NOTE — Telephone Encounter (Signed)
Spoke with patient and she states she has had trouble with constipation x 2 months. She is bloated and feels awful. She tried Dulcolax without relief. Denies vomiting or fever. She will try Miralax or Magnesium citrate. Scheduled with Alonza Bogus, PA on 10/16/13 at 3:00 PM.

## 2013-10-11 NOTE — Telephone Encounter (Signed)
Left a message for patient to call back. 

## 2013-10-16 ENCOUNTER — Encounter: Payer: Self-pay | Admitting: Gastroenterology

## 2013-10-16 ENCOUNTER — Other Ambulatory Visit (INDEPENDENT_AMBULATORY_CARE_PROVIDER_SITE_OTHER): Payer: BC Managed Care – PPO

## 2013-10-16 ENCOUNTER — Ambulatory Visit (INDEPENDENT_AMBULATORY_CARE_PROVIDER_SITE_OTHER): Payer: BC Managed Care – PPO | Admitting: Gastroenterology

## 2013-10-16 VITALS — BP 98/56 | HR 68 | Ht 67.5 in | Wt 160.8 lb

## 2013-10-16 DIAGNOSIS — R140 Abdominal distension (gaseous): Secondary | ICD-10-CM

## 2013-10-16 DIAGNOSIS — R143 Flatulence: Secondary | ICD-10-CM

## 2013-10-16 DIAGNOSIS — R141 Gas pain: Secondary | ICD-10-CM

## 2013-10-16 DIAGNOSIS — K5900 Constipation, unspecified: Secondary | ICD-10-CM

## 2013-10-16 DIAGNOSIS — R142 Eructation: Secondary | ICD-10-CM

## 2013-10-16 LAB — IGA: IgA: 385 mg/dL — ABNORMAL HIGH (ref 68–378)

## 2013-10-16 NOTE — Patient Instructions (Signed)
Please continue your Miralax  Lactose Diet given for your review  Please purchase a probiotic over the counter. You can purchase Clinical cytogeneticist  Your physician has requested that you go to the basement for the following lab work before leaving today: TTG IGA

## 2013-10-18 ENCOUNTER — Telehealth: Payer: Self-pay | Admitting: Gastroenterology

## 2013-10-18 LAB — T-TRANSGLUTAMINASE (TTG) IGG: Tissue Transglut Ab: <2 U/mL (ref 0–5)

## 2013-10-18 NOTE — Telephone Encounter (Signed)
Left a message for patient to call back. 

## 2013-10-18 NOTE — Telephone Encounter (Signed)
Patient is calling for lab results. Please, advise.

## 2013-10-19 NOTE — Telephone Encounter (Signed)
Tye Savoy, NP reviewed labs and they are negative for celiac disease if patient was eating regular diet. Left a message for patient to call back.

## 2013-10-23 ENCOUNTER — Encounter: Payer: Self-pay | Admitting: Gastroenterology

## 2013-10-23 NOTE — Progress Notes (Signed)
     10/23/2013 DIA DONATE [id number] [birth year]   History of Present Illness:  This is a 48 year old female who is followed by Dr. [name] for constipation.  She had a colonoscopy in 2003 which showed a hyperplastic polyp. A repeat colonoscopy in September 2006 for abdominal pain was essentially normal.  She presents to our office today with complaints of ongoing constipation and abdominal bloating.  She says that the bloating has been present more since April and is worse throughout the day.  She has been drinking Miralax mixed with her coffee each day and seems to be moving her bowels well with that regimen.  The bloating is her biggest complaint currently.  Her weight has been perfectly stable.  Denies dark or bloody stool.  CBC and CMP in January of this year were normal.  She has a history of uterine fibroids.  Had an MRI of the pelvis in 07/2013 that revealed decrease in size of the uterus and devascularization of fibroids compared to a prior study.  No other abnormalities were noted.   Current Medications, Allergies, Past Medical History, Past Surgical History, Family History and Social History were reviewed in [employer] record.   Physical Exam: BP 98/56  Pulse 68  Ht 5' 7.5" (1.715 m)  Wt 160 lb 12.8 oz (72.938 kg)  BMI 24.80 kg/m2 General: Well developed black female in no acute distress Head:  Normocephalic and atraumatic Eyes:  Sclerae anicteric, conjunctiva pink  Ears: Normal auditory acuity Lungs: Clear throughout to auscultation Heart: Regular rate and rhythm Abdomen: Soft, non-distended.  Normal bowel sounds.  Minimal diffuse TTP without R/R/G. Musculoskeletal: Symmetrical with no gross deformities  Extremities: No edema  Neurological: Alert oriented x 4, grossly non-focal Psychological:  Alert and cooperative. Normal mood and affect  Assessment and Recommendations: -Abdominal bloating, likely related to her chronic constipation:  Will check  her for celiac disease with lab studies.  I have also recommended that she follow a lactose free diet and see if this helps her complaints of bloating.  She will continue her Miralax mixed with coffee daily and will start a probiotic such as Clinical cytogeneticist.  ? If she would benefit from a course of Xifaxan to treat IBS/SIBO.

## 2013-10-23 NOTE — Progress Notes (Signed)
reviewe and agree. Will hold of on Xifaxan due to expense.

## 2013-10-29 ENCOUNTER — Telehealth: Payer: Self-pay | Admitting: Internal Medicine

## 2013-10-29 NOTE — Telephone Encounter (Signed)
Spoke with patient and gave her celiac results as per Tye Savoy, NP.

## 2013-11-29 ENCOUNTER — Telehealth: Payer: Self-pay | Admitting: Emergency Medicine

## 2013-11-29 NOTE — Telephone Encounter (Signed)
Called pt per Dr Margaretmary Dys request.  Pt had left a message at [facility] w/ c/c of pelvic pain and bloating since the [place] procedure.   Pt states that the bloating has been constant since March/April and the pelvic pain is intermittent.  Pt did f/u with Dr Carren Rang and her GI dr. And everything was neg.  Her GI doc told her to take a probiotic to help with the bloating and constipation at the time.  Constipation is better, but still has the bloating and some pelvic pain.  If Dr Kathlene Cote thinks this is unrelated, then she will not schedule an appt to come into the office.  Pt was ok with Dr Maryan Puls calling her if necessary, at [phone].

## 2013-11-30 ENCOUNTER — Telehealth: Payer: Self-pay | Admitting: Interventional Radiology

## 2013-11-30 NOTE — Telephone Encounter (Signed)
Spoke with patient over phone.  Some bloating and associated mild pelvic discomfort since procedure.  Constipation has improved.  No fever, chills or vaginal discharge.  Has not had a true menstrual cycle since procedure.  Some hot flashes.  Recommended considering pelvic MRI if symptoms worsen or do not resolve.  Doubt that bloating is due to embolization over 1 year ago.  Fibroid embolization has made patient pre-menopausal.  Patient will call clinic if symptoms not resolving over next couple months.

## 2015-07-10 ENCOUNTER — Encounter: Payer: Self-pay | Admitting: Obstetrics

## 2015-07-10 ENCOUNTER — Ambulatory Visit (INDEPENDENT_AMBULATORY_CARE_PROVIDER_SITE_OTHER): Payer: BC Managed Care – PPO | Admitting: Obstetrics

## 2015-07-10 VITALS — BP 128/79 | HR 67 | Wt 171.0 lb

## 2015-07-10 DIAGNOSIS — D251 Intramural leiomyoma of uterus: Secondary | ICD-10-CM

## 2015-07-10 DIAGNOSIS — Z01419 Encounter for gynecological examination (general) (routine) without abnormal findings: Secondary | ICD-10-CM | POA: Diagnosis not present

## 2015-07-10 NOTE — Addendum Note (Signed)
Addended by: Lewie Loron D on: 07/10/2015 04:12 PM   Modules accepted: Orders

## 2015-07-10 NOTE — Progress Notes (Addendum)
Subjective:        Erica [name] is a 50 y.o. female here for a routine exam.  Current complaints: None.    Personal health questionnaire:  Is patient [name], have a family history of breast and/or ovarian cancer: no Is there a family history of uterine cancer diagnosed at age < 64, gastrointestinal cancer, urinary tract cancer, family member who is a Field seismologist syndrome-associated carrier: no Is the patient overweight and hypertensive, family history of diabetes, personal history of gestational diabetes, preeclampsia or PCOS: no Is patient over 22, have PCOS,  family history of premature CHD under age 39, diabetes, smoke, have hypertension or peripheral artery disease:  no At any time, has a partner hit, kicked or otherwise hurt or frightened you?: no Over the past 2 weeks, have you felt down, depressed or hopeless?: no Over the past 2 weeks, have you felt little interest or pleasure in doing things?:no   Gynecologic History No LMP recorded. Patient is not currently having periods (Reason: Other). Contraception: none Last Pap: 2016. Results were: normal Last mammogram: 2016. Results were: normal  Obstetric History OB History  No data available    Past Medical History  Diagnosis Date  . Fibroids   . Essential hypertension, benign   . Hx of adenomatous colonic polyps   . GERD (gastroesophageal reflux disease)   . HH (hiatus hernia)   . IBS (irritable bowel syndrome)     Past Surgical History  Procedure Laterality Date  . Uterine polypectomy    . Colonoscopy  01/01/2005    Normal      Current outpatient prescriptions:  .  hydrochlorothiazide (HYDRODIURIL) 25 MG tablet, Take 25 mg by mouth every evening. , Disp: , Rfl:  .  meloxicam (MOBIC) 7.5 MG tablet, Take 7.5 mg by mouth daily., Disp: , Rfl:  .  Multiple Vitamin (MULTIVITAMIN WITH MINERALS) TABS, Take 1 tablet by mouth daily. Reported on 07/10/2015, Disp: , Rfl:  .  polyvinyl alcohol (LIQUIFILM TEARS) 1.4 %  ophthalmic solution, Place 1 drop into both eyes daily as needed (dry eyes). Reported on 07/10/2015, Disp: , Rfl:  No Known Allergies  Social History  Substance Use Topics  . Smoking status: Never Smoker   . Smokeless tobacco: Never Used  . Alcohol Use: No    Family History  Problem Relation Age of Onset  . Hodgkin's lymphoma Mother   . Alzheimer's disease Father   . Colon cancer Maternal Grandfather   . Stroke Paternal Grandfather       Review of Systems  Constitutional: negative for fatigue and weight loss Respiratory: negative for cough and wheezing Cardiovascular: negative for chest pain, fatigue and palpitations Gastrointestinal: negative for abdominal pain and change in bowel habits Musculoskeletal:negative for myalgias Neurological: negative for gait problems and tremors Behavioral/Psych: negative for abusive relationship, depression Endocrine: negative for temperature intolerance   Genitourinary:negative for abnormal menstrual periods, genital lesions, hot flashes, sexual problems and vaginal discharge Integument/breast: negative for breast lump, breast tenderness, nipple discharge and skin lesion(s)    Objective:       BP 128/79 mmHg  Pulse 67  Wt 171 lb (77.565 kg) General:   alert  Skin:   no rash or abnormalities  Lungs:   clear to auscultation bilaterally  Heart:   regular rate and rhythm, S1, S2 normal, no murmur, click, rub or gallop  Breasts:   normal without suspicious masses, skin or nipple changes or axillary nodes  Abdomen:  normal findings: no organomegaly,  soft, non-tender and no hernia  Pelvis:  External genitalia: normal general appearance Urinary system: urethral meatus normal and bladder without fullness, nontender Vaginal: normal without tenderness, induration or masses Cervix: normal appearance Adnexa: normal bimanual exam Uterus: anteverted and non-tender, normal size   Lab Review Urine pregnancy test Labs reviewed yes Radiologic  studies reviewed yes    Assessment:    Healthy female exam.   H/O fibroids.  S/P UFE in 2014.   Plan:    Education reviewed: calcium supplements, low fat, low cholesterol diet, self breast exams and weight bearing exercise. Follow up in: 1 year.   Meds ordered this encounter  Medications  . meloxicam (MOBIC) 7.5 MG tablet    Sig: Take 7.5 mg by mouth daily.   No orders of the defined types were placed in this encounter.

## 2015-07-14 LAB — NUSWAB VAGINITIS PLUS (VG+)
Candida albicans, NAA: NEGATIVE
Candida glabrata, NAA: NEGATIVE
Chlamydia trachomatis, NAA: NEGATIVE
Neisseria gonorrhoeae, NAA: NEGATIVE
Trich vag by NAA: NEGATIVE

## 2015-07-15 LAB — PAP IG AND HPV HIGH-RISK
HPV, high-risk: NEGATIVE
PAP Smear Comment: 0

## 2015-09-11 ENCOUNTER — Other Ambulatory Visit: Payer: Self-pay | Admitting: Obstetrics

## 2015-09-11 DIAGNOSIS — Z1231 Encounter for screening mammogram for malignant neoplasm of breast: Secondary | ICD-10-CM

## 2015-09-19 ENCOUNTER — Ambulatory Visit
Admission: RE | Admit: 2015-09-19 | Discharge: 2015-09-19 | Disposition: A | Discharge status: Home / Self Care | Payer: BLUE CROSS/BLUE SHIELD | Source: Ambulatory Visit | Attending: Obstetrics | Admitting: Obstetrics

## 2015-09-19 DIAGNOSIS — Z1231 Encounter for screening mammogram for malignant neoplasm of breast: Secondary | ICD-10-CM | POA: Diagnosis not present

## 2015-09-29 DIAGNOSIS — M25561 Pain in right knee: Secondary | ICD-10-CM | POA: Diagnosis not present

## 2015-10-07 DIAGNOSIS — M25561 Pain in right knee: Secondary | ICD-10-CM | POA: Diagnosis not present

## 2015-10-10 DIAGNOSIS — D509 Iron deficiency anemia, unspecified: Secondary | ICD-10-CM | POA: Diagnosis not present

## 2015-10-10 DIAGNOSIS — E559 Vitamin D deficiency, unspecified: Secondary | ICD-10-CM | POA: Diagnosis not present

## 2015-10-20 DIAGNOSIS — M222X1 Patellofemoral disorders, right knee: Secondary | ICD-10-CM | POA: Diagnosis not present

## 2015-10-31 DIAGNOSIS — D1801 Hemangioma of skin and subcutaneous tissue: Secondary | ICD-10-CM | POA: Diagnosis not present

## 2015-10-31 DIAGNOSIS — L814 Other melanin hyperpigmentation: Secondary | ICD-10-CM | POA: Diagnosis not present

## 2015-10-31 DIAGNOSIS — L820 Inflamed seborrheic keratosis: Secondary | ICD-10-CM | POA: Diagnosis not present

## 2016-03-16 DIAGNOSIS — H6121 Impacted cerumen, right ear: Secondary | ICD-10-CM | POA: Diagnosis not present

## 2016-03-16 DIAGNOSIS — I10 Essential (primary) hypertension: Secondary | ICD-10-CM | POA: Diagnosis not present

## 2016-03-16 DIAGNOSIS — D649 Anemia, unspecified: Secondary | ICD-10-CM | POA: Diagnosis not present

## 2016-06-27 DIAGNOSIS — I10 Essential (primary) hypertension: Secondary | ICD-10-CM | POA: Diagnosis not present

## 2016-07-14 ENCOUNTER — Encounter: Payer: Self-pay | Admitting: Obstetrics

## 2016-07-14 ENCOUNTER — Ambulatory Visit (INDEPENDENT_AMBULATORY_CARE_PROVIDER_SITE_OTHER): Payer: BLUE CROSS/BLUE SHIELD | Admitting: Obstetrics

## 2016-07-14 VITALS — BP 115/79 | HR 77 | Wt 168.0 lb

## 2016-07-14 DIAGNOSIS — N393 Stress incontinence (female) (male): Secondary | ICD-10-CM

## 2016-07-14 DIAGNOSIS — Z124 Encounter for screening for malignant neoplasm of cervix: Secondary | ICD-10-CM

## 2016-07-14 DIAGNOSIS — Z01419 Encounter for gynecological examination (general) (routine) without abnormal findings: Secondary | ICD-10-CM | POA: Diagnosis not present

## 2016-07-14 DIAGNOSIS — D251 Intramural leiomyoma of uterus: Secondary | ICD-10-CM

## 2016-07-14 NOTE — Addendum Note (Signed)
Addended by: Tamela Oddi on: 07/14/2016 01:51 PM   Modules accepted: Orders

## 2016-07-14 NOTE — Progress Notes (Signed)
Subjective:        Erica [name] is a 51 y.o. female here for a routine exam.  Current complaints: Leaking of urine with cough, sneeze, etc.  Personal health questionnaire:  Is patient [name], have a family history of breast and/or ovarian cancer: no Is there a family history of uterine cancer diagnosed at age < 76, gastrointestinal cancer, urinary tract cancer, family member who is a Field seismologist syndrome-associated carrier: no Is the patient overweight and hypertensive, family history of diabetes, personal history of gestational diabetes, preeclampsia or PCOS: no Is patient over 53, have PCOS,  family history of premature CHD under age 44, diabetes, smoke, have hypertension or peripheral artery disease:  no At any time, has a partner hit, kicked or otherwise hurt or frightened you?: no Over the past 2 weeks, have you felt down, depressed or hopeless?: no Over the past 2 weeks, have you felt little interest or pleasure in doing things?:no   Gynecologic History LMP:  2014.  Had Uterine Artery Embolization in 2014 for fibroids and AUB.  No periods after procedure. Contraception: abstinence Last Pap: 2017. Results were: normal Last mammogram: 2018. Results were: normal  Obstetric History OB History  No data available    Past Medical History:  Diagnosis Date  . Essential hypertension, benign   . Fibroids   . GERD (gastroesophageal reflux disease)   . HH (hiatus hernia)   . Hx of adenomatous colonic polyps   . IBS (irritable bowel syndrome)     Past Surgical History:  Procedure Laterality Date  . COLONOSCOPY  01/01/2005   Normal   . uterine polypectomy       Current Outpatient Prescriptions:  .  FeFum-FePoly-FA-B Cmp-C-Biot (INTEGRA PLUS PO), Take by mouth every morning., Disp: , Rfl:  .  hydrochlorothiazide (HYDRODIURIL) 25 MG tablet, Take 25 mg by mouth every evening. , Disp: , Rfl:  .  lisinopril (PRINIVIL,ZESTRIL) 10 MG tablet, Take 10 mg by mouth daily., Disp:  , Rfl:  .  Multiple Vitamin (MULTIVITAMIN WITH MINERALS) TABS, Take 1 tablet by mouth daily. Reported on 07/10/2015, Disp: , Rfl:  .  polyvinyl alcohol (LIQUIFILM TEARS) 1.4 % ophthalmic solution, Place 1 drop into both eyes daily as needed (dry eyes). Reported on 07/10/2015, Disp: , Rfl:  .  meloxicam (MOBIC) 7.5 MG tablet, Take 7.5 mg by mouth daily., Disp: , Rfl:  No Known Allergies  Social History  Substance Use Topics  . Smoking status: Never Smoker  . Smokeless tobacco: Never Used  . Alcohol use No    Family History  Problem Relation Age of Onset  . Hodgkin's lymphoma Mother   . Alzheimer's disease Father   . Colon cancer Maternal Grandfather   . Stroke Paternal Grandfather       Review of Systems  Constitutional: negative for fatigue and weight loss Respiratory: negative for cough and wheezing Cardiovascular: negative for chest pain, fatigue and palpitations Gastrointestinal: negative for abdominal pain and change in bowel habits Musculoskeletal:negative for myalgias Neurological: negative for gait problems and tremors Behavioral/Psych: negative for abusive relationship, depression Endocrine: negative for temperature intolerance    Genitourinary:negative for abnormal menstrual periods, genital lesions, hot flashes, sexual problems and vaginal discharge.  Positive for leaking of urine with cough and sneeze Integument/breast: negative for breast lump, breast tenderness, nipple discharge and skin lesion(s)    Objective:       BP 115/79   Pulse 77   Wt 168 lb (76.2 kg)   LMP  (  LMP Unknown) Comment: 2014  BMI 25.92 kg/m  General:   alert  Skin:   no rash or abnormalities  Lungs:   clear to auscultation bilaterally  Heart:   regular rate and rhythm, S1, S2 normal, no murmur, click, rub or gallop  Breasts:   normal without suspicious masses, skin or nipple changes or axillary nodes  Abdomen:  normal findings: no organomegaly, soft, non-tender and no hernia  Pelvis:   External genitalia: normal general appearance Urinary system: urethral meatus normal and bladder without fullness, nontender Vaginal: normal without tenderness, induration or masses Cervix: normal appearance Adnexa: normal bimanual exam Uterus: anteverted and non-tender, enlarged ~ 10 weeks size   Lab Review Urine pregnancy test Labs reviewed yes Radiologic studies reviewed yes  50% of 20 min visit spent on counseling and coordination of care.    Assessment:    Healthy female exam.    Uterine fibroids.  Stable after Uterine Fibroid Embolization.  No periods or pain after procedure.  SUI   Plan:   Referred to Urogynecology for SUI  Education reviewed: calcium supplements, depression evaluation, low fat, low cholesterol diet, safe sex/STD prevention, self breast exams and weight bearing exercise. Follow up in: 1 year.   Meds ordered this encounter  Medications  . lisinopril (PRINIVIL,ZESTRIL) 10 MG tablet    Sig: Take 10 mg by mouth daily.  Marland Kitchen FeFum-FePoly-FA-B Cmp-C-Biot (INTEGRA PLUS PO)    Sig: Take by mouth every morning.   Orders Placed This Encounter  Procedures  . Ambulatory referral to Urogynecology    Referral Priority:   Routine    Referral Type:   Consultation    Referral Reason:   Specialty Services Required    Requested Specialty:   Urology    Number of Visits Requested:   1     Patient ID: Erica [name], female   DOB: 03-Sep-1965, 51 y.o.   MRN: [id number]

## 2016-07-14 NOTE — Progress Notes (Signed)
Patient presents for Annual Exam. Complains of smelling fishy whenever she eats [place].

## 2016-07-15 LAB — CERVICOVAGINAL ANCILLARY ONLY
Bacterial vaginitis: NEGATIVE
Candida vaginitis: NEGATIVE
Chlamydia: NEGATIVE
Neisseria Gonorrhea: NEGATIVE
Trichomonas: NEGATIVE

## 2016-07-16 DIAGNOSIS — M79641 Pain in right hand: Secondary | ICD-10-CM | POA: Diagnosis not present

## 2016-07-16 DIAGNOSIS — I10 Essential (primary) hypertension: Secondary | ICD-10-CM | POA: Diagnosis not present

## 2016-07-16 DIAGNOSIS — M79642 Pain in left hand: Secondary | ICD-10-CM | POA: Diagnosis not present

## 2016-07-16 LAB — URINE CULTURE

## 2016-07-19 LAB — CYTOLOGY - PAP
Diagnosis: NEGATIVE
HPV: NOT DETECTED

## 2016-08-17 DIAGNOSIS — I10 Essential (primary) hypertension: Secondary | ICD-10-CM | POA: Diagnosis not present

## 2016-08-17 DIAGNOSIS — Z79899 Other long term (current) drug therapy: Secondary | ICD-10-CM | POA: Diagnosis not present

## 2016-08-18 DIAGNOSIS — M150 Primary generalized (osteo)arthritis: Secondary | ICD-10-CM | POA: Diagnosis not present

## 2016-08-18 DIAGNOSIS — M2550 Pain in unspecified joint: Secondary | ICD-10-CM | POA: Diagnosis not present

## 2016-08-24 ENCOUNTER — Other Ambulatory Visit: Payer: Self-pay | Admitting: Family Medicine

## 2016-08-24 DIAGNOSIS — Z1231 Encounter for screening mammogram for malignant neoplasm of breast: Secondary | ICD-10-CM

## 2016-09-22 DIAGNOSIS — R200 Anesthesia of skin: Secondary | ICD-10-CM | POA: Diagnosis not present

## 2016-09-22 DIAGNOSIS — R208 Other disturbances of skin sensation: Secondary | ICD-10-CM | POA: Diagnosis not present

## 2016-09-24 ENCOUNTER — Ambulatory Visit
Admission: RE | Admit: 2016-09-24 | Discharge: 2016-09-24 | Disposition: A | Discharge status: Home / Self Care | Payer: BLUE CROSS/BLUE SHIELD | Source: Ambulatory Visit | Attending: Family Medicine | Admitting: Family Medicine

## 2016-09-24 DIAGNOSIS — Z1231 Encounter for screening mammogram for malignant neoplasm of breast: Secondary | ICD-10-CM

## 2016-09-29 ENCOUNTER — Encounter: Payer: Self-pay | Admitting: *Deleted

## 2016-09-29 DIAGNOSIS — N393 Stress incontinence (female) (male): Secondary | ICD-10-CM | POA: Insufficient documentation

## 2016-09-29 DIAGNOSIS — Z86010 Personal history of colonic polyps: Secondary | ICD-10-CM | POA: Insufficient documentation

## 2016-09-29 HISTORY — DX: Stress incontinence (female) (male): N39.3

## 2016-10-05 ENCOUNTER — Encounter: Payer: Self-pay | Admitting: Obstetrics

## 2016-10-05 ENCOUNTER — Ambulatory Visit (INDEPENDENT_AMBULATORY_CARE_PROVIDER_SITE_OTHER): Payer: BLUE CROSS/BLUE SHIELD | Admitting: Obstetrics

## 2016-10-05 VITALS — BP 117/79 | HR 93 | Wt 165.0 lb

## 2016-10-05 DIAGNOSIS — Z113 Encounter for screening for infections with a predominantly sexual mode of transmission: Secondary | ICD-10-CM | POA: Diagnosis not present

## 2016-10-05 DIAGNOSIS — D251 Intramural leiomyoma of uterus: Secondary | ICD-10-CM

## 2016-10-05 DIAGNOSIS — N393 Stress incontinence (female) (male): Secondary | ICD-10-CM

## 2016-10-05 DIAGNOSIS — N898 Other specified noninflammatory disorders of vagina: Secondary | ICD-10-CM

## 2016-10-05 MED ORDER — METRONIDAZOLE 0.75 % VA GEL: 1.0000 | Freq: Two times a day (BID) | VAGINAL | 2 refills | Status: DC

## 2016-10-05 NOTE — Progress Notes (Signed)
Patient ID: Erica [name], female   DOB: 1966-03-26, 51 y.o.   MRN: [id number]  Chief Complaint  Patient presents with  . Vaginitis    pt complaints of vag odor, then used douche and had some spotting after use.     HPI Erica [name] is a 51 y.o. female.  Vaginal odor and irritation. HPI  Past Medical History:  Diagnosis Date  . Essential hypertension, benign   . Fibroids   . GERD (gastroesophageal reflux disease)   . HH (hiatus hernia)   . Hx of adenomatous colonic polyps   . IBS (irritable bowel syndrome)     Past Surgical History:  Procedure Laterality Date  . COLONOSCOPY  01/01/2005   Normal   . uterine polypectomy      Family History  Problem Relation Age of Onset  . Hodgkin's lymphoma Mother   . Alzheimer's disease Father   . Colon cancer Maternal Grandfather   . Stroke Paternal Grandfather   . Breast cancer Sister   . Breast cancer Maternal Aunt     Social History Social History  Substance Use Topics  . Smoking status: Never Smoker  . Smokeless tobacco: Never Used  . Alcohol use No    No Known Allergies  Current Outpatient Prescriptions  Medication Sig Dispense Refill  . FeFum-FePoly-FA-B Cmp-C-Biot (INTEGRA PLUS PO) Take by mouth every morning.    . hydrochlorothiazide (HYDRODIURIL) 25 MG tablet Take 25 mg by mouth every evening.     Marland Kitchen lisinopril (PRINIVIL,ZESTRIL) 10 MG tablet Take 10 mg by mouth daily.    . meloxicam (MOBIC) 7.5 MG tablet Take 7.5 mg by mouth daily.    . metroNIDAZOLE (METROGEL VAGINAL) 0.75 % vaginal gel Place 1 Applicatorful vaginally 2 (two) times daily. 70 g 2  . Multiple Vitamin (MULTIVITAMIN WITH MINERALS) TABS Take 1 tablet by mouth daily. Reported on 07/10/2015    . polyvinyl alcohol (LIQUIFILM TEARS) 1.4 % ophthalmic solution Place 1 drop into both eyes daily as needed (dry eyes). Reported on 07/10/2015     No current facility-administered medications for this visit.     Review of Systems Review of  Systems Constitutional: negative for fatigue and weight loss Respiratory: negative for cough and wheezing Cardiovascular: negative for chest pain, fatigue and palpitations Gastrointestinal: negative for abdominal pain and change in bowel habits Genitourinary:positive for vaginal odor and irritation.  Leaks urine with valsalva Integument/breast: negative for nipple discharge Musculoskeletal:negative for myalgias Neurological: negative for gait problems and tremors Behavioral/Psych: negative for abusive relationship, depression Endocrine: negative for temperature intolerance      Blood pressure 117/79, pulse 93, weight 165 lb (74.8 kg), last menstrual period 11/02/2012.  Physical Exam Physical Exam           General:  Alert and no distress Abdomen:  normal findings: no organomegaly, soft, non-tender and no hernia  Pelvis:  External genitalia: normal general appearance Urinary system: urethral meatus normal and bladder without fullness, nontender Vaginal: normal without tenderness, induration or masses Cervix: normal appearance Adnexa: normal bimanual exam Uterus: anteverted and non-tender, normal size    50% of 15 min visit spent on counseling and coordination of care.    Data Reviewed Wet prep  Assessment     1. Vaginal irritation Rx: - Cervicovaginal ancillary only - metroNIDAZOLE (METROGEL VAGINAL) 0.75 % vaginal gel; Place 1 Applicatorful vaginally 2 (two) times daily.  Dispense: 70 g; Refill: 2  2. Vaginal discharge Rx: - Cervicovaginal ancillary only  3. Fibroids, intramural Rx: -  US Pelvis Complete; Future - US Transvaginal Non-OB; Future  4. SUI (stress urinary incontinence, female) - followed by Urogynecology     Plan    Follow up prn  Orders Placed This Encounter  Procedures  . US Pelvis Complete    Standing Status:   Future    Standing Expiration Date:   12/05/2017    Order Specific Question:   Reason for Exam (SYMPTOM  OR DIAGNOSIS REQUIRED)     Answer:   Fibroids    Order Specific Question:   Preferred imaging location?    Answer:   [facility]  . US Transvaginal Non-OB    Standing Status:   Future    Standing Expiration Date:   12/05/2017    Order Specific Question:   Reason for Exam (SYMPTOM  OR DIAGNOSIS REQUIRED)    Answer:   Fibroids    Order Specific Question:   Preferred imaging location?    Answer:   [facility]   Meds ordered this encounter  Medications  . metroNIDAZOLE (METROGEL VAGINAL) 0.75 % vaginal gel    Sig: Place 1 Applicatorful vaginally 2 (two) times daily.    Dispense:  70 g    Refill:  2

## 2016-10-06 LAB — CERVICOVAGINAL ANCILLARY ONLY
Bacterial vaginitis: NEGATIVE
Candida vaginitis: NEGATIVE
Trichomonas: POSITIVE — AB

## 2016-10-07 ENCOUNTER — Other Ambulatory Visit: Payer: Self-pay | Admitting: Obstetrics

## 2016-10-07 DIAGNOSIS — A5901 Trichomonal vulvovaginitis: Secondary | ICD-10-CM

## 2016-10-07 MED ORDER — METRONIDAZOLE 500 MG PO TABS: 2000.0000 mg | ORAL_TABLET | Freq: Once | ORAL | 0 refills | Status: AC

## 2016-10-10 DIAGNOSIS — M8580 Other specified disorders of bone density and structure, unspecified site: Secondary | ICD-10-CM

## 2016-10-10 HISTORY — DX: Other specified disorders of bone density and structure, unspecified site: M85.80

## 2016-10-15 DIAGNOSIS — M79672 Pain in left foot: Secondary | ICD-10-CM | POA: Diagnosis not present

## 2016-10-15 DIAGNOSIS — B351 Tinea unguium: Secondary | ICD-10-CM | POA: Diagnosis not present

## 2016-10-15 DIAGNOSIS — M79671 Pain in right foot: Secondary | ICD-10-CM | POA: Diagnosis not present

## 2016-10-19 ENCOUNTER — Ambulatory Visit (HOSPITAL_COMMUNITY)
Admission: RE | Admit: 2016-10-19 | Discharge: 2016-10-19 | Disposition: A | Discharge status: Home / Self Care | Payer: BLUE CROSS/BLUE SHIELD | Source: Ambulatory Visit | Attending: Obstetrics | Admitting: Obstetrics

## 2016-10-19 DIAGNOSIS — D259 Leiomyoma of uterus, unspecified: Secondary | ICD-10-CM | POA: Diagnosis not present

## 2016-10-19 DIAGNOSIS — D251 Intramural leiomyoma of uterus: Secondary | ICD-10-CM

## 2016-10-22 DIAGNOSIS — E559 Vitamin D deficiency, unspecified: Secondary | ICD-10-CM | POA: Diagnosis not present

## 2016-10-25 ENCOUNTER — Telehealth: Payer: Self-pay | Admitting: *Deleted

## 2016-10-25 NOTE — Telephone Encounter (Signed)
Patient states she has been calling for a week to get her [place] results and she can not get through.

## 2016-10-26 ENCOUNTER — Other Ambulatory Visit: Payer: Self-pay | Admitting: Obstetrics

## 2016-11-01 ENCOUNTER — Ambulatory Visit (INDEPENDENT_AMBULATORY_CARE_PROVIDER_SITE_OTHER): Payer: BLUE CROSS/BLUE SHIELD | Admitting: Obstetrics

## 2016-11-01 ENCOUNTER — Encounter: Payer: Self-pay | Admitting: Obstetrics

## 2016-11-01 VITALS — BP 148/88 | HR 79 | Wt 169.6 lb

## 2016-11-01 DIAGNOSIS — Z8719 Personal history of other diseases of the digestive system: Secondary | ICD-10-CM

## 2016-11-01 DIAGNOSIS — Z0000 Encounter for general adult medical examination without abnormal findings: Secondary | ICD-10-CM

## 2016-11-01 DIAGNOSIS — R102 Pelvic and perineal pain: Secondary | ICD-10-CM

## 2016-11-01 DIAGNOSIS — A5901 Trichomonal vulvovaginitis: Secondary | ICD-10-CM | POA: Diagnosis not present

## 2016-11-01 DIAGNOSIS — M549 Dorsalgia, unspecified: Secondary | ICD-10-CM

## 2016-11-01 DIAGNOSIS — Z113 Encounter for screening for infections with a predominantly sexual mode of transmission: Secondary | ICD-10-CM | POA: Diagnosis not present

## 2016-11-01 DIAGNOSIS — K5901 Slow transit constipation: Secondary | ICD-10-CM

## 2016-11-01 DIAGNOSIS — Z780 Asymptomatic menopausal state: Secondary | ICD-10-CM

## 2016-11-01 LAB — POCT URINALYSIS DIPSTICK
Bilirubin, UA: NEGATIVE
Blood, UA: NEGATIVE
Glucose, UA: NEGATIVE
Ketones, UA: NEGATIVE
Leukocytes, UA: NEGATIVE
Nitrite, UA: NEGATIVE
Protein, UA: NEGATIVE
Spec Grav, UA: 1.015 (ref 1.010–1.025)
Urobilinogen, UA: NEGATIVE E.U./dL — AB
pH, UA: 6 (ref 5.0–8.0)

## 2016-11-01 MED ORDER — IBUPROFEN 800 MG PO TABS: 800.0000 mg | ORAL_TABLET | Freq: Three times a day (TID) | ORAL | 5 refills | Status: DC

## 2016-11-01 MED ORDER — DOCUSATE SODIUM 100 MG PO CAPS: 100.0000 mg | ORAL_CAPSULE | Freq: Two times a day (BID) | ORAL | 11 refills | Status: DC

## 2016-11-01 MED ORDER — CYCLOBENZAPRINE HCL 10 MG PO TABS: 10.0000 mg | ORAL_TABLET | Freq: Three times a day (TID) | ORAL | 2 refills | Status: DC

## 2016-11-01 NOTE — Progress Notes (Signed)
Patient is in the office for repeat wet prep. She is also here to review [place] results.  Patient is having some pelvic pressure and back pain.

## 2016-11-01 NOTE — Progress Notes (Signed)
Patient ID: Erica [name], female   DOB: Aug 20, 1965, 51 y.o.   MRN: [id number]  Chief Complaint  Patient presents with  . Gynecologic Exam    HPI Erica [name] is a 51 y.o. female.  Patient had positive Trichomonas on Wet Prep in June 2017.  She has not had intercourse for over a year. She was seen in April 2018 and had a negative Wet Prep and Pap Smear with no intercourse after April 2018.  Presents today for repeat Wet Prep to R/O lab error.  Also complains of low backache , pelvic pressure and abdominal bloating.  Recent ultrasound remarkable for fibroids.  She is postmenopausal. Has history of IBS. Not taking meds. HPI  Past Medical History:  Diagnosis Date  . Essential hypertension, benign   . Fibroids   . GERD (gastroesophageal reflux disease)   . HH (hiatus hernia)   . Hx of adenomatous colonic polyps   . IBS (irritable bowel syndrome)     Past Surgical History:  Procedure Laterality Date  . COLONOSCOPY  01/01/2005   Normal   . uterine polypectomy      Family History  Problem Relation Age of Onset  . Hodgkin's lymphoma Mother   . Alzheimer's disease Father   . Colon cancer Maternal Grandfather   . Stroke Paternal Grandfather   . Breast cancer Sister   . Breast cancer Maternal Aunt     Social History Social History  Substance Use Topics  . Smoking status: Never Smoker  . Smokeless tobacco: Never Used  . Alcohol use No    No Known Allergies  Current Outpatient Prescriptions  Medication Sig Dispense Refill  . cyclobenzaprine (FLEXERIL) 10 MG tablet Take 1 tablet (10 mg total) by mouth every 8 (eight) hours. 30 tablet 2  . docusate sodium (COLACE) 100 MG capsule Take 1 capsule (100 mg total) by mouth 2 (two) times daily. 60 capsule 11  . FeFum-FePoly-FA-B Cmp-C-Biot (INTEGRA PLUS PO) Take by mouth every morning.    . hydrochlorothiazide (HYDRODIURIL) 25 MG tablet Take 25 mg by mouth every evening.     Marland Kitchen ibuprofen (ADVIL,MOTRIN) 800 MG tablet Take 1 tablet (800  mg total) by mouth every 8 (eight) hours. 30 tablet 5  . lisinopril (PRINIVIL,ZESTRIL) 10 MG tablet Take 10 mg by mouth daily.    . meloxicam (MOBIC) 7.5 MG tablet Take 7.5 mg by mouth daily.    . metroNIDAZOLE (METROGEL VAGINAL) 0.75 % vaginal gel Place 1 Applicatorful vaginally 2 (two) times daily. 70 g 2  . Multiple Vitamin (MULTIVITAMIN WITH MINERALS) TABS Take 1 tablet by mouth daily. Reported on 07/10/2015    . polyvinyl alcohol (LIQUIFILM TEARS) 1.4 % ophthalmic solution Place 1 drop into both eyes daily as needed (dry eyes). Reported on 07/10/2015     No current facility-administered medications for this visit.     Review of Systems Review of Systems Constitutional: negative for fatigue and weight loss Respiratory: negative for cough and wheezing Cardiovascular: negative for chest pain, fatigue and palpitations Gastrointestinal: negative for abdominal pain and change in bowel habits Genitourinary:negative Integument/breast: negative for nipple discharge Musculoskeletal:negative for myalgias Neurological: negative for gait problems and tremors Behavioral/Psych: negative for abusive relationship, depression Endocrine: negative for temperature intolerance      Blood pressure (!) 148/88, pulse 79, weight 169 lb 9.6 oz (76.9 kg), last menstrual period 11/02/2012.  Physical Exam Physical Exam           General:  Alert and no distress Abdomen:  normal findings: no organomegaly, soft, non-tender and no hernia  Pelvis:  External genitalia: normal general appearance Urinary system: urethral meatus normal and bladder without fullness, nontender Vaginal: normal without tenderness, induration or masses Cervix: normal appearance Adnexa: normal bimanual exam Uterus: anteverted and non-tender, normal size    50% of 15 min visit spent on counseling and coordination of care.    Data Reviewed Labs  Assessment and Plan:    1. Screen for STD (sexually transmitted disease) - wet prep  repeated  2. Vaginitis due to Trichomonas Rx: - Cervicovaginal ancillary only  3. Pelvic pressure in female Rx: - POCT urinalysis dipstick - cyclobenzaprine (FLEXERIL) 10 MG tablet; Take 1 tablet (10 mg total) by mouth every 8 (eight) hours.  Dispense: 30 tablet; Refill: 2 - ibuprofen (ADVIL,MOTRIN) 800 MG tablet; Take 1 tablet (800 mg total) by mouth every 8 (eight) hours.  Dispense: 30 tablet; Refill: 5  4. Other acute back pain Rx: - cyclobenzaprine (FLEXERIL) 10 MG tablet; Take 1 tablet (10 mg total) by mouth every 8 (eight) hours.  Dispense: 30 tablet; Refill: 2 - ibuprofen (ADVIL,MOTRIN) 800 MG tablet; Take 1 tablet (800 mg total) by mouth every 8 (eight) hours.  Dispense: 30 tablet; Refill: 5  5. History of IBS Rx: - Ambulatory referral to Gastroenterology  6. Routine adult health maintenance Rx: - Ambulatory referral to Internal Medicine  7. Menopause Rx: - DG BONE DENSITY (DXA); Future  8. Slow transit constipation Rx: - docusate sodium (COLACE) 100 MG capsule; Take 1 capsule (100 mg total) by mouth 2 (two) times daily.  Dispense: 60 capsule; Refill: 11    Plan    Orders Placed This Encounter  Procedures  . DG BONE DENSITY (DXA)    [employer]BS// PF:  NONE/ NO SUPP/ NO NEEDS  169LBS/ NO NEEDS/EPIC ORDER JTB/  Junious Silk   [phone]    Standing Status:   Future    Standing Expiration Date:   01/02/2018    Order Specific Question:   Reason for Exam (SYMPTOM  OR DIAGNOSIS REQUIRED)    Answer:   Menopause    Order Specific Question:   Is the patient pregnant?    Answer:   No    Order Specific Question:   Preferred imaging location?    Answer:   [facility]  . Ambulatory referral to Internal Medicine    Referral Priority:   Routine    Referral Type:   Consultation    Referral Reason:   Specialty Services Required    Requested Specialty:   Internal Medicine    Number of Visits Requested:   1  . Ambulatory referral to Gastroenterology    Referral Priority:    Routine    Referral Type:   Consultation    Referral Reason:   Specialty Services Required    Number of Visits Requested:   1  . POCT urinalysis dipstick   Meds ordered this encounter  Medications  . cyclobenzaprine (FLEXERIL) 10 MG tablet    Sig: Take 1 tablet (10 mg total) by mouth every 8 (eight) hours.    Dispense:  30 tablet    Refill:  2  . ibuprofen (ADVIL,MOTRIN) 800 MG tablet    Sig: Take 1 tablet (800 mg total) by mouth every 8 (eight) hours.    Dispense:  30 tablet    Refill:  5  . docusate sodium (COLACE) 100 MG capsule    Sig: Take 1 capsule (100 mg total) by mouth 2 (two) times daily.  Dispense:  60 capsule    Refill:  11         Patient ID: Trixie Deis, female   DOB: 01-14-1966, 51 y.o.   MRN: [id number]

## 2016-11-02 DIAGNOSIS — M545 Low back pain: Secondary | ICD-10-CM | POA: Diagnosis not present

## 2016-11-02 DIAGNOSIS — R200 Anesthesia of skin: Secondary | ICD-10-CM | POA: Diagnosis not present

## 2016-11-02 DIAGNOSIS — K5900 Constipation, unspecified: Secondary | ICD-10-CM | POA: Diagnosis not present

## 2016-11-02 LAB — CERVICOVAGINAL ANCILLARY ONLY
Bacterial vaginitis: NEGATIVE
Candida vaginitis: NEGATIVE
Trichomonas: NEGATIVE

## 2016-11-15 ENCOUNTER — Encounter: Payer: Self-pay | Admitting: Obstetrics

## 2016-11-15 ENCOUNTER — Ambulatory Visit (INDEPENDENT_AMBULATORY_CARE_PROVIDER_SITE_OTHER): Payer: BLUE CROSS/BLUE SHIELD | Admitting: Obstetrics

## 2016-11-15 VITALS — BP 132/84 | HR 80 | Ht 67.5 in | Wt 171.6 lb

## 2016-11-15 DIAGNOSIS — Z0000 Encounter for general adult medical examination without abnormal findings: Secondary | ICD-10-CM

## 2016-11-15 DIAGNOSIS — Z113 Encounter for screening for infections with a predominantly sexual mode of transmission: Secondary | ICD-10-CM

## 2016-11-15 NOTE — Progress Notes (Signed)
Patient is in the office for follow up to go over test results.

## 2016-11-15 NOTE — Patient Instructions (Addendum)
Bacterial Vaginosis Bacterial vaginosis is a vaginal infection that occurs when the normal balance of bacteria in the vagina is disrupted. It results from an overgrowth of certain bacteria. This is the most common vaginal infection among women ages 15-44. Because bacterial vaginosis increases your risk for STIs (sexually transmitted infections), getting treated can help reduce your risk for chlamydia, gonorrhea, herpes, and HIV (human immunodeficiency virus). Treatment is also important for preventing complications in pregnant women, because this condition can cause an early (premature) delivery. What are the causes? This condition is caused by an increase in harmful bacteria that are normally present in small amounts in the vagina. However, the reason that the condition develops is not fully understood. What increases the risk? The following factors may make you more likely to develop this condition:  Having a new sexual partner or multiple sexual partners.  Having unprotected sex.  Douching.  Having an intrauterine device (IUD).  Smoking.  Drug and alcohol abuse.  Taking certain antibiotic medicines.  Being pregnant.  You cannot get bacterial vaginosis from toilet seats, bedding, swimming pools, or contact with objects around you. What are the signs or symptoms? Symptoms of this condition include:  Grey or white vaginal discharge. The discharge can also be watery or foamy.  A fish-like odor with discharge, especially after sexual intercourse or during menstruation.  Itching in and around the vagina.  Burning or pain with urination.  Some women with bacterial vaginosis have no signs or symptoms. How is this diagnosed? This condition is diagnosed based on:  Your medical history.  A physical exam of the vagina.  Testing a sample of vaginal fluid under a microscope to look for a large amount of bad bacteria or abnormal cells. Your health care provider may use a cotton swab  or a small wooden spatula to collect the sample.  How is this treated? This condition is treated with antibiotics. These may be given as a pill, a vaginal cream, or a medicine that is put into the vagina (suppository). If the condition comes back after treatment, a second round of antibiotics may be needed. Follow these instructions at home: Medicines  Take over-the-counter and prescription medicines only as told by your health care provider.  Take or use your antibiotic as told by your health care provider. Do not stop taking or using the antibiotic even if you start to feel better. General instructions  If you have a female sexual partner, tell her that you have a vaginal infection. She should see her health care provider and be treated if she has symptoms. If you have a female sexual partner, he does not need treatment.  During treatment: ? Avoid sexual activity until you finish treatment. ? Do not douche. ? Avoid alcohol as directed by your health care provider. ? Avoid breastfeeding as directed by your health care provider.  Drink enough water and fluids to keep your urine clear or pale yellow.  Keep the area around your vagina and rectum clean. ? Wash the area daily with warm water. ? Wipe yourself from front to back after using the toilet.  Keep all follow-up visits as told by your health care provider. This is important. How is this prevented?  Do not douche.  Wash the outside of your vagina with warm water only.  Use protection when having sex. This includes latex condoms and dental dams.  Limit how many sexual partners you have. To help prevent bacterial vaginosis, it is best to have sex with just   one partner (monogamous).  Make sure you and your sexual partner are tested for STIs.  Wear cotton or cotton-lined underwear.  Avoid wearing tight pants and pantyhose, especially during summer.  Limit the amount of alcohol that you drink.  Do not use any products that  contain nicotine or tobacco, such as cigarettes and e-cigarettes. If you need help quitting, ask your health care provider.  Do not use illegal drugs. Where to find more information:  Centers for Disease Control and Prevention: www.cdc.gov/std  American Sexual Health Association (ASHA): www.ashastd.org  U.S. Department of Health and Human Services, Office on Women's Health: www.womenshealth.gov/ or https://www.womenshealth.gov/a-z-topics/bacterial-vaginosis Contact a health care provider if:  Your symptoms do not improve, even after treatment.  You have more discharge or pain when urinating.  You have a fever.  You have pain in your abdomen.  You have pain during sex.  You have vaginal bleeding between periods. Summary  Bacterial vaginosis is a vaginal infection that occurs when the normal balance of bacteria in the vagina is disrupted.  Because bacterial vaginosis increases your risk for STIs (sexually transmitted infections), getting treated can help reduce your risk for chlamydia, gonorrhea, herpes, and HIV (human immunodeficiency virus). Treatment is also important for preventing complications in pregnant women, because the condition can cause an early (premature) delivery.  This condition is treated with antibiotic medicines. These may be given as a pill, a vaginal cream, or a medicine that is put into the vagina (suppository). This information is not intended to replace advice given to you by your health care provider. Make sure you discuss any questions you have with your health care provider. Document Released: 03/29/2005 Document Revised: 12/13/2015 Document Reviewed: 12/13/2015 Elsevier Interactive Patient Education  2017 Elsevier Inc.  

## 2016-11-16 ENCOUNTER — Encounter: Payer: Self-pay | Admitting: Obstetrics

## 2016-11-16 NOTE — Progress Notes (Signed)
Patient ID: Erica [name], female   DOB: 1965/05/27, 51 y.o.   MRN: [id number]  Chief Complaint  Patient presents with  . Follow-up    HPI Erica [name] is a 51 y.o. female.  Had positive wet prep for Trichomonas in June 2018.  Has not had intercourse for over a year and had a negative wet prep and pap in April 2018.  Wet prep repeated.  Presents today for results of repeat wet prep. HPI  Past Medical History:  Diagnosis Date  . Essential hypertension, benign   . Fibroids   . GERD (gastroesophageal reflux disease)   . HH (hiatus hernia)   . Hx of adenomatous colonic polyps   . IBS (irritable bowel syndrome)     Past Surgical History:  Procedure Laterality Date  . COLONOSCOPY  01/01/2005   Normal   . uterine polypectomy      Family History  Problem Relation Age of Onset  . Hodgkin's lymphoma Mother   . Alzheimer's disease Father   . Colon cancer Maternal Grandfather   . Stroke Paternal Grandfather   . Breast cancer Sister   . Breast cancer Maternal Aunt     Social History Social History  Substance Use Topics  . Smoking status: Never Smoker  . Smokeless tobacco: Never Used  . Alcohol use No    No Known Allergies  Current Outpatient Prescriptions  Medication Sig Dispense Refill  . cyclobenzaprine (FLEXERIL) 10 MG tablet Take 1 tablet (10 mg total) by mouth every 8 (eight) hours. 30 tablet 2  . docusate sodium (COLACE) 100 MG capsule Take 1 capsule (100 mg total) by mouth 2 (two) times daily. 60 capsule 11  . FeFum-FePoly-FA-B Cmp-C-Biot (INTEGRA PLUS PO) Take by mouth every morning.    . hydrochlorothiazide (HYDRODIURIL) 25 MG tablet Take 25 mg by mouth every evening.     Marland Kitchen ibuprofen (ADVIL,MOTRIN) 800 MG tablet Take 1 tablet (800 mg total) by mouth every 8 (eight) hours. 30 tablet 5  . lisinopril (PRINIVIL,ZESTRIL) 10 MG tablet Take 10 mg by mouth daily.    . meloxicam (MOBIC) 7.5 MG tablet Take 7.5 mg by mouth daily.    . metroNIDAZOLE (METROGEL VAGINAL) 0.75 %  vaginal gel Place 1 Applicatorful vaginally 2 (two) times daily. 70 g 2  . Multiple Vitamin (MULTIVITAMIN WITH MINERALS) TABS Take 1 tablet by mouth daily. Reported on 07/10/2015    . polyvinyl alcohol (LIQUIFILM TEARS) 1.4 % ophthalmic solution Place 1 drop into both eyes daily as needed (dry eyes). Reported on 07/10/2015     No current facility-administered medications for this visit.     Review of Systems Review of Systems Constitutional: negative for fatigue and weight loss Respiratory: negative for cough and wheezing Cardiovascular: negative for chest pain, fatigue and palpitations Gastrointestinal: negative for abdominal pain and change in bowel habits Genitourinary:negative Integument/breast: negative for nipple discharge Musculoskeletal:negative for myalgias Neurological: negative for gait problems and tremors Behavioral/Psych: negative for abusive relationship, depression Endocrine: negative for temperature intolerance      Blood pressure 132/84, pulse 80, height 5' 7.5" (1.715 m), weight 171 lb 9.6 oz (77.8 kg), last menstrual period 11/02/2012.  Physical Exam Physical Exam:  Deferred  >50% of 10 min visit spent on counseling and coordination of care.    Data Reviewed Wet Prep  Assessment     1. Screen for STD (sexually transmitted disease) - negative for Trichomonas  2. Routine adult health maintenance Rx: - Ambulatory referral to Internal Medicine  Plan    Follow up prn   Orders Placed This Encounter  Procedures  . Ambulatory referral to Internal Medicine    Referral Priority:   Routine    Referral Type:   Consultation    Referral Reason:   Specialty Services Required    Requested Specialty:   Internal Medicine    Number of Visits Requested:   1   No orders of the defined types were placed in this encounter.

## 2016-11-18 ENCOUNTER — Ambulatory Visit
Admission: RE | Admit: 2016-11-18 | Discharge: 2016-11-18 | Disposition: A | Discharge status: Home / Self Care | Payer: BLUE CROSS/BLUE SHIELD | Source: Ambulatory Visit | Attending: Obstetrics | Admitting: Obstetrics

## 2016-11-18 DIAGNOSIS — M85851 Other specified disorders of bone density and structure, right thigh: Secondary | ICD-10-CM | POA: Diagnosis not present

## 2016-11-18 DIAGNOSIS — Z780 Asymptomatic menopausal state: Secondary | ICD-10-CM

## 2016-11-25 ENCOUNTER — Encounter: Payer: Self-pay | Admitting: Gastroenterology

## 2016-11-25 ENCOUNTER — Ambulatory Visit (INDEPENDENT_AMBULATORY_CARE_PROVIDER_SITE_OTHER): Payer: BLUE CROSS/BLUE SHIELD | Admitting: Gastroenterology

## 2016-11-25 VITALS — BP 98/64 | HR 76 | Ht 67.0 in | Wt 169.8 lb

## 2016-11-25 DIAGNOSIS — R140 Abdominal distension (gaseous): Secondary | ICD-10-CM | POA: Diagnosis not present

## 2016-11-25 DIAGNOSIS — K581 Irritable bowel syndrome with constipation: Secondary | ICD-10-CM

## 2016-11-25 NOTE — Patient Instructions (Addendum)
Food Guidelines for bloating and gas;  Many people have difficulty digesting certain foods, causing a variety of distressing and embarrassing symptoms such as abdominal pain, bloating and gas.  These foods may need to be avoided or consumed in small amounts.  Here are some tips that might be helpful for you.  1.   Lactose intolerance is the difficulty or complete inability to digest lactose, the natural sugar in milk and anything made from milk.  This condition is harmless, common, and can begin any time during life.  Some people can digest a modest amount of lactose while others cannot tolerate any.  Also, not all dairy products contain equal amounts of lactose.  For example, hard cheeses such as parmesan have less lactose than soft cheeses such as cheddar.  Yogurt has less lactose than milk or cheese.  Many packaged foods (even many brands of bread) have milk, so read ingredient lists carefully.  It is difficult to test for lactose intolerance, so just try avoiding lactose as much as possible for a week and see what happens with your symptoms.  If you seem to be lactose intolerant, the best plan is to avoid it (but make sure you get calcium from another source).  The next best thing is to use lactase enzyme supplements, available over the counter everywhere.  Just know that many lactose intolerant people need to take several tablets with each serving of dairy to avoid symptoms.  Lastly, a lot of restaurant food is made with milk or butter.  Many are things you might not suspect, such as mashed potatoes, rice and pasta (cooked with butter) and "grilled" items.  If you are lactose intolerant, it never hurts to ask your server what has milk or butter.  2.   Fiber is an important part of your diet, but not all fiber is well-tolerated.  Insoluble fiber such as bran is often consumed by normal gut bacteria and converted into gas.  Soluble fiber such as oats, squash, carrots and green beans are typically tolerated  better.  3.   Some types of carbohydrates can be poorly digested.  Examples include: fructose (apples, cherries, pears, raisins and other dried fruits), fructans (onions, zucchini, large amounts of wheat), sorbitol/mannitol/xylitol and sucralose/Splenda (common artificial sweeteners), and raffinose (lentils, broccoli, cabbage, asparagus, brussel sprouts, many types of beans).  Do a Development worker, community for [employer] and you will find helpful information. Beano, a dietary supplement, will often help with raffinose-containing foods.  As with lactase tablets, you may need several per serving.  4.   Whenever possible, avoid processed food&meats and chemical additives.  High fructose corn syrup, a common sweetener, may be difficult to digest.  Eggs and soy (comes from the soybean, and added to many foods now) are the other most common bloating/gassy foods.  - Dr. [facility]     ______________________________________________________________________________________   It has been recommended to you by your physician that you have a(n) Colonoscopy completed. Per your request, we did not schedule the procedure(s) today. Please contact our office at [phone] should you decide to have the procedure completed.  If you are age 79 or older, your body mass index should be between 23-30. Your Body mass index is 26.59 kg/m. If this is out of the aforementioned range listed, please consider follow up with your Primary Care Provider.  If you are age 73 or younger, your body mass index should be between 19-25. Your Body mass index is 26.59 kg/m. If this  is out of the aformentioned range listed, please consider follow up with your Primary Care Provider.   Thank you for choosing [facility]  Dr Wilfrid Lund III

## 2016-11-25 NOTE — Progress Notes (Signed)
[facility] Consult Note:  History: Erica [name] 11/25/2016  Referring physician: Baltazar Najjar, MD [facility])  Reason for consult/chief complaint: Irritable Bowel Syndrome; Bloated (pt reports bloating is all the time and getting worse; pressing on "pelvic area"); Gas; Gastroesophageal Reflux (takes omeprazole ); and Constipation   Subjective  HPI:  This is a 51 year old woman referred by her gynecologist noted above for a reevaluation of bloating and constipation. She says it is been going on since age 26, and behaved very similarly throughout all that time. She saw Dr. [name] as far back as 2006, and that was the last time she had a colonoscopy. An office visit with Dr. [name] in 2013 resulted in a trial of Amitiza that did not help relieve constipation, and MiraLAX had not been helpful either, and Levsin did not help the bloating. Her last clinic visit in July 2015 with our PA resulted in testing for celiac sprue which was normal. Debroah Baller says that her bowel habits really were fairly regular until she was recently started on some vitamin D that apparently worsened her constipation. She is now down to taking the vitamin D just once weekly. She still has chronic and fairly constant bloating and thinks that she is distended and looks pregnant. He has never been able to find any thing that relieves this bloating and distention. She may feel fine when she first awakens, but as soon as she eats or drinks anything she develops bloating and distention. Last year she was seen in the emergency department for chest pain that she was told was likely GERD, and that is when she was put on once daily omeprazole.  ROS:  Review of Systems  Constitutional: Negative for appetite change and unexpected weight change.  HENT: Negative for mouth sores and voice change.   Eyes: Negative for pain and redness.  Respiratory: Negative for cough and shortness of breath.   Cardiovascular:  Negative for chest pain and palpitations.  Genitourinary: Negative for dysuria and hematuria.  Musculoskeletal: Negative for arthralgias and myalgias.  Skin: Negative for pallor and rash.  Neurological: Negative for weakness and headaches.  Hematological: Negative for adenopathy.     Past Medical History: Past Medical History:  Diagnosis Date  . Essential hypertension, benign   . Fibroids   . GERD (gastroesophageal reflux disease)   . HH (hiatus hernia)   . Hx of adenomatous colonic polyps   . IBS (irritable bowel syndrome)   . Osteopenia 10/2016     Past Surgical History: Past Surgical History:  Procedure Laterality Date  . COLONOSCOPY  01/01/2005   Normal   . UTERINE FIBROID EMBOLIZATION    . uterine polypectomy       Family History: Family History  Problem Relation Age of Onset  . Hodgkin's lymphoma Mother   . Alzheimer's disease Father   . Colon cancer Maternal Grandfather   . Stroke Paternal Grandfather   . Breast cancer Sister   . Breast cancer Maternal Aunt     Social History: Social History   Social History  . Marital status: Single    Spouse name: N/A  . Number of children: 0  . Years of education: N/A   Occupational History  . MI service specialist [employer]   Social History Main Topics  . Smoking status: Never Smoker  . Smokeless tobacco: Never Used  . Alcohol use No  . Drug use: No  . Sexual activity: No   Other Topics Concern  . None  Social History Narrative  . None    Allergies: No Known Allergies  Outpatient Meds: Current Outpatient Prescriptions  Medication Sig Dispense Refill  . cyclobenzaprine (FLEXERIL) 10 MG tablet Take 1 tablet (10 mg total) by mouth every 8 (eight) hours. 30 tablet 2  . docusate sodium (COLACE) 100 MG capsule Take 1 capsule (100 mg total) by mouth 2 (two) times daily. 60 capsule 11  . FeFum-FePoly-FA-B Cmp-C-Biot (INTEGRA PLUS PO) Take by mouth every morning.    . hydrochlorothiazide  (HYDRODIURIL) 25 MG tablet Take 25 mg by mouth every evening.     Marland Kitchen ibuprofen (ADVIL,MOTRIN) 800 MG tablet Take 1 tablet (800 mg total) by mouth every 8 (eight) hours. 30 tablet 5  . lisinopril (PRINIVIL,ZESTRIL) 10 MG tablet Take 10 mg by mouth daily.    . meloxicam (MOBIC) 7.5 MG tablet Take 7.5 mg by mouth daily.    . metroNIDAZOLE (METROGEL VAGINAL) 0.75 % vaginal gel Place 1 Applicatorful vaginally 2 (two) times daily. 70 g 2  . Multiple Vitamin (MULTIVITAMIN WITH MINERALS) TABS Take 1 tablet by mouth daily. Reported on 07/10/2015    . omeprazole (PRILOSEC) 20 MG capsule Take 20 mg by mouth daily.    . polyvinyl alcohol (LIQUIFILM TEARS) 1.4 % ophthalmic solution Place 1 drop into both eyes daily as needed (dry eyes). Reported on 07/10/2015    . Vitamin D, Ergocalciferol, (DRISDOL) 50000 units CAPS capsule Take 50,000 Units by mouth every 7 (seven) days.     No current facility-administered medications for this visit.       ___________________________________________________________________ Objective   Exam:  BP 98/64   Pulse 76   Ht 5\' 7"  (1.702 m)   Wt 169 lb 12.8 oz (77 kg)   LMP 11/02/2012 (Approximate) Comment: 2014  BMI 26.59 kg/m    General: this is a(n) Well-appearing woman   Eyes: sclera anicteric, no redness  ENT: oral mucosa moist without lesions, no cervical or supraclavicular lymphadenopathy, good dentition  CV: RRR without murmur, S1/S2, no JVD, no peripheral edema  Resp: clear to auscultation bilaterally, normal RR and effort noted  GI: soft, mild lower tenderness, with active bowel sounds. No guarding or palpable organomegaly noted. She does not appear distended, and is not tympanitic. She has normal active bowel sounds.  Skin; warm and dry, no rash or jaundice noted  Neuro: awake, alert and oriented x 3. Normal gross motor function and fluent speech  No data  Assessment: Encounter Diagnoses  Name Primary?  . Irritable bowel syndrome with  constipation Yes  . Abdominal bloating     This patient has long-standing constipation and bloating that appear to be functional. Perhaps at some point she could have also developed pelvic floor dysfunction or dyssynergia defecation, but I think the overall symptom complex favors a functional disorder. We discussed the unclear nature of this condition and the limited available treatments, especially for the bloating. Her symptoms do not sound obstructive, and I tried to reassure her.  Plan:  Written dietary advice given Samples of IB gard  She is in need of a routine screening colonoscopy, but does not yet feel ready to do that. She was encouraged to contact us when she is ready.  Thank you for the courtesy of this consult.  Please call me with any questions or concerns.  Nelida Meuse III  CC: Baltazar Najjar, MD

## 2016-11-26 ENCOUNTER — Ambulatory Visit (INDEPENDENT_AMBULATORY_CARE_PROVIDER_SITE_OTHER): Payer: BLUE CROSS/BLUE SHIELD | Admitting: Family Medicine

## 2016-11-26 ENCOUNTER — Encounter: Payer: Self-pay | Admitting: Family Medicine

## 2016-11-26 VITALS — BP 118/74 | HR 67 | Temp 97.9°F | Ht 67.0 in | Wt 170.4 lb

## 2016-11-26 DIAGNOSIS — E559 Vitamin D deficiency, unspecified: Secondary | ICD-10-CM | POA: Diagnosis not present

## 2016-11-26 DIAGNOSIS — K219 Gastro-esophageal reflux disease without esophagitis: Secondary | ICD-10-CM | POA: Diagnosis not present

## 2016-11-26 DIAGNOSIS — M8580 Other specified disorders of bone density and structure, unspecified site: Secondary | ICD-10-CM | POA: Diagnosis not present

## 2016-11-26 DIAGNOSIS — I10 Essential (primary) hypertension: Secondary | ICD-10-CM | POA: Diagnosis not present

## 2016-11-26 HISTORY — DX: Vitamin D deficiency, unspecified: E55.9

## 2016-11-26 NOTE — Progress Notes (Signed)
Erica [name] is a 51 y.o. female is here to [place].   Patient Care Team: Briscoe Deutscher, DO as PCP - General (Family Medicine)   History of Present Illness:   Erica [name], CMA, acting as scribe for Dr. Juleen China.  HPI:  Patient comes in today to establish care.  States she recently had a bone density scan and is concerned about the results.  She had the scan because she was having some weakness in her legs when standing.  States she was told she was told she has osteopenia.  She also was concerned because she was told her vitamin D level was 17.  She has been taking high dose vitamin D for some time and states she has been drinking milk.  States she began taking high dose vitamin D about a month ago her level was rechecked and was 30.  States that osteoporosis doesn't run in her family.  States she was participating in a weight training class but pulled a muscle in her back so she stopped going.  She was given ibuprofen and a muscle relaxer.  She states her back has healed so she intends to begin going back to the gym.  States she tries to eat healthy.  She does not eat beef or pork.  She was having some numbness and tingling in her fingers but she is taking a B12 supplement.  She says the numbness and tingling has resolved since taking B12.    The thumb nail on her right thumb turned black.  And no one has been able to determine the cause.  She has been to the dermatologist.  She was having stiffness in her hands as well but states this has gotten better.  She has a prescription for meloxicam but hasn't been taking this.  Concern would be for malabsorption in a patient with IBS and diet restrictions.  Will get records from derm.  Health Maintenance Due  Topic Date Due  . TETANUS/TDAP  08/15/1983  . INFLUENZA VACCINE  11/10/2016   Depression screen PHQ 2/9 11/26/2016  Decreased Interest 0  Down, Depressed, Hopeless 0  PHQ - 2 Score 0   PMHx, SurgHx, SocialHx, Medications, and  Allergies were reviewed in the Visit Navigator and updated as appropriate.   Past Medical History:  Diagnosis Date  . Anemia   . Depression   . Essential hypertension, benign   . Fibroids   . GERD (gastroesophageal reflux disease)   . HH (hiatus hernia)   . Hx of adenomatous colonic polyps   . IBS (irritable bowel syndrome)   . Osteopenia 10/2016  . SUI (stress urinary incontinence, female) 09/29/2016   Past Surgical History:  Procedure Laterality Date  . COLONOSCOPY  01/01/2005  . UTERINE FIBROID EMBOLIZATION    . UTERINE POLYPECTOMY     Family History  Problem Relation Age of Onset  . Hodgkin's lymphoma Mother   . Alzheimer's disease Father   . Colon cancer Maternal Grandfather   . Stroke Paternal Grandfather   . Breast cancer Sister   . Breast cancer Maternal Aunt    Social History  Substance Use Topics  . Smoking status: Never Smoker  . Smokeless tobacco: Never Used  . Alcohol use No   Current Medications and Allergies:   .  docusate sodium (COLACE) 100 MG capsule, Take 1 capsule (100 mg total) by mouth 2 (two) times daily., Disp: 60 capsule, Rfl: 11 .  FeFum-FePoly-FA-B Cmp-C-Biot (INTEGRA PLUS PO), Take by mouth every  morning., Disp: , Rfl:  .  hydrochlorothiazide (HYDRODIURIL) 25 MG tablet, Take 25 mg by mouth every evening. , Disp: , Rfl:  .  lisinopril (PRINIVIL,ZESTRIL) 10 MG tablet, Take 10 mg by mouth daily., Disp: , Rfl:  .  Multiple Vitamin (MULTIVITAMIN WITH MINERALS) TABS, Take 1 tablet by mouth daily. Reported on 07/10/2015, Disp: , Rfl:  .  omeprazole (PRILOSEC) 20 MG capsule, Take 20 mg by mouth daily., Disp: , Rfl:  .  polyvinyl alcohol (LIQUIFILM TEARS) 1.4 % ophthalmic solution, Place 1 drop into both eyes daily as needed (dry eyes) .  vitamin B-12 (CYANOCOBALAMIN) 1000 MCG tablet, Take 1,000 mcg by mouth daily., Disp: , Rfl:   No Known Allergies   Review of Systems:   Pertinent items are noted in the HPI. Otherwise, ROS is negative.  Vitals:     Vitals:   11/26/16 1120  BP: 118/74  Pulse: 67  Temp: 97.9 F (36.6 C)  TempSrc: Oral  SpO2: 99%  Weight: 170 lb 6.4 oz (77.3 kg)  Height: 5\' 7"  (1.702 m)     Body mass index is 26.69 kg/m.   Physical Exam:   Physical Exam  Constitutional: She is oriented to person, place, and time. She appears well-developed and well-nourished. No distress.  HENT:  Head: Normocephalic and atraumatic.  Right Ear: External ear normal.  Left Ear: External ear normal.  Nose: Nose normal.  Mouth/Throat: Oropharynx is clear and moist.  Eyes: Pupils are equal, round, and reactive to light. Conjunctivae and EOM are normal.  Neck: Normal range of motion. Neck supple.  Cardiovascular: Normal rate, regular rhythm, normal heart sounds and intact distal pulses.   Pulmonary/Chest: Effort normal and breath sounds normal.  Abdominal: Soft. Bowel sounds are normal.  Musculoskeletal: Normal range of motion.  Neurological: She is alert and oriented to person, place, and time.  Skin: Skin is warm.  Psychiatric: She has a normal mood and affect. Her behavior is normal.  Nursing note and vitals reviewed.  Assessment and Plan:   Erica [name] was seen today for establish care.  Diagnoses and all orders for this visit:  Essential hypertension Comments: Controlled on current regimen.  Osteopenia, unspecified location Comments: NEW diagnosis. Spent time discussing diagnosis and prevention of progression to osteoporosis.  Vitamin D deficiency Comments: Now s/p supplementation to 30 range per patient. Will request records.  Gastroesophageal reflux disease without esophagitis Comments: Controlled on current regimen.   . Reviewed expectations re: course of current medical issues. . Discussed self-management of symptoms. . Outlined signs and symptoms indicating need for more acute intervention. . Patient verbalized understanding and all questions were answered. Marland Kitchen Health Maintenance issues including  appropriate healthy diet, exercise, and smoking avoidance were discussed with patient. . See orders for this visit as documented in the electronic medical record. . Patient received an After Visit Summary.  CMA served as Education administrator during this visit. History, Physical, and Plan performed by medical provider. The above documentation has been reviewed and is accurate and complete. Briscoe Deutscher, D.O.  Briscoe Deutscher, DO [place], [facility] 11/26/2016

## 2016-12-31 ENCOUNTER — Other Ambulatory Visit: Payer: Self-pay | Admitting: Family Medicine

## 2016-12-31 MED ORDER — LISINOPRIL 10 MG PO TABS: 10.0000 mg | ORAL_TABLET | Freq: Every day | ORAL | 1 refills | Status: DC

## 2016-12-31 NOTE — Telephone Encounter (Signed)
MEDICATION:   lisinopril (PRINIVIL,ZESTRIL) 10 MG tablet     PHARMACY:   [id number] - [place], [place] - [address] [phone] (Phone) [phone] (Fax)   IS THIS A 90 DAY SUPPLY : yes  IS PATIENT OUT OF MEDICATION: yes  IF NOT; HOW MUCH IS LEFT: n/a  LAST APPOINTMENT DATE: @8 /17/2018  NEXT APPOINTMENT DATE:@Visit  date not found  OTHER COMMENTS:    **Let patient know to contact pharmacy at the end of the day to make sure medication is ready. **  ** Please notify patient to allow 48-72 hours to process**  **Encourage patient to contact the pharmacy for refills or they can request refills through [facility]**

## 2016-12-31 NOTE — Telephone Encounter (Signed)
I have refilled patient prescription per Dr. Alcario Drought note that patient is controlled under current regimen.

## 2017-03-29 ENCOUNTER — Other Ambulatory Visit: Payer: Self-pay | Admitting: Family Medicine

## 2017-03-29 ENCOUNTER — Ambulatory Visit: Payer: BLUE CROSS/BLUE SHIELD | Admitting: Obstetrics

## 2017-03-29 ENCOUNTER — Encounter: Payer: Self-pay | Admitting: Obstetrics

## 2017-03-29 ENCOUNTER — Ambulatory Visit (INDEPENDENT_AMBULATORY_CARE_PROVIDER_SITE_OTHER): Payer: BLUE CROSS/BLUE SHIELD | Admitting: Obstetrics

## 2017-03-29 ENCOUNTER — Other Ambulatory Visit: Payer: Self-pay

## 2017-03-29 VITALS — BP 118/78 | HR 78 | Ht 67.0 in | Wt 173.9 lb

## 2017-03-29 DIAGNOSIS — B9689 Other specified bacterial agents as the cause of diseases classified elsewhere: Secondary | ICD-10-CM

## 2017-03-29 DIAGNOSIS — N898 Other specified noninflammatory disorders of vagina: Secondary | ICD-10-CM

## 2017-03-29 DIAGNOSIS — N760 Acute vaginitis: Secondary | ICD-10-CM

## 2017-03-29 DIAGNOSIS — Z113 Encounter for screening for infections with a predominantly sexual mode of transmission: Secondary | ICD-10-CM | POA: Diagnosis not present

## 2017-03-29 MED ORDER — METRONIDAZOLE 0.75 % VA GEL: VAGINAL | 99 refills | Status: DC

## 2017-03-29 NOTE — Telephone Encounter (Signed)
Please advise on refill. Historical provider.  

## 2017-03-29 NOTE — Progress Notes (Signed)
Presents for STD Testing. Complains of skin on labia peeling when she wipes, brown discharge, odor and cramping 4/10 x 2-3 days. Denies NV and itching.

## 2017-03-30 ENCOUNTER — Encounter: Payer: Self-pay | Admitting: Obstetrics

## 2017-03-30 LAB — CERVICOVAGINAL ANCILLARY ONLY
Bacterial vaginitis: NEGATIVE
Candida vaginitis: NEGATIVE
Chlamydia: NEGATIVE
Neisseria Gonorrhea: NEGATIVE
Trichomonas: NEGATIVE

## 2017-03-30 NOTE — Progress Notes (Signed)
Patient ID: Erica [name], female   DOB: [birth year], 51 y.o.   MRN: [id number]  No chief complaint on file.   HPI Erica [name] is a 51 y.o. female.  Chronic BV infections. HPI  Past Medical History:  Diagnosis Date  . Anemia   . Depression   . Essential hypertension, benign   . Fibroids   . GERD (gastroesophageal reflux disease)   . HH (hiatus hernia)   . Hx of adenomatous colonic polyps   . IBS (irritable bowel syndrome)   . Osteopenia 10/2016  . SUI (stress urinary incontinence, female) 09/29/2016    Past Surgical History:  Procedure Laterality Date  . COLONOSCOPY  01/01/2005   Normal   . UTERINE FIBROID EMBOLIZATION    . uterine polypectomy      Family History  Problem Relation Age of Onset  . Hodgkin's lymphoma Mother   . Alzheimer's disease Father   . Colon cancer Maternal Grandfather   . Stroke Paternal Grandfather   . Breast cancer Sister   . Breast cancer Maternal Aunt     Social History Social History   Tobacco Use  . Smoking status: Never Smoker  . Smokeless tobacco: Never Used  Substance Use Topics  . Alcohol use: No    Alcohol/week: 0.0 oz  . Drug use: No    No Known Allergies  Current Outpatient Medications  Medication Sig Dispense Refill  . cyclobenzaprine (FLEXERIL) 10 MG tablet Take 1 tablet (10 mg total) by mouth every 8 (eight) hours. 30 tablet 2  . docusate sodium (COLACE) 100 MG capsule Take 1 capsule (100 mg total) by mouth 2 (two) times daily. 60 capsule 11  . FeFum-FePoly-FA-B Cmp-C-Biot (INTEGRA PLUS PO) Take by mouth every morning.    Marland Kitchen FeFum-FePoly-FA-B Cmp-C-Biot (INTEGRA PLUS) CAPS TAKE ONE CAPSULE EVERY DAY 90 capsule 1  . hydrochlorothiazide (HYDRODIURIL) 25 MG tablet Take 25 mg by mouth every evening.     Marland Kitchen ibuprofen (ADVIL,MOTRIN) 800 MG tablet Take 1 tablet (800 mg total) by mouth every 8 (eight) hours. 30 tablet 5  . lisinopril (PRINIVIL,ZESTRIL) 10 MG tablet Take 1 tablet (10 mg total) by mouth daily. 90 tablet 1  .  meloxicam (MOBIC) 7.5 MG tablet Take 7.5 mg by mouth daily.    . metroNIDAZOLE (METROGEL VAGINAL) 0.75 % vaginal gel One applicator intravaginally bid twice a week for 6 months. 70 g prn  . Multiple Vitamin (MULTIVITAMIN WITH MINERALS) TABS Take 1 tablet by mouth daily. Reported on 07/10/2015    . omeprazole (PRILOSEC) 20 MG capsule Take 20 mg by mouth daily.    . polyvinyl alcohol (LIQUIFILM TEARS) 1.4 % ophthalmic solution Place 1 drop into both eyes daily as needed (dry eyes). Reported on 07/10/2015    . vitamin B-12 (CYANOCOBALAMIN) 1000 MCG tablet Take 1,000 mcg by mouth daily.    . Vitamin D, Ergocalciferol, (DRISDOL) 50000 units CAPS capsule Take 50,000 Units by mouth every 7 (seven) days.     No current facility-administered medications for this visit.     Review of Systems Review of Systems Constitutional: negative for fatigue and weight loss Respiratory: negative for cough and wheezing Cardiovascular: negative for chest pain, fatigue and palpitations Gastrointestinal: negative for abdominal pain and change in bowel habits Genitourinary:positive for malodorous vaginal discharge Integument/breast: negative for nipple discharge Musculoskeletal:negative for myalgias Neurological: negative for gait problems and tremors Behavioral/Psych: negative for abusive relationship, depression Endocrine: negative for temperature intolerance      Blood pressure 118/78, pulse 78,  height 5\' 7"  (1.702 m), weight 173 lb 14.4 oz (78.9 kg), last menstrual period 11/02/2012.  Physical Exam Physical Exam           General:  Alert and no distress Abdomen:  normal findings: no organomegaly, soft, non-tender and no hernia  Pelvis:  External genitalia: normal general appearance Urinary system: urethral meatus normal and bladder without fullness, nontender Vaginal: normal without tenderness, induration or masses Cervix: normal appearance Adnexa: normal bimanual exam Uterus: anteverted and non-tender,  normal size    50% of 15 min visit spent on counseling and coordination of care.   Data Reviewed Wet Prep  Assessment     1. Vaginal irritation Rx: - Cervicovaginal ancillary only   2. BV (bacterial vaginosis) - Recurrent Rx: - Cervicovaginal ancillary only - metroNIDAZOLE (METROGEL VAGINAL) 0.75 % vaginal gel; One applicator intravaginally bid twice a week for 6 months.  Dispense: 70 g; Refill: prn    Plan    Follow up in 6 months  No orders of the defined types were placed in this encounter.  Meds ordered this encounter  Medications  . metroNIDAZOLE (METROGEL VAGINAL) 0.75 % vaginal gel    Sig: One applicator intravaginally bid twice a week for 6 months.    Dispense:  70 g    Refill:  prn

## 2017-04-29 ENCOUNTER — Telehealth: Payer: Self-pay

## 2017-04-29 DIAGNOSIS — Z13220 Encounter for screening for lipoid disorders: Secondary | ICD-10-CM

## 2017-04-29 DIAGNOSIS — E559 Vitamin D deficiency, unspecified: Secondary | ICD-10-CM

## 2017-04-29 DIAGNOSIS — I10 Essential (primary) hypertension: Secondary | ICD-10-CM

## 2017-04-29 NOTE — Telephone Encounter (Signed)
Please advise on what labs you want patient to have.

## 2017-04-29 NOTE — Telephone Encounter (Signed)
Pt coming for labs 05/02/17. Please place future orders. Thank you.

## 2017-04-30 NOTE — Telephone Encounter (Signed)
CBC with diff, CMP, FLP, vit D.

## 2017-05-02 ENCOUNTER — Other Ambulatory Visit (INDEPENDENT_AMBULATORY_CARE_PROVIDER_SITE_OTHER): Payer: BLUE CROSS/BLUE SHIELD

## 2017-05-02 DIAGNOSIS — Z13220 Encounter for screening for lipoid disorders: Secondary | ICD-10-CM | POA: Diagnosis not present

## 2017-05-02 DIAGNOSIS — E559 Vitamin D deficiency, unspecified: Secondary | ICD-10-CM | POA: Diagnosis not present

## 2017-05-02 DIAGNOSIS — I10 Essential (primary) hypertension: Secondary | ICD-10-CM | POA: Diagnosis not present

## 2017-05-02 LAB — CBC WITH DIFFERENTIAL/PLATELET
Basophils Absolute: 0 10*3/uL (ref 0.0–0.1)
Basophils Relative: 0.5 % (ref 0.0–3.0)
Eosinophils Absolute: 0.1 10*3/uL (ref 0.0–0.7)
Eosinophils Relative: 1.1 % (ref 0.0–5.0)
HCT: 40.2 % (ref 36.0–46.0)
Hemoglobin: 13.3 g/dL (ref 12.0–15.0)
Lymphocytes Relative: 44.3 % (ref 12.0–46.0)
Lymphs Abs: 2 10*3/uL (ref 0.7–4.0)
MCHC: 33.2 g/dL (ref 30.0–36.0)
MCV: 89.6 fl (ref 78.0–100.0)
Monocytes Absolute: 0.4 10*3/uL (ref 0.1–1.0)
Monocytes Relative: 8.5 % (ref 3.0–12.0)
Neutro Abs: 2.1 10*3/uL (ref 1.4–7.7)
Neutrophils Relative %: 45.6 % (ref 43.0–77.0)
Platelets: 180 10*3/uL (ref 150.0–400.0)
RBC: 4.48 Mil/uL (ref 3.87–5.11)
RDW: 13.5 % (ref 11.5–15.5)
WBC: 4.6 10*3/uL (ref 4.0–10.5)

## 2017-05-02 LAB — LIPID PANEL
Cholesterol: 181 mg/dL (ref 0–200)
HDL: 64.7 mg/dL (ref >39.00)
LDL Cholesterol: 105 mg/dL — ABNORMAL HIGH (ref 0–99)
NonHDL: 116.08
Total CHOL/HDL Ratio: 3
Triglycerides: 56 mg/dL (ref 0.0–149.0)
VLDL: 11.2 mg/dL (ref 0.0–40.0)

## 2017-05-02 LAB — COMPREHENSIVE METABOLIC PANEL
ALT: 16 U/L (ref 0–35)
AST: 22 U/L (ref 0–37)
Albumin: 4.2 g/dL (ref 3.5–5.2)
Alkaline Phosphatase: 73 U/L (ref 39–117)
BUN: 18 mg/dL (ref 6–23)
CO2: 33 mEq/L — ABNORMAL HIGH (ref 19–32)
Calcium: 10 mg/dL (ref 8.4–10.5)
Chloride: 101 mEq/L (ref 96–112)
Creatinine, Ser: 0.93 mg/dL (ref 0.40–1.20)
GFR: 81.19 mL/min (ref >60.00)
Glucose, Bld: 87 mg/dL (ref 70–99)
Potassium: 4.4 mEq/L (ref 3.5–5.1)
Sodium: 139 mEq/L (ref 135–145)
Total Bilirubin: 0.6 mg/dL (ref 0.2–1.2)
Total Protein: 7 g/dL (ref 6.0–8.3)

## 2017-05-02 LAB — VITAMIN D 25 HYDROXY (VIT D DEFICIENCY, FRACTURES): VITD: 25.83 ng/mL — ABNORMAL LOW (ref 30.00–100.00)

## 2017-05-02 NOTE — Telephone Encounter (Signed)
Orders placed.

## 2017-05-03 MED ORDER — CHOLECALCIFEROL 1.25 MG (50000 UT) PO TABS: ORAL_TABLET | ORAL | 0 refills | Status: DC

## 2017-05-03 NOTE — Addendum Note (Signed)
Addended by: Briscoe Deutscher R on: 05/03/2017 08:24 PM   Modules accepted: Orders

## 2017-05-10 ENCOUNTER — Ambulatory Visit (INDEPENDENT_AMBULATORY_CARE_PROVIDER_SITE_OTHER): Payer: BLUE CROSS/BLUE SHIELD | Admitting: Family Medicine

## 2017-05-10 ENCOUNTER — Encounter: Payer: Self-pay | Admitting: Family Medicine

## 2017-05-10 VITALS — BP 126/84 | HR 68 | Temp 98.2°F | Ht 67.0 in | Wt 178.0 lb

## 2017-05-10 DIAGNOSIS — I10 Essential (primary) hypertension: Secondary | ICD-10-CM

## 2017-05-10 DIAGNOSIS — Z0000 Encounter for general adult medical examination without abnormal findings: Secondary | ICD-10-CM | POA: Diagnosis not present

## 2017-05-10 DIAGNOSIS — E559 Vitamin D deficiency, unspecified: Secondary | ICD-10-CM | POA: Diagnosis not present

## 2017-05-10 MED ORDER — OMEPRAZOLE 20 MG PO CPDR: 20.0000 mg | DELAYED_RELEASE_CAPSULE | Freq: Every day | ORAL | 11 refills | Status: DC

## 2017-05-10 NOTE — Patient Instructions (Signed)
It was nice to see you today!  Your labs look great! The vitamin D is already at your pharmacy.  After taking the high dose supplement, take vitamin D 1000 IU daily.   MANAGEMENT OF CHRONIC CONSTIPATION   Push fluids, all day, everyday  Eat lots of high fiber foods-fruits, veggies, bran and whole grain instead of white bread  Be active everyday. Inactivity makes constipation worse.  Add psyllium daily (Metamucil) which comes in capsules now. Start very low dose and work up to recommended dose on bottle daily.  Stay away from [place] or any magnesium containing laxative, unless you need it to clear things out rarely. It is an addictive laxative and your gut will become dependent on it.  If that is not working, I would start Miralax, which you can buy in generic 17 gms daily. It's a powder and not an "addictive laxative". Take it every day and titrate the dose up or down to get the daily BM.

## 2017-05-10 NOTE — Progress Notes (Signed)
Subjective:    PAISELY BRICK is a 52 y.o. female and is here for a comprehensive physical exam.  Pertinent Gynecological History: Patient's last menstrual period was 11/02/2012 (approximate).  Health Maintenance Due  Topic Date Due  . TETANUS/TDAP  08/15/1983  . INFLUENZA VACCINE  11/10/2016   PMHx, SurgHx, SocialHx, Medications, and Allergies were reviewed in the Visit Navigator and updated as appropriate.   Past Medical History:  Diagnosis Date  . Anemia   . Depression   . Essential hypertension, benign   . Fibroids   . GERD (gastroesophageal reflux disease)   . HH (hiatus hernia)   . Hx of adenomatous colonic polyps   . IBS (irritable bowel syndrome)   . Osteopenia 10/2016  . SUI (stress urinary incontinence, female) 09/29/2016   Past Surgical History:  Procedure Laterality Date  . COLONOSCOPY  01/01/2005   Normal   . POLYPECTOMY    . UTERINE FIBROID EMBOLIZATION     Family History  Problem Relation Age of Onset  . Hodgkin's lymphoma Mother   . Alzheimer's disease Father   . Colon cancer Maternal Grandfather   . Stroke Paternal Grandfather   . Breast cancer Sister   . Breast cancer Maternal Aunt    Social History   Tobacco Use  . Smoking status: Never Smoker  . Smokeless tobacco: Never Used  Substance Use Topics  . Alcohol use: No    Alcohol/week: 0.0 oz  . Drug use: No    Review of Systems:   Pertinent items are noted in the HPI. Otherwise, ROS is negative.  Objective:   BP 126/84   Pulse 68   Temp 98.2 F (36.8 C) (Oral)   Ht 5\' 7"  (1.702 m)   Wt 178 lb (80.7 kg)   LMP 11/02/2012 (Approximate) Comment: 2014  SpO2 100%   BMI 27.88 kg/m    Wt Readings from Last 3 Encounters:  05/10/17 178 lb (80.7 kg)  03/29/17 173 lb 14.4 oz (78.9 kg)  11/26/16 170 lb 6.4 oz (77.3 kg)     Ht Readings from Last 3 Encounters:  05/10/17 5\' 7"  (1.702 m)  03/29/17 5\' 7"  (1.702 m)  11/26/16 5\' 7"  (1.702 m)   General appearance: alert, cooperative and  appears stated age. Head: normocephalic, without obvious abnormality, atraumatic. Neck: no adenopathy, supple, symmetrical, trachea midline; thyroid not enlarged, symmetric, no tenderness/mass/nodules. Lungs: clear to auscultation bilaterally. Heart: regular rate and rhythm Abdomen: soft, non-tender; no masses,  no organomegaly. Extremities: extremities normal, atraumatic, no cyanosis or edema. Skin: skin color, texture, turgor normal, no rashes or lesions. Lymph: cervical, supraclavicular, and axillary nodes normal; no abnormal inguinal nodes palpated. Neurologic: grossly normal.                           Results for orders placed or performed in visit on 05/02/17  VITAMIN D 25 Hydroxy (Vit-D Deficiency, Fractures)  Result Value Ref Range   VITD 25.83 (L) 30.00 - 100.00 ng/mL  Lipid panel  Result Value Ref Range   Cholesterol 181 0 - 200 mg/dL   Triglycerides 56.0 0.0 - 149.0 mg/dL   HDL 64.70 >39.00 mg/dL   VLDL 11.2 0.0 - 40.0 mg/dL   LDL Cholesterol 105 (H) 0 - 99 mg/dL   Total CHOL/HDL Ratio 3    NonHDL 116.08   Comprehensive metabolic panel  Result Value Ref Range   Sodium 139 135 - 145 mEq/L   Potassium 4.4 3.5 -  5.1 mEq/L   Chloride 101 96 - 112 mEq/L   CO2 33 (H) 19 - 32 mEq/L   Glucose, Bld 87 70 - 99 mg/dL   BUN 18 6 - 23 mg/dL   Creatinine, Ser 0.93 0.40 - 1.20 mg/dL   Total Bilirubin 0.6 0.2 - 1.2 mg/dL   Alkaline Phosphatase 73 39 - 117 U/L   AST 22 0 - 37 U/L   ALT 16 0 - 35 U/L   Total Protein 7.0 6.0 - 8.3 g/dL   Albumin 4.2 3.5 - 5.2 g/dL   Calcium 10.0 8.4 - 10.5 mg/dL   GFR 81.19 >60.00 mL/min  CBC with Differential/Platelet  Result Value Ref Range   WBC 4.6 4.0 - 10.5 K/uL   RBC 4.48 3.87 - 5.11 Mil/uL   Hemoglobin 13.3 12.0 - 15.0 g/dL   HCT 40.2 36.0 - 46.0 %   MCV 89.6 78.0 - 100.0 fl   MCHC 33.2 30.0 - 36.0 g/dL   RDW 13.5 11.5 - 15.5 %   Platelets 180.0 150.0 - 400.0 K/uL   Neutrophils Relative % 45.6 43.0 - 77.0 %   Lymphocytes Relative  44.3 12.0 - 46.0 %   Monocytes Relative 8.5 3.0 - 12.0 %   Eosinophils Relative 1.1 0.0 - 5.0 %   Basophils Relative 0.5 0.0 - 3.0 %   Neutro Abs 2.1 1.4 - 7.7 K/uL   Lymphs Abs 2.0 0.7 - 4.0 K/uL   Monocytes Absolute 0.4 0.1 - 1.0 K/uL   Eosinophils Absolute 0.1 0.0 - 0.7 K/uL   Basophils Absolute 0.0 0.0 - 0.1 K/uL   Assessment/Plan:   Meridith was seen today for annual exam.  Diagnoses and all orders for this visit:  Routine physical examination  Vitamin D deficiency  Essential hypertension   Patient Counseling: [x]    Nutrition: Stressed importance of moderation in sodium/caffeine intake, saturated fat and cholesterol, caloric balance, sufficient intake of fresh fruits, vegetables, fiber, calcium, iron, and 1 mg of folate supplement per day (for females capable of pregnancy).  [x]    Stressed the importance of regular exercise.   [x]    Substance Abuse: Discussed cessation/primary prevention of tobacco, alcohol, or other drug use; driving or other dangerous activities under the influence; availability of treatment for abuse.   [x]    Injury prevention: Discussed safety belts, safety helmets, smoke detector, smoking near bedding or upholstery.   [x]    Sexuality: Discussed sexually transmitted diseases, partner selection, use of condoms, avoidance of unintended pregnancy  and contraceptive alternatives.  [x]    Dental health: Discussed importance of regular tooth brushing, flossing, and dental visits.  [x]    Health maintenance and immunizations reviewed. Please refer to Health maintenance section.   Briscoe Deutscher, DO [place]

## 2017-05-29 ENCOUNTER — Other Ambulatory Visit: Payer: Self-pay | Admitting: Family Medicine

## 2017-07-01 ENCOUNTER — Other Ambulatory Visit: Payer: Self-pay | Admitting: Family Medicine

## 2017-07-28 ENCOUNTER — Other Ambulatory Visit: Payer: Self-pay | Admitting: Family Medicine

## 2017-08-12 ENCOUNTER — Encounter: Payer: Self-pay | Admitting: Physician Assistant

## 2017-08-12 ENCOUNTER — Ambulatory Visit: Payer: BLUE CROSS/BLUE SHIELD | Admitting: Physician Assistant

## 2017-08-12 VITALS — BP 130/86 | HR 84 | Temp 99.5°F | Ht 67.0 in | Wt 177.0 lb

## 2017-08-12 DIAGNOSIS — R0789 Other chest pain: Secondary | ICD-10-CM

## 2017-08-12 DIAGNOSIS — R059 Cough, unspecified: Secondary | ICD-10-CM

## 2017-08-12 DIAGNOSIS — Z1211 Encounter for screening for malignant neoplasm of colon: Secondary | ICD-10-CM | POA: Diagnosis not present

## 2017-08-12 DIAGNOSIS — R05 Cough: Secondary | ICD-10-CM

## 2017-08-12 MED ORDER — AZITHROMYCIN 250 MG PO TABS: ORAL_TABLET | ORAL | 0 refills | Status: DC

## 2017-08-12 MED ORDER — HYDROCODONE-HOMATROPINE 5-1.5 MG/5ML PO SYRP: 5.0000 mL | ORAL_SOLUTION | Freq: Three times a day (TID) | ORAL | 0 refills | Status: DC | PRN

## 2017-08-12 NOTE — Progress Notes (Signed)
Erica [name] is a 52 y.o. female here for a new problem.  I acted as a Education administrator for [employer], PA-C Anselmo Pickler, LPN  History of Present Illness:   Chief Complaint  Patient presents with  . Cough    Cough  This is a new problem. Episode onset: Started on Tuesday. The problem has been gradually worsening. The cough is non-productive. Associated symptoms include chills, postnasal drip and a sore throat. Pertinent negatives include no headaches. Associated symptoms comments: Chest congestion hurst when coughing. The symptoms are aggravated by lying down. Treatments tried: Robitussin. The treatment provided no relief. Her past medical history is significant for bronchitis. There is no history of asthma or pneumonia.   Appetite is good. She endorses chest tightness and is unsure if it is secondary to her coughing. She is unable to bring mucus up.   She does endorse worsening GI symptoms. States that her symptoms have not improved and she was supposed to have a colonoscopy but it was never scheduled. She is taking omeprazole 20 mg daily.   She is also on Lisinopril 10 mg daily. Denies prior history of cough associated with blood pressure medication.   She states that she has a history of bronchitis x 1 and was similar to this presentation.   Past Medical History:  Diagnosis Date  . Anemia   . Depression   . Essential hypertension, benign   . Fibroids   . GERD (gastroesophageal reflux disease)   . HH (hiatus hernia)   . Hx of adenomatous colonic polyps   . IBS (irritable bowel syndrome)   . Osteopenia 10/2016  . SUI (stress urinary incontinence, female) 09/29/2016     Social History   Socioeconomic History  . Marital status: Single    Spouse name: Not on file  . Number of children: 0  . Years of education: Not on file  . Highest education level: Not on file  Occupational History  . Occupation: MI service specialist    Employer: [employer]  .  Financial resource strain: Not on file  . Food insecurity:    Worry: Not on file    Inability: Not on file  . Transportation needs:    Medical: Not on file    Non-medical: Not on file  Tobacco Use  . Smoking status: Never Smoker  . Smokeless tobacco: Never Used  Substance and Sexual Activity  . Alcohol use: No    Alcohol/week: 0.0 oz  . Drug use: No  . Sexual activity: Never    Birth control/protection: Abstinence  Lifestyle  . Physical activity:    Days per week: Not on file    Minutes per session: Not on file  . Stress: Not on file  Relationships  . Social connections:    Talks on phone: Not on file    Gets together: Not on file    Attends religious service: Not on file    Active member of club or organization: Not on file    Attends meetings of clubs or organizations: Not on file    Relationship status: Not on file  . Intimate partner violence:    Fear of current or ex partner: Not on file    Emotionally abused: Not on file    Physically abused: Not on file    Forced sexual activity: Not on file  Other Topics Concern  . Not on file  Social History Narrative  . Not on file    Past  Surgical History:  Procedure Laterality Date  . COLONOSCOPY  01/01/2005   Normal   . POLYPECTOMY    . UTERINE FIBROID EMBOLIZATION      Family History  Problem Relation Age of Onset  . Hodgkin's lymphoma Mother   . Alzheimer's disease Father   . Colon cancer Maternal Grandfather   . Stroke Paternal Grandfather   . Breast cancer Sister   . Breast cancer Maternal Aunt     No Known Allergies  Current Medications:   Current Outpatient Medications:  .  cholecalciferol (VITAMIN D) 1000 units tablet, Take 1,000 Units by mouth daily., Disp: , Rfl:  .  docusate sodium (COLACE) 100 MG capsule, Take 1 capsule (100 mg total) by mouth 2 (two) times daily., Disp: 60 capsule, Rfl: 11 .  FeFum-FePoly-FA-B Cmp-C-Biot (INTEGRA PLUS) CAPS, TAKE ONE CAPSULE EVERY DAY, Disp: 90 capsule, Rfl:  1 .  hydrochlorothiazide (HYDRODIURIL) 25 MG tablet, TAKE 1 TABLET BY MOUTH EVERY MORNING ONCE A DAY BY MOUTH 90, Disp: 90 tablet, Rfl: 1 .  lisinopril (PRINIVIL,ZESTRIL) 10 MG tablet, TAKE 1 TABLET BY MOUTH EVERY DAY, Disp: 90 tablet, Rfl: 1 .  Multiple Vitamin (MULTIVITAMIN WITH MINERALS) TABS, Take 1 tablet by mouth daily. Reported on 07/10/2015, Disp: , Rfl:  .  omeprazole (PRILOSEC) 20 MG capsule, Take 1 capsule (20 mg total) by mouth daily., Disp: 30 capsule, Rfl: 11 .  vitamin B-12 (CYANOCOBALAMIN) 1000 MCG tablet, Take 1,000 mcg by mouth daily., Disp: , Rfl:  .  azithromycin (ZITHROMAX) 250 MG tablet, Take two tablets on day one and then one tablet daily x 4 days, Disp: 6 tablet, Rfl: 0 .  HYDROcodone-homatropine (HYCODAN) 5-1.5 MG/5ML syrup, Take 5 mLs by mouth every 8 (eight) hours as needed for cough., Disp: 120 mL, Rfl: 0   Review of Systems:   Review of Systems  Constitutional: Positive for chills.  HENT: Positive for postnasal drip and sore throat.   Respiratory: Positive for cough.   Neurological: Negative for headaches.    Vitals:   Vitals:   08/12/17 1455  BP: 130/86  Pulse: 84  Temp: 99.5 F (37.5 C)  TempSrc: Oral  SpO2: 96%  Weight: 177 lb (80.3 kg)  Height: 5\' 7"  (1.702 m)     Body mass index is 27.72 kg/m.  Physical Exam:   Physical Exam  Constitutional: She appears well-developed. She is cooperative.  Non-toxic appearance. She does not have a sickly appearance. She does not appear ill. No distress.  HENT:  Head: Normocephalic and atraumatic.  Right Ear: Tympanic membrane, external ear and ear canal normal. Tympanic membrane is not erythematous, not retracted and not bulging.  Left Ear: Tympanic membrane, external ear and ear canal normal. Tympanic membrane is not erythematous, not retracted and not bulging.  Nose: Mucosal edema and rhinorrhea present. Right sinus exhibits no maxillary sinus tenderness and no frontal sinus tenderness. Left sinus exhibits  no maxillary sinus tenderness and no frontal sinus tenderness.  Mouth/Throat: Uvula is midline and mucous membranes are normal. Posterior oropharyngeal erythema present. No posterior oropharyngeal edema. Tonsils are 1+ on the right. Tonsils are 1+ on the left.  Eyes: Conjunctivae and lids are normal.  Neck: Trachea normal.  Cardiovascular: Normal rate, regular rhythm, S1 normal, S2 normal and normal heart sounds.  Pulmonary/Chest: Effort normal and breath sounds normal. She has no decreased breath sounds. She has no wheezes. She has no rhonchi. She has no rales.  Lymphadenopathy:    She has no cervical adenopathy.  Neurological:  She is alert.  Skin: Skin is warm, dry and intact.  Psychiatric: She has a normal mood and affect. Her speech is normal and behavior is normal.  Nursing note and vitals reviewed.  EKG tracing is personally reviewed.  EKG notes NSR.  No acute changes.   Assessment and Plan:    Faline was seen today for cough.  Diagnoses and all orders for this visit:  Cough Suspect multifactorial -- viral URI, GERD, and possibly medication related (lisinopril.) No red flags on exam. Start Hycodan cough syrup. I did provide safety net prescription of azithromycin should symptoms develop into bacterial URI (discussed symptoms with patient.) I told her to reach out to [place] if cough persists. If cough persists, consider chest xray, changing BP med or have patient return to GI for further evaluation of underlying GI issues.  Chest tightness or pressure EKG tracing is personally reviewed.  EKG notes NSR.  No acute changes. Suspect related to cough. Follow-up if sx worsen or persist. No red flags on exam or in HPI. -     EKG 12-Lead  Special screening for malignant neoplasms, colon -     Ambulatory referral to Gastroenterology  Other orders -     HYDROcodone-homatropine (HYCODAN) 5-1.5 MG/5ML syrup; Take 5 mLs by mouth every 8 (eight) hours as needed for cough. -     azithromycin  (ZITHROMAX) 250 MG tablet; Take two tablets on day one and then one tablet daily x 4 days    . Reviewed expectations re: course of current medical issues. . Discussed self-management of symptoms. . Outlined signs and symptoms indicating need for more acute intervention. . Patient verbalized understanding and all questions were answered. . See orders for this visit as documented in the electronic medical record. . Patient received an After-Visit Summary.  CMA or LPN served as scribe during this visit. History, Physical, and Plan performed by medical provider. Documentation and orders reviewed and attested to.  Inda Coke, PA-C

## 2017-08-12 NOTE — Patient Instructions (Signed)
It was great to see you!  You have a viral upper respiratory infection. Antibiotics are not needed for this.  Viral infections usually take 7-10 days to resolve.  The cough can last a few weeks to go away.  Use medication as prescribed: Hycodan cough syrup, may start Azithromycin if no improvement (if you decide to take, please take as prescribed) Push fluids and get plenty of rest. Please return if you are not improving as expected, or if you have high fevers (>101.5) or difficulty swallowing or worsening productive cough.  Call clinic with questions.  I hope you start feeling better soon!

## 2017-08-15 ENCOUNTER — Encounter: Payer: Self-pay | Admitting: Family Medicine

## 2017-08-15 ENCOUNTER — Ambulatory Visit: Payer: BLUE CROSS/BLUE SHIELD | Admitting: Physician Assistant

## 2017-08-15 ENCOUNTER — Ambulatory Visit: Payer: Self-pay | Admitting: *Deleted

## 2017-08-15 ENCOUNTER — Ambulatory Visit: Payer: BLUE CROSS/BLUE SHIELD | Admitting: Family Medicine

## 2017-08-15 VITALS — BP 124/82 | HR 91 | Temp 98.7°F | Ht 67.0 in | Wt 172.8 lb

## 2017-08-15 DIAGNOSIS — J040 Acute laryngitis: Secondary | ICD-10-CM

## 2017-08-15 MED ORDER — PREDNISONE 5 MG PO TABS: ORAL_TABLET | ORAL | 0 refills | Status: DC

## 2017-08-15 NOTE — Telephone Encounter (Signed)
Pt reports symptoms of hoarseness and congestion- unable to cough up secretions.Cough is non productive.Appointment made  With Inda Coke for today.   Answer Assessment - Initial Assessment Questions 1. DESCRIPTION: "Describe your voice."    Raspy  Elnoria Howard  To talk  Not  Able  To speak loudly    2. ONSET: "When did the hoarseness begin?"       Last night   3. COUGH: "Is there a cough?" If so, ask: "How bad?"        Cough    -  Severe  Seen 3  Days at office   And  Cough is worse  4. FEVER: "Do you have a fever?" If so, ask: "What is your temperature, how was it measured, and when did it start?"       Has not checked it  But sweats frequently No chills   5. RESPIRATORY STATUS: "Describe your breathing."         OK   6. ALLERGIES: "Any allergy symptoms?" If so, ask: "What are they?"        Not  Really   7. IRRITANTS: "Do you smoke?" "Have you been exposed to any irritating fumes?"      Does not. Had been cleaning with bleach recently prior to illness   8. CAUSE: "What do you think is causing the hoarseness?"       Possibly related to sinus irritation   9. OTHER SYMPTOMS: "Do you have any other symptoms?" (e.g., sore throat, swelling, foreign body, rash)      Non productive  Feels less fatigued at this time cannot clear up her chest and throat  Congestion   10. PREGNANCY: "Is there any chance you are pregnant?" "When was your last menstrual period?"          Menapause  Protocols used: [id number]

## 2017-08-15 NOTE — Telephone Encounter (Signed)
Duplicate triage encounter please disregard.

## 2017-08-15 NOTE — Progress Notes (Signed)
Erica [name] is a 52 y.o. female is here for an acute visit.   History of Present Illness:   HPI: 1 week history of upper respiratory signs and symptoms.  Was seen last Friday for similar symptoms and prescribed cough syrup.  She was given a Z-Pak prescription but told to hold it with precautions.  Over the weekend, she developed hoarseness and increased sinus pressure.  No fevers.  She has not taken a Z-Pak.  There are no preventive care reminders to display for this patient. Depression screen PHQ 2/9 11/26/2016  Decreased Interest 0  Down, Depressed, Hopeless 0  PHQ - 2 Score 0   PMHx, SurgHx, SocialHx, FamHx, Medications, and Allergies were reviewed in the Visit Navigator and updated as appropriate.   Patient Active Problem List   Diagnosis Date Noted  . Osteopenia 11/26/2016  . Vitamin D deficiency 11/26/2016  . Essential hypertension 08/04/2006  . IBS 08/04/2006   Social History   Tobacco Use  . Smoking status: Never Smoker  . Smokeless tobacco: Never Used  Substance Use Topics  . Alcohol use: No    Alcohol/week: 0.0 oz  . Drug use: No   Current Medications and Allergies:   Current Outpatient Medications:  .  azithromycin (ZITHROMAX) 250 MG tablet, Take two tablets on day one and then one tablet daily x 4 days, Disp: 6 tablet, Rfl: 0 .  cholecalciferol (VITAMIN D) 1000 units tablet, Take 1,000 Units by mouth daily., Disp: , Rfl:  .  docusate sodium (COLACE) 100 MG capsule, Take 1 capsule (100 mg total) by mouth 2 (two) times daily., Disp: 60 capsule, Rfl: 11 .  FeFum-FePoly-FA-B Cmp-C-Biot (INTEGRA PLUS) CAPS, TAKE ONE CAPSULE EVERY DAY, Disp: 90 capsule, Rfl: 1 .  hydrochlorothiazide (HYDRODIURIL) 25 MG tablet, TAKE 1 TABLET BY MOUTH EVERY MORNING ONCE A DAY BY MOUTH 90, Disp: 90 tablet, Rfl: 1 .  HYDROcodone-homatropine (HYCODAN) 5-1.5 MG/5ML syrup, Take 5 mLs by mouth every 8 (eight) hours as needed for cough., Disp: 120 mL, Rfl: 0 .  lisinopril (PRINIVIL,ZESTRIL)  10 MG tablet, TAKE 1 TABLET BY MOUTH EVERY DAY, Disp: 90 tablet, Rfl: 1 .  Multiple Vitamin (MULTIVITAMIN WITH MINERALS) TABS, Take 1 tablet by mouth daily. Reported on 07/10/2015, Disp: , Rfl:  .  omeprazole (PRILOSEC) 20 MG capsule, Take 1 capsule (20 mg total) by mouth daily., Disp: 30 capsule, Rfl: 11 .  vitamin B-12 (CYANOCOBALAMIN) 1000 MCG tablet, Take 1,000 mcg by mouth daily., Disp: , Rfl:   No Known Allergies   Review of Systems   Pertinent items are noted in the HPI. Otherwise, ROS is negative.  Vitals:   Vitals:   08/15/17 1529  BP: 124/82  Pulse: 91  Temp: 98.7 F (37.1 C)  TempSrc: Oral  SpO2: 95%  Weight: 172 lb 12.8 oz (78.4 kg)  Height: 5\' 7"  (1.702 m)     Body mass index is 27.06 kg/m. Physical Exam:   Physical Exam  Constitutional: She is oriented to person, place, and time. She appears well-developed and well-nourished. No distress.  HENT:  Head: Normocephalic and atraumatic.  Right Ear: External ear normal.  Left Ear: External ear normal.  Nose: Right sinus exhibits maxillary sinus tenderness. Left sinus exhibits maxillary sinus tenderness.  Mouth/Throat: Posterior oropharyngeal erythema present.  Eyes: Pupils are equal, round, and reactive to light. Conjunctivae and EOM are normal.  Neck: Normal range of motion. Neck supple. No thyromegaly present.  Cardiovascular: Normal rate, regular rhythm, normal heart sounds and  intact distal pulses.  Pulmonary/Chest: Effort normal and breath sounds normal.  Abdominal: Soft. Bowel sounds are normal.  Musculoskeletal: Normal range of motion.  Lymphadenopathy:    She has no cervical adenopathy.  Neurological: She is alert and oriented to person, place, and time.  Skin: Skin is warm and dry. Capillary refill takes less than 2 seconds.  Psychiatric: She has a normal mood and affect. Her behavior is normal.  Nursing note and vitals reviewed.  Assessment and Plan:   Erica [name] was seen today for hoarse.  Diagnoses  and all orders for this visit:  Acute laryngitis -     predniSONE (DELTASONE) 5 MG tablet; 6-5-4-3-2-1   . Reviewed expectations re: course of current medical issues. . Discussed self-management of symptoms. . Outlined signs and symptoms indicating need for more acute intervention. . Patient verbalized understanding and all questions were answered. Marland Kitchen Health Maintenance issues including appropriate healthy diet, exercise, and smoking avoidance were discussed with patient. . See orders for this visit as documented in the electronic medical record. . Patient received an After Visit Summary.  Erica Deutscher, DO [place], [place] 08/15/2017  Future Appointments  Date Time Provider [place]  08/15/2017  4:00 PM Erica Deutscher, DO [facility]  08/17/2017  3:45 PM Erica Bombard, MD [facility] None

## 2017-08-17 ENCOUNTER — Ambulatory Visit: Payer: BLUE CROSS/BLUE SHIELD | Admitting: Obstetrics

## 2017-08-22 ENCOUNTER — Ambulatory Visit: Payer: Self-pay | Admitting: *Deleted

## 2017-08-22 NOTE — Telephone Encounter (Signed)
Pt call with feeling like she still has some congestion in her lungs from being treated with upper respiratory infection. Denies having a fever. Unable to talk right now. She will call back when she is available to talk.

## 2017-08-23 DIAGNOSIS — R079 Chest pain, unspecified: Secondary | ICD-10-CM | POA: Diagnosis not present

## 2017-08-24 NOTE — Telephone Encounter (Addendum)
Fax received from [facility] for a call 08/23/17 8:48: Caller states she finished meds  On Sunday from last week's treatment for sinuses and upper respiratory. She is still having chest congestion and pain from this illness. She said she called the office and Monday and Tuesday and did not received a call back. Caller states finished z-pak and prednisone on Sunday for sinusitis and URI. Still having chest congestion. Also, some pain, discomfort, and burning in upper chest area and the esophagus, that has been there since she got sick. Says does not feel like her normal heartburn. Worse when she lays down. Can take deep breath without any pain or difficulty.   Says pain in chest comes and goes. Pain is not long lasting, sharp. Feels like something in her chest that is heavy is there all the time but gets heavier at times.   [facility] advised patient to call 911, call EMS. No ED visit documented in Epic.

## 2017-08-24 NOTE — Telephone Encounter (Signed)
Please call and see if she wants to come in this afternoon. May need CXR, EKG, GI cocktail. Please look into message issue.

## 2017-08-24 NOTE — Telephone Encounter (Signed)
Left message to return call to our office.  

## 2017-08-24 NOTE — Telephone Encounter (Signed)
Please advise 

## 2017-08-25 ENCOUNTER — Ambulatory Visit: Payer: BLUE CROSS/BLUE SHIELD | Admitting: Family Medicine

## 2017-08-25 ENCOUNTER — Encounter: Payer: Self-pay | Admitting: Family Medicine

## 2017-08-25 ENCOUNTER — Ambulatory Visit (INDEPENDENT_AMBULATORY_CARE_PROVIDER_SITE_OTHER): Payer: BLUE CROSS/BLUE SHIELD

## 2017-08-25 VITALS — BP 124/78 | HR 77 | Temp 98.9°F | Ht 67.5 in | Wt 173.4 lb

## 2017-08-25 DIAGNOSIS — R0989 Other specified symptoms and signs involving the circulatory and respiratory systems: Secondary | ICD-10-CM | POA: Diagnosis not present

## 2017-08-25 DIAGNOSIS — K219 Gastro-esophageal reflux disease without esophagitis: Secondary | ICD-10-CM

## 2017-08-25 DIAGNOSIS — R05 Cough: Secondary | ICD-10-CM | POA: Diagnosis not present

## 2017-08-25 DIAGNOSIS — R059 Cough, unspecified: Secondary | ICD-10-CM

## 2017-08-25 MED ORDER — PANTOPRAZOLE SODIUM 40 MG PO TBEC: 40.0000 mg | DELAYED_RELEASE_TABLET | Freq: Every day | ORAL | 3 refills | Status: DC

## 2017-08-25 NOTE — Telephone Encounter (Signed)
Called patient she states the EMS told her she did not need to go to ED. They did EKG and told her she was not having heart attack. She states that her congestion has improved some but she is having some congestion with wheezing.   App made with office today

## 2017-08-25 NOTE — Progress Notes (Signed)
Patient: Erica [name] MRN: [id number] DOB: [birth year] PCP: Briscoe Deutscher, DO     Subjective:  Chief Complaint  Patient presents with  . Cough    HPI: The patient is a 52 y.o. female who presents today for burning in her esophagus and apices of her chest.   Originally seen on 5/3 by the PA for a cough and given zpack and cough medication. EKG was done for chest tightness that was likely related to cough. EKG was NSR. She saw her PCP on 5/6 and was given oral steroids for laryngitis. Since she saw Dr. Juleen China she states the coughing has stopped, but she states her chest feels tight. It is her upper chest into her neck/esophagus. It seems to be worse at night and she sleepswith her head up which seems to help her. She describes it more as burning. It seems to also be more associated with food. She is currently on prilosec once a day and has been on it for some time. It is the generic brand and it is px brand. Her diet consists of salads and she does not eat spicy/fried foods. She does not take NSAIDs on a regular basis. She does drink de-caff coffee. She does not eat a lot of chocolate or smoke. She is always stressed, but she manages this. She did have course of steroids, and some ibuprofen which seems to also have made her symptoms worse. She does not have substernal chest pain or pain with exertion or exercise. No symptoms down her left arm, up her jaw and she denies any diaphoresis or shortness of breath.  Hx + for HTN. No diabetes, smoking. +FH of CAD in her mother at age 21.   Review of Systems  Constitutional: Positive for appetite change. Negative for chills and fever.  HENT: Positive for congestion. Negative for ear discharge, ear pain, sinus pressure and sinus pain.   Eyes: Negative for discharge and itching.  Respiratory: Positive for cough, chest tightness and wheezing. Negative for shortness of breath.        Chest tightness at night  Cardiovascular: Negative for chest pain and  palpitations.  Gastrointestinal: Negative for abdominal pain and blood in stool.  Endocrine: Negative for cold intolerance and heat intolerance.  Genitourinary: Negative for dyspareunia and dysuria.  Musculoskeletal: Negative for back pain and gait problem.  Skin: Negative for rash and wound.  Allergic/Immunologic: Negative for food allergies.  Neurological: Negative for dizziness and headaches.    Allergies Patient has No Known Allergies.  Past Medical History Patient  has a past medical history of Anemia, Depression, Essential hypertension, benign, Fibroids, GERD (gastroesophageal reflux disease), HH (hiatus hernia), adenomatous colonic polyps, IBS (irritable bowel syndrome), Osteopenia (10/2016), and SUI (stress urinary incontinence, female) (09/29/2016).  Surgical History Patient  has a past surgical history that includes Colonoscopy (01/01/2005); Uterine fibroid embolization; and Polypectomy.  Family History [name] family history includes Alzheimer's disease in her father; Breast cancer in her maternal aunt and sister; Colon cancer in her maternal grandfather; Hodgkin's lymphoma in her mother; Stroke in her paternal grandfather.  Social History Patient  reports that she has never smoked. She has never used smokeless tobacco. She reports that she does not drink alcohol or use drugs.    Objective: Vitals:   08/25/17 1447  BP: 124/78  Pulse: 77  Temp: 98.9 F (37.2 C)  TempSrc: Oral  SpO2: 98%  Weight: 173 lb 6.4 oz (78.7 kg)  Height: 5' 7.5" (1.715 m)    Body mass  index is 26.76 kg/m.  Physical Exam  Constitutional: She appears well-developed and well-nourished.  HENT:  Right Ear: External ear normal.  Left Ear: External ear normal.  Nose: Nose normal.  Mouth/Throat: Oropharynx is clear and moist.  Neck: Normal range of motion. Neck supple. No JVD present.  Cardiovascular: Normal rate, regular rhythm, normal heart sounds and intact distal pulses.   Pulmonary/Chest: Effort normal and breath sounds normal. She has no wheezes.  Abdominal: Soft. Bowel sounds are normal.  Lymphadenopathy:    She has no cervical adenopathy.  Vitals reviewed.  CXR: clear. Official read pending.     Assessment/plan: 1. Gastroesophageal reflux disease, esophagitis presence not specified Going to change her to protonix 40mg  daily. Want her to take this on an empty stomach in the AM 30 minutes before breakfast. Likely worsening reflux due to steroids/NSAIDS as well. She has completed her steroids and Im having her stop the ibuprofen. Her cough has resolved. If protonix is expensive, she can double up on her prilosec. Also discussed after one month, would go back to her 20mg  of her prilosec as we want her on the lowest dose possible of this medication. Precautions given for any actual chest pain in middle of her chest, shortness of breath, symptoms in jaw/arm to go to ER. If symptoms persist despite change in medication, f/u with PCP.  Handout given on heartburn.  - DG Chest 2 View; Future    Return if symptoms worsen or fail to improve.    Orma Flaming, MD [place]  08/25/2017

## 2017-08-30 ENCOUNTER — Other Ambulatory Visit: Payer: Self-pay | Admitting: Family Medicine

## 2017-08-30 DIAGNOSIS — Z1231 Encounter for screening mammogram for malignant neoplasm of breast: Secondary | ICD-10-CM

## 2017-09-01 ENCOUNTER — Encounter: Payer: Self-pay | Admitting: Obstetrics

## 2017-09-01 ENCOUNTER — Ambulatory Visit (INDEPENDENT_AMBULATORY_CARE_PROVIDER_SITE_OTHER): Payer: BLUE CROSS/BLUE SHIELD | Admitting: Obstetrics

## 2017-09-01 VITALS — BP 104/73 | HR 76 | Ht 67.0 in | Wt 172.2 lb

## 2017-09-01 DIAGNOSIS — Z113 Encounter for screening for infections with a predominantly sexual mode of transmission: Secondary | ICD-10-CM

## 2017-09-01 DIAGNOSIS — N898 Other specified noninflammatory disorders of vagina: Secondary | ICD-10-CM

## 2017-09-01 DIAGNOSIS — Z124 Encounter for screening for malignant neoplasm of cervix: Secondary | ICD-10-CM | POA: Diagnosis not present

## 2017-09-01 DIAGNOSIS — Z780 Asymptomatic menopausal state: Secondary | ICD-10-CM

## 2017-09-01 DIAGNOSIS — Z01419 Encounter for gynecological examination (general) (routine) without abnormal findings: Secondary | ICD-10-CM

## 2017-09-01 DIAGNOSIS — Z1151 Encounter for screening for human papillomavirus (HPV): Secondary | ICD-10-CM

## 2017-09-01 NOTE — Progress Notes (Signed)
Subjective:        Erica [name] is a 52 y.o. female here for a routine exam.  Current complaints: None.    Personal health questionnaire:  Is patient [name], have a family history of breast and/or ovarian cancer: yes Is there a family history of uterine cancer diagnosed at age < 16, gastrointestinal cancer, urinary tract cancer, family member who is a Field seismologist syndrome-associated carrier: no Is the patient overweight and hypertensive, family history of diabetes, personal history of gestational diabetes, preeclampsia or PCOS: no Is patient over 53, have PCOS,  family history of premature CHD under age 27, diabetes, smoke, have hypertension or peripheral artery disease:  no At any time, has a partner hit, kicked or otherwise hurt or frightened you?: no Over the past 2 weeks, have you felt down, depressed or hopeless?: no Over the past 2 weeks, have you felt little interest or pleasure in doing things?:no   Gynecologic History Patient's last menstrual period was 11/02/2012 (approximate). Contraception: post menopausal status Last Pap: 2018. Results were: normal Last mammogram: 2018. Results were: normal  Obstetric History OB History  No data available    Past Medical History:  Diagnosis Date  . Anemia   . Depression   . Essential hypertension, benign   . Fibroids   . GERD (gastroesophageal reflux disease)   . HH (hiatus hernia)   . Hx of adenomatous colonic polyps   . IBS (irritable bowel syndrome)   . Osteopenia 10/2016  . SUI (stress urinary incontinence, female) 09/29/2016    Past Surgical History:  Procedure Laterality Date  . COLONOSCOPY  01/01/2005   Normal   . POLYPECTOMY    . UTERINE FIBROID EMBOLIZATION       Current Outpatient Medications:  .  cholecalciferol (VITAMIN D) 1000 units tablet, Take 1,000 Units by mouth daily., Disp: , Rfl:  .  FeFum-FePoly-FA-B Cmp-C-Biot (INTEGRA PLUS) CAPS, TAKE ONE CAPSULE EVERY DAY, Disp: 90 capsule, Rfl: 1 .   hydrochlorothiazide (HYDRODIURIL) 25 MG tablet, TAKE 1 TABLET BY MOUTH EVERY MORNING ONCE A DAY BY MOUTH 90, Disp: 90 tablet, Rfl: 1 .  HYDROcodone-homatropine (HYCODAN) 5-1.5 MG/5ML syrup, Take 5 mLs by mouth every 8 (eight) hours as needed for cough., Disp: 120 mL, Rfl: 0 .  lisinopril (PRINIVIL,ZESTRIL) 10 MG tablet, TAKE 1 TABLET BY MOUTH EVERY DAY, Disp: 90 tablet, Rfl: 1 .  Multiple Vitamin (MULTIVITAMIN WITH MINERALS) TABS, Take 1 tablet by mouth daily. Reported on 07/10/2015, Disp: , Rfl:  .  pantoprazole (PROTONIX) 40 MG tablet, Take 1 tablet (40 mg total) by mouth daily., Disp: 30 tablet, Rfl: 3 .  vitamin B-12 (CYANOCOBALAMIN) 1000 MCG tablet, Take 1,000 mcg by mouth daily., Disp: , Rfl:  .  docusate sodium (COLACE) 100 MG capsule, Take 1 capsule (100 mg total) by mouth 2 (two) times daily. (Patient not taking: Reported on 09/01/2017), Disp: 60 capsule, Rfl: 11 No Known Allergies  Social History   Tobacco Use  . Smoking status: Never Smoker  . Smokeless tobacco: Never Used  Substance Use Topics  . Alcohol use: No    Alcohol/week: 0.0 oz    Family History  Problem Relation Age of Onset  . Hodgkin's lymphoma Mother   . Alzheimer's disease Father   . Colon cancer Maternal Grandfather   . Stroke Paternal Grandfather   . Breast cancer Sister   . Breast cancer Maternal Aunt       Review of Systems  Constitutional: negative for fatigue and weight  loss Respiratory: negative for cough and wheezing Cardiovascular: negative for chest pain, fatigue and palpitations Gastrointestinal: negative for abdominal pain and change in bowel habits Musculoskeletal:negative for myalgias Neurological: negative for gait problems and tremors Behavioral/Psych: negative for abusive relationship, depression Endocrine: negative for temperature intolerance    Genitourinary:negative for abnormal menstrual periods, genital lesions, hot flashes, sexual problems and vaginal discharge Integument/breast:  negative for breast lump, breast tenderness, nipple discharge and skin lesion(s)    Objective:       BP 104/73   Pulse 76   Ht 5\' 7"  (1.702 m)   Wt 172 lb 3.2 oz (78.1 kg)   LMP 11/02/2012 (Approximate) Comment: 2014  BMI 26.97 kg/m  General:   alert  Skin:   no rash or abnormalities  Lungs:   clear to auscultation bilaterally  Heart:   regular rate and rhythm, S1, S2 normal, no murmur, click, rub or gallop  Breasts:   normal without suspicious masses, skin or nipple changes or axillary nodes  Abdomen:  normal findings: no organomegaly, soft, non-tender and no hernia  Pelvis:  External genitalia: normal general appearance Urinary system: urethral meatus normal and bladder without fullness, nontender Vaginal: normal without tenderness, induration or masses Cervix: normal appearance Adnexa: normal bimanual exam Uterus: anteverted and non-tender, normal size   Lab Review Urine pregnancy test Labs reviewed yes Radiologic studies reviewed yes  50% of 20 min visit spent on counseling and coordination of care.   Assessment:    1. Encounter for routine gynecological examination with Papanicolaou smear of cervix Rx: - Cytology - PAP  2. Vaginal discharge Rx: - Cervicovaginal ancillary only  3. Menopause - doing well  4. Screen for STD (sexually transmitted disease) Rx: - Hepatitis B surface antigen - RPR - HIV antibody - Hepatitis C antibody     Plan:    Education reviewed: calcium supplements, depression evaluation, low fat, low cholesterol diet, safe sex/STD prevention, self breast exams and weight bearing exercise. Follow up in: 1 year.   No orders of the defined types were placed in this encounter.  Orders Placed This Encounter  Procedures  . Hepatitis B surface antigen  . RPR  . HIV antibody  . Hepatitis C antibody    Shelly Bombard MD 09-01-2017

## 2017-09-01 NOTE — Progress Notes (Signed)
Pt presents for annual, pap, and all STD testing. MGM scheduled in June. Colonoscopy scheduled this year per pt. No concerns today per pt.

## 2017-09-02 LAB — CERVICOVAGINAL ANCILLARY ONLY
Bacterial vaginitis: NEGATIVE
Candida vaginitis: NEGATIVE
Chlamydia: NEGATIVE
Neisseria Gonorrhea: NEGATIVE
Trichomonas: NEGATIVE

## 2017-09-02 LAB — RPR: RPR Ser Ql: NONREACTIVE

## 2017-09-02 LAB — HEPATITIS B SURFACE ANTIGEN: Hepatitis B Surface Ag: NEGATIVE

## 2017-09-02 LAB — HEPATITIS C ANTIBODY: Hep C Virus Ab: <0.1 s/co ratio (ref 0.0–0.9)

## 2017-09-02 LAB — HIV ANTIBODY (ROUTINE TESTING W REFLEX): HIV Screen 4th Generation wRfx: NONREACTIVE

## 2017-09-08 LAB — CYTOLOGY - PAP
Diagnosis: NEGATIVE
HPV: NOT DETECTED

## 2017-09-19 ENCOUNTER — Encounter: Payer: Self-pay | Admitting: Gastroenterology

## 2017-09-22 ENCOUNTER — Other Ambulatory Visit: Payer: Self-pay | Admitting: Family Medicine

## 2017-09-22 MED ORDER — VITAMIN D 1000 UNITS PO TABS: 1000.0000 [IU] | ORAL_TABLET | Freq: Every day | ORAL | 0 refills | Status: DC

## 2017-09-22 NOTE — Telephone Encounter (Signed)
LOV  08/25/17 Dr. Juleen China

## 2017-09-22 NOTE — Telephone Encounter (Signed)
Copied from [place] [id number]. Topic: Quick Communication - Rx Refill/Question >> Sep 22, 2017 10:50 AM Erica [name] B wrote: Medication: cholecalciferol (VITAMIN D) 1000 units tablet [[id number]]   Has the patient contacted their pharmacy? Yes.   (Agent: If no, request that the patient contact the pharmacy for the refill.) (Agent: If yes, when and what did the pharmacy advise?)  Preferred Pharmacy (with phone number or street name): [facility]  Agent: Please be advised that RX refills may take up to 3 business days. We ask that you follow-up with your pharmacy.

## 2017-09-22 NOTE — Telephone Encounter (Signed)
See note

## 2017-09-24 ENCOUNTER — Other Ambulatory Visit: Payer: Self-pay | Admitting: Family Medicine

## 2017-09-30 ENCOUNTER — Ambulatory Visit: Payer: BLUE CROSS/BLUE SHIELD

## 2017-09-30 ENCOUNTER — Ambulatory Visit
Admission: RE | Admit: 2017-09-30 | Discharge: 2017-09-30 | Disposition: A | Discharge status: Home / Self Care | Payer: BLUE CROSS/BLUE SHIELD | Source: Ambulatory Visit | Attending: Family Medicine | Admitting: Family Medicine

## 2017-09-30 DIAGNOSIS — Z1231 Encounter for screening mammogram for malignant neoplasm of breast: Secondary | ICD-10-CM | POA: Diagnosis not present

## 2017-10-22 ENCOUNTER — Other Ambulatory Visit: Payer: Self-pay | Admitting: Family Medicine

## 2017-11-10 ENCOUNTER — Ambulatory Visit (AMBULATORY_SURGERY_CENTER): Payer: Self-pay | Admitting: *Deleted

## 2017-11-10 VITALS — Ht 67.0 in | Wt 172.2 lb

## 2017-11-10 DIAGNOSIS — Z1211 Encounter for screening for malignant neoplasm of colon: Secondary | ICD-10-CM

## 2017-11-10 MED ORDER — PEG-KCL-NACL-NASULF-NA ASC-C 140 G PO SOLR: 1.0000 | Freq: Once | ORAL | 0 refills | Status: AC

## 2017-11-10 NOTE — Progress Notes (Signed)
Denies allergies to eggs or soy products. Denies complications with sedation or anesthesia. Denies O2 use. Denies use of diet or weight loss medications.  Emmi instructions given for colonoscopy.  

## 2017-11-12 ENCOUNTER — Other Ambulatory Visit: Payer: Self-pay | Admitting: Family Medicine

## 2017-11-18 ENCOUNTER — Encounter: Payer: Self-pay | Admitting: Gastroenterology

## 2017-11-20 ENCOUNTER — Other Ambulatory Visit: Payer: Self-pay | Admitting: Family Medicine

## 2017-11-21 ENCOUNTER — Telehealth: Payer: Self-pay | Admitting: Gastroenterology

## 2017-11-22 ENCOUNTER — Telehealth: Payer: Self-pay | Admitting: Gastroenterology

## 2017-11-22 MED ORDER — PEG-KCL-NACL-NASULF-NA ASC-C 140 G PO SOLR: 1.0000 | Freq: Once | ORAL | 0 refills | Status: AC

## 2017-11-22 NOTE — Telephone Encounter (Signed)
Called Erica [name] at [name],  to inform them we do not have instructions for the pt for peg 345. Was on hold, had to hang up.  Will contact the pt and give her a Plenvu sample. Called pt ,unable to LM. Will try again later. [employer] with pt. She will come tonight to pick up the Plenvu bowel kit. Plenvu sample lot # [id number], Exp 10/2018 left at the front desk on the 3rd floor. Pt to call if she has further questions. Gwyndolyn Saxon

## 2017-11-22 NOTE — Telephone Encounter (Signed)
Plenvu rx resent to the pharmacy and [facility] on pt's machine for her to call back with any additional questions

## 2017-11-22 NOTE — Telephone Encounter (Signed)
Mary from [employer] called to inform that plenvu is not covered, only one covered is peg 345, she wants to know if it is ok to change it. Pls call her.

## 2017-11-23 ENCOUNTER — Telehealth: Payer: Self-pay | Admitting: Gastroenterology

## 2017-11-23 ENCOUNTER — Ambulatory Visit: Payer: Self-pay | Admitting: Family Medicine

## 2017-11-23 ENCOUNTER — Encounter: Payer: Self-pay | Admitting: Family Medicine

## 2017-11-23 ENCOUNTER — Telehealth: Payer: Self-pay | Admitting: Family Medicine

## 2017-11-23 ENCOUNTER — Ambulatory Visit: Payer: BLUE CROSS/BLUE SHIELD | Admitting: Family Medicine

## 2017-11-23 VITALS — BP 128/88 | HR 73 | Temp 97.8°F | Ht 67.5 in | Wt 172.0 lb

## 2017-11-23 DIAGNOSIS — H5713 Ocular pain, bilateral: Secondary | ICD-10-CM

## 2017-11-23 DIAGNOSIS — R42 Dizziness and giddiness: Secondary | ICD-10-CM

## 2017-11-23 DIAGNOSIS — H18213 Corneal edema secondary to contact lens, bilateral: Secondary | ICD-10-CM | POA: Diagnosis not present

## 2017-11-23 DIAGNOSIS — S0500XA Injury of conjunctiva and corneal abrasion without foreign body, unspecified eye, initial encounter: Secondary | ICD-10-CM | POA: Diagnosis not present

## 2017-11-23 LAB — COMPREHENSIVE METABOLIC PANEL
ALT: 13 U/L (ref 0–35)
AST: 21 U/L (ref 0–37)
Albumin: 4.5 g/dL (ref 3.5–5.2)
Alkaline Phosphatase: 66 U/L (ref 39–117)
BUN: 22 mg/dL (ref 6–23)
CO2: 32 mEq/L (ref 19–32)
Calcium: 10.8 mg/dL — ABNORMAL HIGH (ref 8.4–10.5)
Chloride: 102 mEq/L (ref 96–112)
Creatinine, Ser: 0.93 mg/dL (ref 0.40–1.20)
GFR: 81.02 mL/min (ref >60.00)
Glucose, Bld: 93 mg/dL (ref 70–99)
Potassium: 4.2 mEq/L (ref 3.5–5.1)
Sodium: 140 mEq/L (ref 135–145)
Total Bilirubin: 0.7 mg/dL (ref 0.2–1.2)
Total Protein: 8.2 g/dL (ref 6.0–8.3)

## 2017-11-23 LAB — CBC WITH DIFFERENTIAL/PLATELET
Basophils Absolute: 0 10*3/uL (ref 0.0–0.1)
Basophils Relative: 0.6 % (ref 0.0–3.0)
Eosinophils Absolute: 0.2 10*3/uL (ref 0.0–0.7)
Eosinophils Relative: 4.2 % (ref 0.0–5.0)
HCT: 40 % (ref 36.0–46.0)
Hemoglobin: 13.5 g/dL (ref 12.0–15.0)
Lymphocytes Relative: 39.1 % (ref 12.0–46.0)
Lymphs Abs: 2 10*3/uL (ref 0.7–4.0)
MCHC: 33.7 g/dL (ref 30.0–36.0)
MCV: 88.5 fl (ref 78.0–100.0)
Monocytes Absolute: 0.4 10*3/uL (ref 0.1–1.0)
Monocytes Relative: 7.7 % (ref 3.0–12.0)
Neutro Abs: 2.4 10*3/uL (ref 1.4–7.7)
Neutrophils Relative %: 48.4 % (ref 43.0–77.0)
Platelets: 188 10*3/uL (ref 150.0–400.0)
RBC: 4.52 Mil/uL (ref 3.87–5.11)
RDW: 13.5 % (ref 11.5–15.5)
WBC: 5 10*3/uL (ref 4.0–10.5)

## 2017-11-23 LAB — TSH: TSH: 0.63 u[IU]/mL (ref 0.35–4.50)

## 2017-11-23 LAB — VITAMIN D 25 HYDROXY (VIT D DEFICIENCY, FRACTURES): VITD: 31.87 ng/mL (ref 30.00–100.00)

## 2017-11-23 NOTE — Progress Notes (Signed)
Patient: Erica [name] MRN: [id number] DOB: [birth year] PCP: Erica Deutscher, DO     Subjective:  Chief Complaint  Patient presents with  . Dizziness  . eye pain (hurts to look up)    HPI: The patient is a 52 y.o. female who presents today for light headedness. She started to have symptoms yesterday AM when she got to work. She doesn't really feel dizzy, it's more light headed and felt like she was going to pass out at the light this AM. She states the feeling is intermittent. Yesterday afternoon she felt fine and went to gym last night. Today it is back again. She also has bilateral eye pain that seems to be associated with this. It feels like her eyes are straining and hurt, but no actual visual changes. She denies any blurry vision/double vision/trouble seeing. She normally wears contacts and put her glasses on today, but can tell no difference in her symptoms. She put some eye drops in the other night and felt better. She went to bed after this. No correlation with the light headedness. She has no chest pain, palpitations, leg swelling, cough, shortness of breath or any URI. She has a colonoscopy scheduled for tomorrow. She has not been light headed since this AM.    Review of Systems  Eyes: Positive for pain. Negative for visual disturbance.  Respiratory: Negative for chest tightness and shortness of breath.   Cardiovascular: Negative for chest pain.  Gastrointestinal: Negative for abdominal pain and nausea.  Neurological: Positive for dizziness and light-headedness. Negative for weakness and headaches.  Psychiatric/Behavioral: Positive for sleep disturbance. The patient is nervous/anxious.    Eye exam: 20/20 with glasses.   Allergies Patient has No Known Allergies.  Past Medical History Patient  has a past medical history of Anemia, Depression, Essential hypertension, benign, Fibroids, GERD (gastroesophageal reflux disease), HH (hiatus hernia), adenomatous colonic polyps, IBS  (irritable bowel syndrome), Osteopenia (10/2016), and SUI (stress urinary incontinence, female) (09/29/2016).  Surgical History Patient  has a past surgical history that includes Colonoscopy (01/01/2005); Uterine fibroid embolization; and Polypectomy.  Family History [name] family history includes Alzheimer's disease in her father; Breast cancer in her maternal aunt and sister; Colon cancer in her maternal grandfather; Hodgkin's lymphoma in her mother; Stroke in her paternal grandfather.  Social History Patient  reports that she has never smoked. She has never used smokeless tobacco. She reports that she does not drink alcohol or use drugs.    Objective: Vitals:   11/23/17 1440  BP: 128/88  Pulse: 73  Temp: 97.8 F (36.6 C)  TempSrc: Oral  SpO2: 99%  Weight: 172 lb (78 kg)  Height: 5' 7.5" (1.715 m)    Body mass index is 26.54 kg/m.  Physical Exam  Constitutional: She is oriented to person, place, and time. She appears well-developed and well-nourished.  HENT:  Right Ear: External ear normal.  Left Ear: External ear normal.  TM partially obscured by cerumen  Cobblestoning on posterior pharynx  Eyes: Pupils are equal, round, and reactive to light. Conjunctivae and EOM are normal.  Optic nerve appears wnl bilaterally   Neck: Normal range of motion. Neck supple. No thyromegaly present.  Cardiovascular: Normal rate, regular rhythm and normal heart sounds.  No murmur heard. Pulmonary/Chest: Effort normal and breath sounds normal.  Abdominal: Soft. Bowel sounds are normal.  Lymphadenopathy:    She has no cervical adenopathy.  Neurological: She is alert and oriented to person, place, and time. She displays normal reflexes. No cranial nerve  deficit. She exhibits normal muscle tone. Coordination normal.  Vitals reviewed.      Assessment/plan: 1. Light-headed feeling Checking labs. Orthostatics wnl. Only had this feeling one time today. Make sure eating small meals to make  sure no drop in blood sugar. Will make sure no abnormal findings on labs. Keep a log to when and how often she is having these spells. If labs  Normal and not getting better will need to see pcp.  - CBC with Differential/Platelet - VITAMIN D 25 Hydroxy (Vit-D Deficiency, Fractures) - Comprehensive metabolic panel - TSH  2. Eye pain, bilateral No visual changes/disturbances. Feels like she is straining. Stay in glasses and start refresh drops daily for dry eyes. Stop the anti-histamine. Also want her to get in with eye doc today or tomorrow for more thorough eye exam as well as to check pressure in her eyes. ER precautions given.       Return if symptoms worsen or fail to improve.     Orma Flaming, MD [place] [facility]   11/23/2017

## 2017-11-23 NOTE — Telephone Encounter (Signed)
Copied from [place]. Topic: General - Other >> Nov 23, 2017  3:19 PM Cecelia Byars, NT wrote: Reason for CRM: Patient went to see her Dr Sherlean Foot and he said she scratched her eyes it was ok  when he put in numbing solution for  a minute and he called on a prescription for her ,and they also are  very dry

## 2017-11-23 NOTE — Telephone Encounter (Signed)
Noted  

## 2017-11-23 NOTE — Telephone Encounter (Signed)
Pt c/o lightheadedness and stated that her eyes hurt when she fully opens her eyes. Pt denies that the she or the room is spinning or tilting. Pt stated that the lightheadedness is mild. The lightheadedness began yesterday. Pt stated she has never had dizziness before. Other than eye pain she is nauseated. Pt stated that she thinks the nausea is from running out of her Protonix or not able to eat because she is due for a colonoscopy tomorrow.  Care advice given per protocol. Pt verbalized understanding. No availability with her PCP. Appt offered with  Dr Rogers Blocker today at 1045. Pt accepted appointment Reason for Disposition . [1] MILD dizziness (e.g., walking normally) AND [2] has NOT been evaluated by physician for this  (Exception: dizziness caused by heat exposure, sudden standing, or poor fluid intake)  Answer Assessment - Initial Assessment Questions 1. DESCRIPTION: "Describe your dizziness."     Tired and eyes hurt when fully open 2. LIGHTHEADED: "Do you feel lightheaded?" (e.g., somewhat faint, woozy, weak upon standing)     yes 3. VERTIGO: "Do you feel like either you or the room is spinning or tilting?" (i.e. vertigo)     no 4. SEVERITY: "How bad is it?"  "Do you feel like you are going to faint?" "Can you stand and walk?"   - MILD - walking normally   - MODERATE - interferes with normal activities (e.g., work, school)    - SEVERE - unable to stand, requires support to walk, feels like passing out now.      mild 5. ONSET:  "When did the dizziness begin?"     yesterday 6. AGGRAVATING FACTORS: "Does anything make it worse?" (e.g., standing, change in head position)     Fully opening eyes 7. HEART RATE: "Can you tell me your heart rate?" "How many beats in 15 seconds?"  (Note: not all patients can do this)       Pt does not know how to do this 8. CAUSE: "What do you think is causing the dizziness?"    no 9. RECURRENT SYMPTOM: "Have you had dizziness before?" If so, ask: "When was the last  time?" "What happened that time?"     no 10. OTHER SYMPTOMS: "Do you have any other symptoms?" (e.g., fever, chest pain, vomiting, diarrhea, bleeding)       Eye pain, nauseated, unable to open eyes fully 11. PREGNANCY: "Is there any chance you are pregnant?" "When was your last menstrual period?"       n/a  Protocols used: DIZZINESS [facility]

## 2017-11-23 NOTE — Telephone Encounter (Signed)
See note

## 2017-11-23 NOTE — Telephone Encounter (Signed)
Patient just wanted to let doctor know that she is doing better.

## 2017-11-23 NOTE — Telephone Encounter (Signed)
Calling back to go ahead and cancel her Colonoscopy for tomorrow 11-24-17 at 10:30am. Tells me her eye doctor advised her to postpone the Colonoscopy for now. She is feeling light headed and a lot of eye pressure. She is very sorry and will call back to reschedule as soon as her eyes are feeling better.

## 2017-11-23 NOTE — Patient Instructions (Signed)
Try refresh eye drops for dry eyes...  Get into your eye doc today or tomorrow for eye exam Labs today. Eat small meals.

## 2017-11-24 ENCOUNTER — Encounter: Payer: BLUE CROSS/BLUE SHIELD | Admitting: Gastroenterology

## 2017-11-24 ENCOUNTER — Other Ambulatory Visit: Payer: Self-pay | Admitting: Family Medicine

## 2017-11-24 NOTE — Telephone Encounter (Signed)
Wonderful. Noted

## 2017-11-25 ENCOUNTER — Other Ambulatory Visit: Payer: Self-pay | Admitting: Family Medicine

## 2017-12-17 ENCOUNTER — Other Ambulatory Visit: Payer: Self-pay | Admitting: Family Medicine

## 2017-12-24 ENCOUNTER — Other Ambulatory Visit: Payer: Self-pay | Admitting: Family Medicine

## 2017-12-25 DIAGNOSIS — H16143 Punctate keratitis, bilateral: Secondary | ICD-10-CM | POA: Diagnosis not present

## 2017-12-28 DIAGNOSIS — H16143 Punctate keratitis, bilateral: Secondary | ICD-10-CM | POA: Diagnosis not present

## 2018-01-02 ENCOUNTER — Ambulatory Visit: Payer: BLUE CROSS/BLUE SHIELD | Admitting: Family Medicine

## 2018-01-02 ENCOUNTER — Encounter: Payer: Self-pay | Admitting: Family Medicine

## 2018-01-02 VITALS — BP 124/84 | HR 83 | Temp 99.1°F | Ht 67.5 in | Wt 172.8 lb

## 2018-01-02 DIAGNOSIS — K219 Gastro-esophageal reflux disease without esophagitis: Secondary | ICD-10-CM | POA: Diagnosis not present

## 2018-01-02 DIAGNOSIS — B9689 Other specified bacterial agents as the cause of diseases classified elsewhere: Secondary | ICD-10-CM

## 2018-01-02 DIAGNOSIS — R509 Fever, unspecified: Secondary | ICD-10-CM | POA: Diagnosis not present

## 2018-01-02 DIAGNOSIS — J329 Chronic sinusitis, unspecified: Secondary | ICD-10-CM | POA: Diagnosis not present

## 2018-01-02 LAB — POC INFLUENZA A&B (BINAX/QUICKVUE)
Influenza A, POC: NEGATIVE
Influenza B, POC: NEGATIVE

## 2018-01-02 MED ORDER — PANTOPRAZOLE SODIUM 40 MG PO TBEC: 40.0000 mg | DELAYED_RELEASE_TABLET | Freq: Every day | ORAL | 1 refills | Status: DC

## 2018-01-02 MED ORDER — AMOXICILLIN-POT CLAVULANATE 875-125 MG PO TABS: 1.0000 | ORAL_TABLET | Freq: Two times a day (BID) | ORAL | 0 refills | Status: AC

## 2018-01-02 NOTE — Patient Instructions (Addendum)
Sinsusitis Viral based on <10 days, no double sickening, lack of severity of symptoms in first 3 days. Educated on signs that bacterial infection may have developed (symptoms over 10 days, double sickening). Flu test negative today  Treatment: -other symptomatic care with plain mucinex to help loosen congestion -Antibiotic indicated: no but we discussed if she fails to improve by Saturday (day 10) or symptoms worsen then improve that she could take augmentin (printed for her today)  Finally, we reviewed reasons to return to care including if symptoms worsen or persist or new concerns arise (particularly fever or shortness of breath)

## 2018-01-02 NOTE — Progress Notes (Signed)
PCP: Briscoe Deutscher, DO  Subjective:  Erica [name] is a 52 y.o. year old very pleasant female patient who presents with sinusitis symptoms including nasal congestion, sinus tenderness 5 days of symptoms Started with sore throat and then noted sinus pressure, headaches,  Worsening Sinus pressure, headaches, fatigue by day two of illness. White discharge from nasal turbinates. Some blood yesterday but none today.  Has noted some sweating at night.  hasnt checked temperature at home.  No body aches Getting slightly better. Coughing not improving.  No known flu contacts  Has seen eye doctor four times for dry eyes. Still having issues with dry eyes- encouraged follow up.   ROS-denies fever, SOB, NVD. Has had some upper dental/tooth pain  Pertinent Past Medical History-  Patient Active Problem List   Diagnosis Date Noted  . Osteopenia 11/26/2016  . Vitamin D deficiency 11/26/2016  . Essential hypertension 08/04/2006  . IBS 08/04/2006    Medications- reviewed  Current Outpatient Medications  Medication Sig Dispense Refill  . BIOTIN PO Take by mouth.    . cholecalciferol (VITAMIN D) 1000 units tablet TAKE 1 TABLET BY MOUTH EVERY DAY 30 tablet 2  . COLLAGEN PO Take by mouth.    . docusate sodium (COLACE) 100 MG capsule Take 1 capsule (100 mg total) by mouth 2 (two) times daily. 60 capsule 11  . FeFum-FePoly-FA-B Cmp-C-Biot (INTEGRA PLUS) CAPS TAKE 1 CAPSULE BY MOUTH EVERY DAY 90 capsule 1  . hydrochlorothiazide (HYDRODIURIL) 25 MG tablet TAKE 1 TABLET BY MOUTH EVERY MORNING ONCE A DAY BY MOUTH 90 tablet 0  . lisinopril (PRINIVIL,ZESTRIL) 10 MG tablet TAKE 1 TABLET BY MOUTH EVERY DAY 90 tablet 1  . Multiple Vitamin (MULTIVITAMIN WITH MINERALS) TABS Take 1 tablet by mouth daily. Reported on 07/10/2015    . vitamin B-12 (CYANOCOBALAMIN) 1000 MCG tablet Take 1,000 mcg by mouth daily.    . pantoprazole (PROTONIX) 40 MG tablet Take 1 tablet (40 mg total) by mouth daily. 90 tablet 1    No current facility-administered medications for this visit.     Objective: BP 124/84 (BP Location: Left Arm, Patient Position: Sitting, Cuff Size: Large)   Pulse 83   Temp 99.1 F (37.3 C) (Oral)   Ht 5' 7.5" (1.715 m)   Wt 172 lb 12.8 oz (78.4 kg)   SpO2 98%   BMI 26.66 kg/m  Gen: NAD, resting comfortably HEENT: Turbinates erythematous with yellow drainage, TM normal, pharynx mildly erythematous with no tonsilar exudate or edema, left maxillary sinus tenderness CV: RRR no murmurs rubs or gallops Lungs: CTAB no crackles, wheeze, rhonchi Ext: no edema Skin: warm, dry, no rash PERRLA once glasses removed.   Results for orders placed or performed in visit on 01/02/18 (from the past 24 hour(s))  POC Influenza A&B(BINAX/QUICKVUE)     Status: None   Collection Time: 01/02/18  4:07 PM  Result Value Ref Range   Influenza A, POC Negative Negative   Influenza B, POC Negative Negative   Assessment/Plan:  Sinsusitis Viral based on <10 days, no double sickening, lack of severity of symptoms in first 3 days. Educated on signs that bacterial infection may have developed (symptoms over 10 days, double sickening). Flu test negative today  Treatment: -other symptomatic care with plain mucinex to help loosen congestion originally advised but she states threw up with this - has a hydrocodone cough syrup at home. Asked her to try delsym in day to help stretch that medicine but if she runs out I  would refill it. Would need to call in with name -Antibiotic indicated: no but we discussed if she fails to improve by Saturday (day 10) or symptoms worsen then improve that she could take augmentin (printed for her today)  Finally, we reviewed reasons to return to care including if symptoms worsen or persist or new concerns arise (particularly fever or shortness of breath)  Other notes: 1.shoudl continue to work with optho about dry eye issues (dont strongly suspect related to current issue) 2. Asks  for refill for chronic GERD medication- I advised  Go ahead and schedule follow up with PCP in January- wonder if dose could be reduced to 20mg  Meds ordered this encounter  Medications  . pantoprazole (PROTONIX) 40 MG tablet    Sig: Take 1 tablet (40 mg total) by mouth daily.    Dispense:  90 tablet    Refill:  1   Garret Reddish, MD

## 2018-01-27 ENCOUNTER — Other Ambulatory Visit (INDEPENDENT_AMBULATORY_CARE_PROVIDER_SITE_OTHER): Payer: BLUE CROSS/BLUE SHIELD

## 2018-01-27 LAB — COMPREHENSIVE METABOLIC PANEL
ALT: 13 U/L (ref 0–35)
AST: 16 U/L (ref 0–37)
Albumin: 4.1 g/dL (ref 3.5–5.2)
Alkaline Phosphatase: 62 U/L (ref 39–117)
BUN: 25 mg/dL — ABNORMAL HIGH (ref 6–23)
CO2: 30 mEq/L (ref 19–32)
Calcium: 10 mg/dL (ref 8.4–10.5)
Chloride: 103 mEq/L (ref 96–112)
Creatinine, Ser: 1 mg/dL (ref 0.40–1.20)
GFR: 74.46 mL/min (ref >60.00)
Glucose, Bld: 79 mg/dL (ref 70–99)
Potassium: 4.1 mEq/L (ref 3.5–5.1)
Sodium: 139 mEq/L (ref 135–145)
Total Bilirubin: 0.7 mg/dL (ref 0.2–1.2)
Total Protein: 7.5 g/dL (ref 6.0–8.3)

## 2018-02-28 ENCOUNTER — Ambulatory Visit: Payer: BLUE CROSS/BLUE SHIELD | Admitting: Obstetrics

## 2018-02-28 ENCOUNTER — Encounter: Payer: Self-pay | Admitting: Obstetrics

## 2018-02-28 VITALS — BP 131/83 | HR 75 | Wt 176.6 lb

## 2018-02-28 DIAGNOSIS — Z113 Encounter for screening for infections with a predominantly sexual mode of transmission: Secondary | ICD-10-CM

## 2018-02-28 DIAGNOSIS — K5901 Slow transit constipation: Secondary | ICD-10-CM | POA: Diagnosis not present

## 2018-02-28 DIAGNOSIS — Z1211 Encounter for screening for malignant neoplasm of colon: Secondary | ICD-10-CM | POA: Diagnosis not present

## 2018-02-28 DIAGNOSIS — N898 Other specified noninflammatory disorders of vagina: Secondary | ICD-10-CM

## 2018-02-28 DIAGNOSIS — N760 Acute vaginitis: Secondary | ICD-10-CM | POA: Diagnosis not present

## 2018-02-28 DIAGNOSIS — B9689 Other specified bacterial agents as the cause of diseases classified elsewhere: Secondary | ICD-10-CM

## 2018-02-28 MED ORDER — METRONIDAZOLE 0.75 % VA GEL: 1.0000 | Freq: Two times a day (BID) | VAGINAL | 4 refills | Status: DC

## 2018-02-28 MED ORDER — DICYCLOMINE HCL 20 MG PO TABS: 20.0000 mg | ORAL_TABLET | Freq: Three times a day (TID) | ORAL | 5 refills | Status: DC

## 2018-02-28 NOTE — Progress Notes (Signed)
Pt is here for vaginal discharge and odor for 3 days.

## 2018-02-28 NOTE — Progress Notes (Signed)
Patient ID: Erica [name], female   DOB: [birth year], 52 y.o.   MRN: [id number]  Chief Complaint  Patient presents with  . Vaginal Discharge    HPI Erica [name] is a 52 y.o. female.  Malodorous vaginal discharge. HPI  Past Medical History:  Diagnosis Date  . Anemia   . Depression   . Essential hypertension, benign   . Fibroids   . GERD (gastroesophageal reflux disease)   . HH (hiatus hernia)   . Hx of adenomatous colonic polyps   . IBS (irritable bowel syndrome)   . Osteopenia 10/2016  . SUI (stress urinary incontinence, female) 09/29/2016    Past Surgical History:  Procedure Laterality Date  . COLONOSCOPY  01/01/2005   Normal   . POLYPECTOMY    . UTERINE FIBROID EMBOLIZATION      Family History  Problem Relation Age of Onset  . Hodgkin's lymphoma Mother   . Alzheimer's disease Father   . Colon cancer Maternal Grandfather   . Stroke Paternal Grandfather   . Breast cancer Sister   . Breast cancer Maternal Aunt   . Esophageal cancer Neg Hx   . Stomach cancer Neg Hx   . Rectal cancer Neg Hx     Social History Social History   Tobacco Use  . Smoking status: Never Smoker  . Smokeless tobacco: Never Used  Substance Use Topics  . Alcohol use: No    Alcohol/week: 0.0 standard drinks  . Drug use: No    No Known Allergies  Current Outpatient Medications  Medication Sig Dispense Refill  . BIOTIN PO Take by mouth.    . cholecalciferol (VITAMIN D) 1000 units tablet TAKE 1 TABLET BY MOUTH EVERY DAY 30 tablet 2  . COLLAGEN PO Take by mouth.    . docusate sodium (COLACE) 100 MG capsule Take 1 capsule (100 mg total) by mouth 2 (two) times daily. 60 capsule 11  . FeFum-FePoly-FA-B Cmp-C-Biot (INTEGRA PLUS) CAPS TAKE 1 CAPSULE BY MOUTH EVERY DAY 90 capsule 1  . hydrochlorothiazide (HYDRODIURIL) 25 MG tablet TAKE 1 TABLET BY MOUTH EVERY MORNING ONCE A DAY BY MOUTH 90 tablet 0  . lisinopril (PRINIVIL,ZESTRIL) 10 MG tablet TAKE 1 TABLET BY MOUTH EVERY DAY 90 tablet 1  .  Multiple Vitamin (MULTIVITAMIN WITH MINERALS) TABS Take 1 tablet by mouth daily. Reported on 07/10/2015    . pantoprazole (PROTONIX) 40 MG tablet Take 1 tablet (40 mg total) by mouth daily. 90 tablet 1  . dicyclomine (BENTYL) 20 MG tablet Take 1 tablet (20 mg total) by mouth 3 (three) times daily before meals. 90 tablet 5  . metroNIDAZOLE (METROGEL VAGINAL) 0.75 % vaginal gel Place 1 Applicatorful vaginally 2 (two) times daily. 70 g 4  . vitamin B-12 (CYANOCOBALAMIN) 1000 MCG tablet Take 1,000 mcg by mouth daily.     No current facility-administered medications for this visit.     Review of Systems Review of Systems Constitutional: negative for fatigue and weight loss Respiratory: negative for cough and wheezing Cardiovascular: negative for chest pain, fatigue and palpitations Gastrointestinal: negative for abdominal pain and change in bowel habits Genitourinary:negative Integument/breast: negative for nipple discharge Musculoskeletal:negative for myalgias Neurological: negative for gait problems and tremors Behavioral/Psych: negative for abusive relationship, depression Endocrine: negative for temperature intolerance      Blood pressure 131/83, pulse 75, weight 176 lb 9.6 oz (80.1 kg).  Physical Exam Physical Exam General:   alert  Skin:   no rash or abnormalities  Lungs:   clear to  auscultation bilaterally  Heart:   regular rate and rhythm, S1, S2 normal, no murmur, click, rub or gallop  Breasts:   normal without suspicious masses, skin or nipple changes or axillary nodes  Abdomen:  normal findings: no organomegaly, soft, non-tender and no hernia  Pelvis:  External genitalia: normal general appearance Urinary system: urethral meatus normal and bladder without fullness, nontender Vaginal: normal without tenderness, induration or masses Cervix: normal appearance Adnexa: normal bimanual exam Uterus: anteverted and non-tender, normal size    50% of 15 min visit spent on counseling  and coordination of care.   Data Reviewed Wet Prep  Assessment     1. Vaginal discharge Rx: - Cervicovaginal ancillary only  2. Vaginal irritation  3. Constipation due to slow transit Rx: - dicyclomine (BENTYL) 20 MG tablet; Take 1 tablet (20 mg total) by mouth 3 (three) times daily before meals.  Dispense: 90 tablet; Refill: 5 - Ambulatory referral to Gastroenterology  4. Screen for colon cancer Rx: - Ambulatory referral to Gastroenterology  5. BV (bacterial vaginosis) Rx: - metroNIDAZOLE (METROGEL VAGINAL) 0.75 % vaginal gel; Place 1 Applicatorful vaginally 2 (two) times daily.  Dispense: 70 g; Refill: 4    Plan    Follow up prn   Orders Placed This Encounter  Procedures  . Ambulatory referral to Gastroenterology    Referral Priority:   Routine    Referral Type:   Consultation    Referral Reason:   Specialty Services Required    Number of Visits Requested:   1   Meds ordered this encounter  Medications  . dicyclomine (BENTYL) 20 MG tablet    Sig: Take 1 tablet (20 mg total) by mouth 3 (three) times daily before meals.    Dispense:  90 tablet    Refill:  5  . metroNIDAZOLE (METROGEL VAGINAL) 0.75 % vaginal gel    Sig: Place 1 Applicatorful vaginally 2 (two) times daily.    Dispense:  70 g    Refill:  4    Shelly Bombard MD 02-28-2018

## 2018-03-01 LAB — CERVICOVAGINAL ANCILLARY ONLY
Bacterial vaginitis: NEGATIVE
Candida vaginitis: NEGATIVE
Chlamydia: NEGATIVE
Neisseria Gonorrhea: NEGATIVE
Trichomonas: NEGATIVE

## 2018-03-03 ENCOUNTER — Ambulatory Visit: Payer: BLUE CROSS/BLUE SHIELD | Admitting: Family Medicine

## 2018-03-03 ENCOUNTER — Encounter: Payer: Self-pay | Admitting: Family Medicine

## 2018-03-03 VITALS — BP 110/80 | HR 56 | Temp 98.2°F | Ht 67.5 in | Wt 177.0 lb

## 2018-03-03 DIAGNOSIS — K5900 Constipation, unspecified: Secondary | ICD-10-CM

## 2018-03-03 DIAGNOSIS — R159 Full incontinence of feces: Secondary | ICD-10-CM | POA: Diagnosis not present

## 2018-03-03 NOTE — Patient Instructions (Signed)
Please see GI!!! Need colonoscopy!

## 2018-03-03 NOTE — Progress Notes (Signed)
Patient: Erica [name] MRN: [id number] DOB: 02/09/1966 PCP: Erica Deutscher, DO     Subjective:  Chief Complaint  Patient presents with  . rectal leakage    x 2 wks    HPI: The patient is a 52 y.o. female who presents today for rectal leakage x 2 wks. She does have IBS, but has never had leakage. She states she doesn't have poop coming out, it's more like water. She notices it more in the AM and can feel it. Nothing is ever on her panty's. She states she can feel stuff come out of her anus and when she wipes after urinating she will have some brown/water from her anus when she wipes. She can feel it leaking during the day if she is constipated and can't poop. She has had colonoscopies in the past. She was due to have one recently and had to reschedule. No blood in stool. No FH of colon cancer in mom/dad, but her maternal grandfather had colon cancer. Her stomach feels bloated and is typical with IBS, but this seems to have lasted longer. Saw GYN and they did referral for GI and gave her IBS medication.   Review of Systems  Constitutional: Negative for chills, fever and unexpected weight change.  Gastrointestinal: Positive for abdominal distention, abdominal pain, constipation and diarrhea. Negative for anal bleeding, blood in stool, nausea, rectal pain and vomiting.  Genitourinary: Negative for dysuria.    Allergies Patient has No Known Allergies.  Past Medical History Patient  has a past medical history of Anemia, Depression, Essential hypertension, benign, Fibroids, GERD (gastroesophageal reflux disease), HH (hiatus hernia), adenomatous colonic polyps, IBS (irritable bowel syndrome), Osteopenia (10/2016), and SUI (stress urinary incontinence, female) (09/29/2016).  Surgical History Patient  has a past surgical history that includes Colonoscopy (01/01/2005); Uterine fibroid embolization; and Polypectomy.  Family History [name] family history includes Alzheimer's disease in her  father; Breast cancer in her maternal aunt and sister; Colon cancer in her maternal grandfather; Hodgkin's lymphoma in her mother; Stroke in her paternal grandfather.  Social History Patient  reports that she has never smoked. She has never used smokeless tobacco. She reports that she does not drink alcohol or use drugs.    Objective: Vitals:   03/03/18 1001  BP: 110/80  Pulse: (!) 56  Temp: 98.2 F (36.8 C)  TempSrc: Oral  SpO2: 97%  Weight: 177 lb (80.3 kg)  Height: 5' 7.5" (1.715 m)    Body mass index is 27.31 kg/m.  Physical Exam  Constitutional: She appears well-developed and well-nourished.  Cardiovascular: Normal rate, regular rhythm and normal heart sounds.  Pulmonary/Chest: Effort normal and breath sounds normal.  Abdominal: Soft. Bowel sounds are normal. She exhibits no distension. There is no tenderness.  Genitourinary:  Genitourinary Comments: Rectal exam: tone possibly slightly decreased. No hemorrhoids palpated or other abnormality   Vitals reviewed.      Assessment/plan: 1. Fecal incontinence alternating with constipation Large differential and unsure if truly leaking fecal matter. On no drugs that would contribute to this, no surgeries, no symptoms/known IBD. She does have IBS and constipation which could be contributing cause; however, she is overdue for colonoscopy and discussed she needs to get this done to make sure no outlet obstruction as well. Also may need manometry per GI.   FODMOP diet given for bloating. F/u with PCP/GI if no improvement.     Return if symptoms worsen or fail to improve.   Erica Flaming, MD [place]   03/03/2018

## 2018-03-05 ENCOUNTER — Other Ambulatory Visit: Payer: Self-pay | Admitting: Family Medicine

## 2018-03-07 ENCOUNTER — Ambulatory Visit: Payer: BLUE CROSS/BLUE SHIELD | Admitting: Gastroenterology

## 2018-03-14 ENCOUNTER — Other Ambulatory Visit: Payer: Self-pay | Admitting: Family Medicine

## 2018-04-03 ENCOUNTER — Ambulatory Visit: Payer: BLUE CROSS/BLUE SHIELD | Admitting: Physician Assistant

## 2018-04-19 ENCOUNTER — Ambulatory Visit: Payer: BLUE CROSS/BLUE SHIELD | Admitting: Physician Assistant

## 2018-04-19 ENCOUNTER — Encounter: Payer: Self-pay | Admitting: Physician Assistant

## 2018-04-19 VITALS — BP 110/66 | HR 72 | Ht 67.0 in | Wt 172.0 lb

## 2018-04-19 DIAGNOSIS — K5909 Other constipation: Secondary | ICD-10-CM | POA: Diagnosis not present

## 2018-04-19 DIAGNOSIS — Z1211 Encounter for screening for malignant neoplasm of colon: Secondary | ICD-10-CM | POA: Diagnosis not present

## 2018-04-19 MED ORDER — PEG-KCL-NACL-NASULF-NA ASC-C 140 G PO SOLR: 1.0000 | Freq: Once | ORAL | 0 refills | Status: AC

## 2018-04-19 NOTE — Progress Notes (Signed)
Thank you for sending this case to me. I have reviewed the entire note, and the outlined plan seems appropriate.   Erin Uecker Danis, MD  

## 2018-04-19 NOTE — Patient Instructions (Signed)

## 2018-04-19 NOTE — Progress Notes (Signed)
Chief Complaint: Rectal leakage  HPI:    Erica [name] is a 53 year old African-American female with a past medical history as listed below including IBS, known to Dr. Loletha Carrow, who was referred to me by Briscoe Deutscher, DO for a complaint of rectal leakage.      11/25/2016 office visit with Dr. Rosana Hoes to discuss bloating and constipation.  At that time it was discussed this appeared to be functional with possibly some element of pelvic floor dysfunction or dyssynergy defecation.  She was given samples of IBgard and it was recommended she contact us as she was in need for routine screening colonoscopy.    11/24/2017 patient canceled colonoscopy due to recent eye procedure and some complications.    03/03/2018 office visit with PCP to discuss rectal leakage.  At that time, it was recommended that she have follow-up with [place] in regards to colonoscopy and possible anal manometry.  She was given the low FODMAP diet.    Today, the patient presents to clinic and explains that she started juicing with a group at her gym and since doing so her constipation and bloating are completely relieved and she has had no further leakage of stool.  Aware that she needs to schedule for screening colonoscopy and would like to do so now.    Denies fever, chills, weight loss, blood in her stool or symptoms that awaken her from sleep.  Past Medical History:  Diagnosis Date  . Anemia   . Depression   . Essential hypertension, benign   . Fibroids   . GERD (gastroesophageal reflux disease)   . HH (hiatus hernia)   . Hx of adenomatous colonic polyps   . IBS (irritable bowel syndrome)   . Osteopenia 10/2016  . SUI (stress urinary incontinence, female) 09/29/2016    Past Surgical History:  Procedure Laterality Date  . COLONOSCOPY  01/01/2005   Normal   . POLYPECTOMY    . UTERINE FIBROID EMBOLIZATION      Current Outpatient Medications  Medication Sig Dispense Refill  . BIOTIN PO Take by mouth.    . cholecalciferol  (VITAMIN D) 1000 units tablet TAKE 1 TABLET BY MOUTH EVERY DAY 30 tablet 2  . COLLAGEN PO Take by mouth.    . dicyclomine (BENTYL) 20 MG tablet Take 1 tablet (20 mg total) by mouth 3 (three) times daily before meals. 90 tablet 5  . docusate sodium (COLACE) 100 MG capsule Take 1 capsule (100 mg total) by mouth 2 (two) times daily. 60 capsule 11  . FeFum-FePoly-FA-B Cmp-C-Biot (INTEGRA PLUS) CAPS TAKE 1 CAPSULE BY MOUTH EVERY DAY 90 capsule 1  . hydrochlorothiazide (HYDRODIURIL) 25 MG tablet TAKE 1 TABLET BY MOUTH EVERY MORNING ONCE A DAY BY MOUTH 90 tablet 0  . lisinopril (PRINIVIL,ZESTRIL) 10 MG tablet TAKE 1 TABLET BY MOUTH EVERY DAY 90 tablet 1  . metroNIDAZOLE (METROGEL VAGINAL) 0.75 % vaginal gel Place 1 Applicatorful vaginally 2 (two) times daily. 70 g 4  . Multiple Vitamin (MULTIVITAMIN WITH MINERALS) TABS Take 1 tablet by mouth daily. Reported on 07/10/2015    . pantoprazole (PROTONIX) 40 MG tablet Take 1 tablet (40 mg total) by mouth daily. 90 tablet 1  . vitamin B-12 (CYANOCOBALAMIN) 1000 MCG tablet Take 1,000 mcg by mouth daily.     No current facility-administered medications for this visit.     Allergies as of 04/19/2018  . (No Known Allergies)    Family History  Problem Relation Age of Onset  . Hodgkin's lymphoma Mother   .  Alzheimer's disease Father   . Colon cancer Maternal Grandfather   . Stroke Paternal Grandfather   . Breast cancer Sister   . Breast cancer Maternal Aunt   . Esophageal cancer Neg Hx   . Stomach cancer Neg Hx   . Rectal cancer Neg Hx     Social History   Socioeconomic History  . Marital status: Single    Spouse name: Not on file  . Number of children: 0  . Years of education: Not on file  . Highest education level: Not on file  Occupational History  . Occupation: MI service specialist    Employer: [employer]  . Financial resource strain: Not on file  . Food insecurity:    Worry: Not on file    Inability: Not on file  .  Transportation needs:    Medical: Not on file    Non-medical: Not on file  Tobacco Use  . Smoking status: Never Smoker  . Smokeless tobacco: Never Used  Substance and Sexual Activity  . Alcohol use: No    Alcohol/week: 0.0 standard drinks  . Drug use: No  . Sexual activity: Yes    Birth control/protection: Abstinence  Lifestyle  . Physical activity:    Days per week: Not on file    Minutes per session: Not on file  . Stress: Not on file  Relationships  . Social connections:    Talks on phone: Not on file    Gets together: Not on file    Attends religious service: Not on file    Active member of club or organization: Not on file    Attends meetings of clubs or organizations: Not on file    Relationship status: Not on file  . Intimate partner violence:    Fear of current or ex partner: Not on file    Emotionally abused: Not on file    Physically abused: Not on file    Forced sexual activity: Not on file  Other Topics Concern  . Not on file  Social History Narrative  . Not on file    Review of Systems:    Constitutional: No weight loss, fever or chills Cardiovascular: No chest pain Respiratory: No SOB  Gastrointestinal: See HPI and otherwise negative   Physical Exam:  Vital signs: BP 110/66   Pulse 72   Ht 5\' 7"  (1.702 m)   Wt 172 lb (78 kg)   BMI 26.94 kg/m   Constitutional:   Pleasant AA female appears to be in NAD, Well developed, Well nourished, alert and cooperative Respiratory: Respirations even and unlabored. Lungs clear to auscultation bilaterally.   No wheezes, crackles, or rhonchi.  Cardiovascular: Normal S1, S2. No MRG. Regular rate and rhythm. No peripheral edema, cyanosis or pallor.  Gastrointestinal:  Soft, nondistended, nontender. No rebound or guarding. Normal bowel sounds. No appreciable masses or hepatomegaly. Rectal:  Not performed.  Psychiatric: Demonstrates good judgement and reason without abnormal affect or behaviors.  MOST RECENT LABS AND  IMAGING: CBC    Component Value Date/Time   WBC 5.0 11/23/2017 1131   RBC 4.52 11/23/2017 1131   HGB 13.5 11/23/2017 1131   HGB 13.0 04/30/2013 1105   HCT 40.0 11/23/2017 1131   HCT 40.2 04/30/2013 1105   PLT 188.0 11/23/2017 1131   PLT 175 04/30/2013 1105   MCV 88.5 11/23/2017 1131   MCV 88.3 04/30/2013 1105   MCH 28.6 04/30/2013 1105   MCH 25.2 (L) 11/03/2012 0345   MCHC  33.7 11/23/2017 1131   RDW 13.5 11/23/2017 1131   RDW 15.3 (H) 04/30/2013 1105   LYMPHSABS 2.0 11/23/2017 1131   LYMPHSABS 1.9 04/30/2013 1105   MONOABS 0.4 11/23/2017 1131   MONOABS 0.5 04/30/2013 1105   EOSABS 0.2 11/23/2017 1131   EOSABS 0.1 04/30/2013 1105   BASOSABS 0.0 11/23/2017 1131   BASOSABS 0.0 04/30/2013 1105    CMP     Component Value Date/Time   NA 139 01/27/2018 1112   NA 141 04/30/2013 1105   K 4.1 01/27/2018 1112   K 3.8 04/30/2013 1105   CL 103 01/27/2018 1112   CO2 30 01/27/2018 1112   CO2 26 04/30/2013 1105   GLUCOSE 79 01/27/2018 1112   GLUCOSE 101 04/30/2013 1105   GLUCOSE 86 01/24/2006 1438   BUN 25 (H) 01/27/2018 1112   BUN 16.9 04/30/2013 1105   CREATININE 1.00 01/27/2018 1112   CREATININE 0.9 04/30/2013 1105   CALCIUM 10.0 01/27/2018 1112   CALCIUM 9.8 04/30/2013 1105   PROT 7.5 01/27/2018 1112   PROT 7.9 04/30/2013 1105   ALBUMIN 4.1 01/27/2018 1112   ALBUMIN 3.9 04/30/2013 1105   AST 16 01/27/2018 1112   AST 25 04/30/2013 1105   ALT 13 01/27/2018 1112   ALT 20 04/30/2013 1105   ALKPHOS 62 01/27/2018 1112   ALKPHOS 69 04/30/2013 1105   BILITOT 0.7 01/27/2018 1112   BILITOT 0.88 04/30/2013 1105   GFRNONAA 81 (L) 11/02/2012 0800   GFRNONAA 84 09/27/2012 1456   GFRAA >90 11/02/2012 0800   GFRAA >89 09/27/2012 1456    Assessment: 1.  Fecal incontinence: Relieved after patient started juicing 2.  Constipation: As above 3.  Screening for colorectal cancer: Patient is due for screening colonoscopy  Plan: 1.  Scheduled patient for a screening colonoscopy in  the [place] with Dr. Loletha Carrow.  Did discuss risks, benefits, limitations and alternatives and the patient agrees to proceed. 2.  Encouraged the patient continue juicing if this is helping with her constipation and abdominal symptoms. 3.  Patient to follow in clinic per recommendations from Dr. Loletha Carrow after time of procedure.  Ellouise Newer, PA-C [facility] 04/19/2018, 2:46 PM  Cc: Briscoe Deutscher, DO

## 2018-04-28 DIAGNOSIS — N7689 Other specified inflammation of vagina and vulva: Secondary | ICD-10-CM | POA: Diagnosis not present

## 2018-05-03 ENCOUNTER — Encounter: Payer: Self-pay | Admitting: Gastroenterology

## 2018-05-12 ENCOUNTER — Telehealth: Payer: Self-pay | Admitting: Gastroenterology

## 2018-05-12 ENCOUNTER — Ambulatory Visit (AMBULATORY_SURGERY_CENTER): Payer: BLUE CROSS/BLUE SHIELD | Admitting: Gastroenterology

## 2018-05-12 ENCOUNTER — Encounter: Payer: Self-pay | Admitting: Gastroenterology

## 2018-05-12 VITALS — BP 123/80 | HR 84 | Resp 16 | Ht 67.0 in | Wt 172.0 lb

## 2018-05-12 DIAGNOSIS — Z1211 Encounter for screening for malignant neoplasm of colon: Secondary | ICD-10-CM

## 2018-05-12 MED ORDER — SODIUM CHLORIDE 0.9 % IV SOLN: 500.0000 mL | INTRAVENOUS | Status: DC

## 2018-05-12 NOTE — Patient Instructions (Signed)
Normal colonoscopy Repeat colonoscopy in 10 years   YOU HAD AN ENDOSCOPIC PROCEDURE TODAY AT [place]:   Refer to the procedure report that was given to you for any specific questions about what was found during the examination.  If the procedure report does not answer your questions, please call your gastroenterologist to clarify.  If you requested that your care partner not be given the details of your procedure findings, then the procedure report has been included in a sealed envelope for you to review at your convenience later.  YOU SHOULD EXPECT: Some feelings of bloating in the abdomen. Passage of more gas than usual.  Walking can help get rid of the air that was put into your GI tract during the procedure and reduce the bloating. If you had a lower endoscopy (such as a colonoscopy or flexible sigmoidoscopy) you may notice spotting of blood in your stool or on the toilet paper. If you underwent a bowel prep for your procedure, you may not have a normal bowel movement for a few days.  Please Note:  You might notice some irritation and congestion in your nose or some drainage.  This is from the oxygen used during your procedure.  There is no need for concern and it should clear up in a day or so.  SYMPTOMS TO REPORT IMMEDIATELY:   Following lower endoscopy (colonoscopy or flexible sigmoidoscopy):  Excessive amounts of blood in the stool  Significant tenderness or worsening of abdominal pains  Swelling of the abdomen that is new, acute  Fever of 100F or higher   For urgent or emergent issues, a gastroenterologist can be reached at any hour by calling [phone].   DIET:  We do recommend a small meal at first, but then you may proceed to your regular diet.  Drink plenty of fluids but you should avoid alcoholic beverages for 24 hours.  ACTIVITY:  You should plan to take it easy for the rest of today and you should NOT DRIVE or use heavy machinery until tomorrow  (because of the sedation medicines used during the test).    FOLLOW UP: Our staff will call the number listed on your records the next business day following your procedure to check on you and address any questions or concerns that you may have regarding the information given to you following your procedure. If we do not reach you, we will leave a message.  However, if you are feeling well and you are not experiencing any problems, there is no need to return our call.  We will assume that you have returned to your regular daily activities without incident.  If any biopsies were taken you will be contacted by phone or by letter within the next 1-3 weeks.  Please call us at [phone] if you have not heard about the biopsies in 3 weeks.    SIGNATURES/CONFIDENTIALITY: You and/or your care partner have signed paperwork which will be entered into your electronic medical record.  These signatures attest to the fact that that the information above on your After Visit Summary has been reviewed and is understood.  Full responsibility of the confidentiality of this discharge information lies with you and/or your care-partner.

## 2018-05-12 NOTE — Op Note (Signed)
[facility] Patient Name: Erica [name] Procedure Date: 05/12/2018 1:23 PM MRN: [id number] Endoscopist: Sherilyn Cooter L. Myrtie Neither , MD Age: 53 Referring MD:  Date of Birth: [birth year] Gender: Female Account #: [id number] Procedure:                Colonoscopy Indications:              Screening for colorectal malignant neoplasm, This                            is the patient's first colonoscopy Medicines:                Monitored Anesthesia Care Procedure:                Pre-Anesthesia Assessment:                           - Prior to the procedure, a History and Physical                            was performed, and patient medications and                            allergies were reviewed. The patient's tolerance of                            previous anesthesia was also reviewed. The risks                            and benefits of the procedure and the sedation                            options and risks were discussed with the patient.                            All questions were answered, and informed consent                            was obtained. Prior Anticoagulants: The patient has                            taken no previous anticoagulant or antiplatelet                            agents. ASA Grade Assessment: II - A patient with                            mild systemic disease. After reviewing the risks                            and benefits, the patient was deemed in                            satisfactory condition to undergo the procedure.  After obtaining informed consent, the colonoscope                            was passed under direct vision. Throughout the                            procedure, the patient's blood pressure, pulse, and                            oxygen saturations were monitored continuously. The                            Colonoscope was introduced through the anus and                            advanced to the the terminal  ileum, with                            identification of the appendiceal orifice and IC                            valve. The colonoscopy was performed without                            difficulty. The patient tolerated the procedure                            well. The quality of the bowel preparation was                            excellent. The terminal ileum, ileocecal valve,                            appendiceal orifice, and rectum were photographed.                            The quality of the bowel preparation was evaluated                            using the BBPS [facility] Bowel Preparation Scale)                            with scores of: Right Colon = 3, Transverse Colon =                            3 and Left Colon = 3 (entire mucosa seen well with                            no residual staining, small fragments of stool or                            opaque liquid). The total BBPS score equals 9. The  bowel preparation used was Plenvu. Scope In: 1:33:09 PM Scope Out: 1:47:10 PM Scope Withdrawal Time: 0 hours 10 minutes 16 seconds  Total Procedure Duration: 0 hours 14 minutes 1 second  Findings:                 The perianal and digital rectal examinations were                            normal.                           The terminal ileum appeared normal.                           The entire examined colon appeared normal on direct                            and retroflexion views. Complications:            No immediate complications. Estimated Blood Loss:     Estimated blood loss: none. Impression:               - The examined portion of the ileum was normal.                           - The entire examined colon is normal on direct and                            retroflexion views.                           - No specimens collected. Recommendation:           - Patient has a contact number available for                            emergencies. The  signs and symptoms of potential                            delayed complications were discussed with the                            patient. Return to normal activities tomorrow.                            Written discharge instructions were provided to the                            patient.                           - Resume previous diet.                           - Continue present medications.                           - Repeat colonoscopy in 10 years for  screening                            purposes. Hikari Tripp L. Myrtie Neither, MD 05/12/2018 1:52:35 PM This report has been signed electronically.

## 2018-05-12 NOTE — Telephone Encounter (Signed)
PT has a Procedure today 1-31 appt is @ 1:30... Pt advised that she took her suprep and just throw it all up. Wants to know if this is normal

## 2018-05-12 NOTE — Telephone Encounter (Signed)
Spoke with patient. She states she threw up all of her prep this morning, she states when she uses the bathroom results are clear. Left note for Dr. Loletha Carrow to advise.

## 2018-05-12 NOTE — Progress Notes (Signed)
To PACU, VSS. Report to Rn.tb 

## 2018-05-12 NOTE — Telephone Encounter (Signed)
Spoke with Dr. Loletha Carrow and he asked if patient could come in a bit earlier than scheduled so that we may observe output. If not clear, we are to call Dr. Loletha Carrow for orders. Advised pt and she will arrive as close to noon as she can.

## 2018-05-15 ENCOUNTER — Telehealth: Payer: Self-pay

## 2018-05-15 NOTE — Telephone Encounter (Signed)
  Follow up Call-  Call back number 05/12/2018  Post procedure Call Back phone  # [phone]  Permission to leave phone message Yes  Some recent data might be hidden     Patient questions:  Do you have a fever, pain , or abdominal swelling? No. Pain Score  0 *  Have you tolerated food without any problems? Yes.    Have you been able to return to your normal activities? Yes.    Do you have any questions about your discharge instructions: Diet   No. Medications  No. Follow up visit  No.  Do you have questions or concerns about your Care? No.  Actions: * If pain score is 4 or above: No action needed, pain <4.

## 2018-05-28 ENCOUNTER — Other Ambulatory Visit: Payer: Self-pay | Admitting: Family Medicine

## 2018-06-01 ENCOUNTER — Other Ambulatory Visit: Payer: Self-pay | Admitting: Family Medicine

## 2018-06-28 ENCOUNTER — Other Ambulatory Visit: Payer: Self-pay | Admitting: Family Medicine

## 2018-07-04 ENCOUNTER — Other Ambulatory Visit: Payer: Self-pay | Admitting: Family Medicine

## 2018-07-04 ENCOUNTER — Ambulatory Visit: Payer: BLUE CROSS/BLUE SHIELD | Admitting: Physician Assistant

## 2018-07-04 ENCOUNTER — Telehealth: Payer: Self-pay

## 2018-07-04 NOTE — Telephone Encounter (Signed)
See note

## 2018-07-04 NOTE — Telephone Encounter (Signed)
Looks like patient had app with sam today and did not keep. Ok ot fill?

## 2018-07-04 NOTE — Telephone Encounter (Signed)
Copied from [place] [id number]. Topic: Quick Communication - Rx Refill/Question >> Jul 04, 2018  4:09 PM Richardo Priest, NT wrote: Medication:  pantoprazole (PROTONIX) 40 MG tablet  Has the patient contacted their pharmacy? Yes, patient states no longer has refills.  Preferred Pharmacy (with phone number or street name):  [id number] [address], [place]. [phone] (Phone) [phone] (Fax)  Agent: Please be advised that RX refills may take up to 3 business days. We ask that you follow-up with your pharmacy.

## 2018-07-04 NOTE — Telephone Encounter (Signed)
Spoke to patient.  She feels that her symptoms may be reflux related.  She c/o abdominal bloating, gas and constipation.  + h/o IBS.  She reports sx of chest tightness and nasal congestion, intermitent chills and a feeling of achiness/numbness sensation in her L arm.  Denies fever, ST,SOB. (temp is 97.4) She has tried Atrovent, Delsym, Sudafed and Robitussin w/o much relief of her symptoms.    Offered web visit today w/Samantha Morene Rankins, and patient accepted appt today 3/24 @ 2:20.

## 2018-07-05 MED ORDER — PANTOPRAZOLE SODIUM 40 MG PO TBEC: 40.0000 mg | DELAYED_RELEASE_TABLET | Freq: Every day | ORAL | 1 refills | Status: DC

## 2018-07-05 NOTE — Telephone Encounter (Signed)
Rx sent to pharmacy   

## 2018-07-05 NOTE — Telephone Encounter (Signed)
Okay 

## 2018-07-06 ENCOUNTER — Telehealth: Payer: Self-pay | Admitting: Family Medicine

## 2018-07-06 NOTE — Telephone Encounter (Signed)
Patient called and asked is it ok for her to go to work tomorrow, since she's been out sick. She says she was supposed to have a webex with [employer], but her insurance wouldn't pay and she had to use their teledoc service. She says she did 2 visits with teledoc and was told first she had an URI, then the next time was told she has Infectious Gastoenteritis Colitis Unspecified and was prescribed Sucralfate. She says she doesn't have a fever, no cough, no SOB. She says she's still having cold chills to her left arm, feeling bloated and chest discomfort after eating. She says she's no better and would be willing to come in for an OV. I placed patient on hold, called the office and spoke to [place], [place]. I was advised to let the patient know someone will call her back tomorrow morning, because no one was in the office to ask if it's ok to schedule her an OV. I advised the patient and she verbalized understanding.

## 2018-07-07 NOTE — Telephone Encounter (Signed)
See note

## 2018-07-08 NOTE — Telephone Encounter (Signed)
Sent patient message to let her know I would see if we can do office visit but we are limiting all visit to office at this time. Also advised that she contact the provider she was able to meet with to see if they advise her to return to work.

## 2018-07-08 NOTE — Telephone Encounter (Signed)
We can see her in the office with precautions on Monday.

## 2018-07-09 NOTE — Telephone Encounter (Signed)
Questions for Screening COVID-19  Symptom onset: started on 3 weeks ago started with lower back pain   Travel or Contacts: no travel   During this illness, did/does the patient experience any of the following symptoms? Fever >100.72F []   Yes [x]   No []   Unknown  Today 98.6 Subjective fever (felt feverish) []   Yes [x]   No []   Unknown Chills [x]   Yes []   No []   Unknown Muscle aches (myalgia) [x]   Yes []   No []   Unknown Runny nose (rhinorrhea) []   Yes [x]   No []   Unknown Sore throat []   Yes [x]   No []   Unknown Cough (new onset or worsening of chronic cough) []   Yes [x]   No []   Unknown Shortness of breath (dyspnea) []   Yes [x]   No []   Unknown Nausea or vomiting []   Yes [x]   No []   Unknown Headache []   Yes []   No [x]   Unknown Abdominal pain  [x]   Yes []   No []   Unknown just a little of abdominal pain has been constipated Diarrhea (?3 loose/looser than normal stools/24hr period) [x]   Yes []   No []   Unknown after eating out 2 weeks ago for a few days  Other, specify:  Patient risk factors: Smoker? []   Current []   Former [x]   Never If female, currently pregnant? [x]   Yes []   No  Patient Active Problem List   Diagnosis Date Noted  . Osteopenia 11/26/2016  . Vitamin D deficiency 11/26/2016  . Essential hypertension 08/04/2006  . IBS 08/04/2006    Plan:  []   High risk for COVID-19 with red flags go to ED (with CP, SOB, weak/lightheaded, or fever > 101.5). Call ahead.  []   High risk for COVID-19 but stable will have car visit. Inform provider and coordinate time. Will be completed in afternoon. *Consider myChart, telephone, or WebEx visit if available at site. [x]   No red flags but URI signs or symptoms will go through side door and be seen in dedicated room.  Note: Referral to telemedicine is an appropriate alternative disposition for higher risk but stable. [facility]/e-Visit: [phone].

## 2018-07-10 ENCOUNTER — Encounter: Payer: Self-pay | Admitting: Family Medicine

## 2018-07-10 ENCOUNTER — Other Ambulatory Visit: Payer: Self-pay

## 2018-07-10 ENCOUNTER — Ambulatory Visit (INDEPENDENT_AMBULATORY_CARE_PROVIDER_SITE_OTHER): Payer: BLUE CROSS/BLUE SHIELD | Admitting: Family Medicine

## 2018-07-10 ENCOUNTER — Telehealth: Payer: Self-pay | Admitting: Family Medicine

## 2018-07-10 VITALS — BP 136/82 | HR 69 | Temp 98.3°F

## 2018-07-10 DIAGNOSIS — I10 Essential (primary) hypertension: Secondary | ICD-10-CM | POA: Diagnosis not present

## 2018-07-10 DIAGNOSIS — R1013 Epigastric pain: Secondary | ICD-10-CM

## 2018-07-10 DIAGNOSIS — K589 Irritable bowel syndrome without diarrhea: Secondary | ICD-10-CM

## 2018-07-10 DIAGNOSIS — Z9189 Other specified personal risk factors, not elsewhere classified: Secondary | ICD-10-CM | POA: Diagnosis not present

## 2018-07-10 HISTORY — DX: Epigastric pain: R10.13

## 2018-07-10 MED ORDER — ESOMEPRAZOLE MAGNESIUM 40 MG PO CPDR: 40.0000 mg | DELAYED_RELEASE_CAPSULE | Freq: Every day | ORAL | 3 refills | Status: DC

## 2018-07-10 MED ORDER — FAMOTIDINE 10 MG PO TABS: 10.0000 mg | ORAL_TABLET | Freq: Two times a day (BID) | ORAL | 2 refills | Status: DC

## 2018-07-10 NOTE — Telephone Encounter (Signed)
Patient called wanting to know is she suppose to take Pantoprazole and the Esomeprazole together or separate or only take one of those medications.   Please call [phone] to discuss with patient

## 2018-07-10 NOTE — Progress Notes (Signed)
Erica [name] is a 53 y.o. female here for an acute visit.  History of Present Illness:   HPI: Review of previous notes:  Patient called and asked is it ok for her to go to work tomorrow, since she's been out sick. She says she was supposed to have a webex with [employer], but her insurance wouldn't pay and she had to use their teledoc service. She says she did 2 visits with teledoc and was told first she had an URI, then the next time was told she has Infectious Gastoenteritis Colitis Unspecified and was prescribed Sucralfate. She says she doesn't have a fever, no cough, no SOB. She says she's still having cold chills to her left arm, feeling bloated and chest discomfort after eating.   See COVID screening completed by CMA.   PMHx, SurgHx, SocialHx, Medications, and Allergies were reviewed in the Visit Navigator and updated as appropriate.  Current Medications   .  ATROVENT HFA 17 MCG/ACT inhaler, Inhale 1 puff into the lungs 2 (two) times daily., Disp: , Rfl:  .  BIOTIN PO, Take by mouth., Disp: , Rfl:  .  cholecalciferol (VITAMIN D) 1000 units tablet, TAKE 1 TABLET BY MOUTH EVERY DAY, Disp: 30 tablet, Rfl: 2 .  COLLAGEN PO, Take by mouth., Disp: , Rfl:  .  FeFum-FePoly-FA-B Cmp-C-Biot (INTEGRA PLUS) CAPS, TAKE 1 CAPSULE BY MOUTH EVERY DAY, Disp: 90 capsule, Rfl: 0 .  hydrochlorothiazide (HYDRODIURIL) 25 MG tablet, TAKE 1 TABLET BY MOUTH ONCE A DAY EVERY MORNING, Disp: 90 tablet, Rfl: 0 .  lisinopril (PRINIVIL,ZESTRIL) 10 MG tablet, TAKE 1 TABLET BY MOUTH EVERY DAY, Disp: 90 tablet, Rfl: 1 .  pantoprazole (PROTONIX) 40 MG tablet, Take 1 tablet (40 mg total) by mouth daily., Disp: 90 tablet, Rfl: 1 .  vitamin B-12 (CYANOCOBALAMIN) 1000 MCG tablet, Take 1,000 mcg by mouth daily., Disp: , Rfl:    No Known Allergies   Review of Systems   Pertinent items are noted in the HPI. Otherwise, ROS is negative.  Vitals   Vitals:   07/10/18 0756  BP: 136/82  Pulse: 69  Temp: 98.3 F  (36.8 C)  TempSrc: Oral  SpO2: 96%     There is no height or weight on file to calculate BMI.  Physical Exam   Physical Exam Vitals signs and nursing note reviewed.  HENT:     Head: Normocephalic and atraumatic.  Eyes:     Pupils: Pupils are equal, round, and reactive to light.  Neck:     Musculoskeletal: Normal range of motion and neck supple.  Cardiovascular:     Rate and Rhythm: Normal rate and regular rhythm.     Heart sounds: Normal heart sounds.  Pulmonary:     Effort: Pulmonary effort is normal.  Abdominal:     Palpations: Abdomen is soft.  Skin:    General: Skin is warm.  Psychiatric:        Behavior: Behavior normal.    Assessment and Plan   Chantia was seen today for gastroesophageal reflux.  Diagnoses and all orders for this visit:  Dyspepsia Comments: See AVS. Orders: -     famotidine (PEPCID AC) 10 MG tablet; Take 1 tablet (10 mg total) by mouth 2 (two) times daily. -     esomeprazole (NEXIUM) 40 MG capsule; Take 1 capsule (40 mg total) by mouth daily.  Irritable bowel syndrome, unspecified type  Essential hypertension  At risk for spreading communicable disease Comments: Possibly COVID. Self isolate  x 2 weeks. Work note provided.    . Reviewed expectations re: course of current medical issues. . Discussed self-management of symptoms. . Outlined signs and symptoms indicating need for more acute intervention. . Patient verbalized understanding and all questions were answered. Marland Kitchen Health Maintenance issues including appropriate healthy diet, exercise, and smoking avoidance were discussed with patient. . See orders for this visit as documented in the electronic medical record. . Patient received an After Visit Summary.  Briscoe Deutscher, DO [place], [facility] 07/10/2018

## 2018-07-10 NOTE — Patient Instructions (Signed)
Please take Tylenol 1000 mg three times daily to control pain. Pick up the 500 mg dose and take 2.   Add Pepcid AC to your stomach regimen, along with Carafate. I am changing your Protonix to Nexium.

## 2018-07-11 NOTE — Telephone Encounter (Signed)
Called patient reviewed all information with her on what medications she should be taking at this time.

## 2018-07-11 NOTE — Telephone Encounter (Signed)
See note

## 2018-07-11 NOTE — Telephone Encounter (Signed)
Stop the Protonix. Take the Nexium only.

## 2018-07-12 ENCOUNTER — Ambulatory Visit: Payer: Self-pay

## 2018-07-12 ENCOUNTER — Telehealth: Payer: Self-pay | Admitting: Family Medicine

## 2018-07-12 DIAGNOSIS — R1013 Epigastric pain: Secondary | ICD-10-CM

## 2018-07-12 DIAGNOSIS — R140 Abdominal distension (gaseous): Secondary | ICD-10-CM

## 2018-07-12 DIAGNOSIS — K589 Irritable bowel syndrome without diarrhea: Secondary | ICD-10-CM

## 2018-07-12 NOTE — Telephone Encounter (Signed)
Patient called and says since she was seen in the office on Monday, 3/30, she's developed the congestion in her chest, especially after eating. She says she's getting really bloated and wonders if this is something that will have to run it's course or does she need to be on something else. She says she took all of the medications then ate, then felt bloated. She says she's fine as long as she's not eating. She says she's just concerned about it all. I advised I will send this over to Dr. Juleen China and someone will call with her recommendation, patient verbalized understanding.  Reason for Disposition . [1] Caller has NON-URGENT question (includes prescribed medication questions) AND [2] triager unable to answer  Answer Assessment - Initial Assessment Questions 1. MAIN CONCERN OR SYMPTOM:  "What is your main concern right now?" "What question do you have?" "What's the main symptom you're worried about?" (e.g., breathing difficulty, cough, fever. pain)    Feeling congestion in the chest after eating 2. ONSET: "When did the symptoms start?"     3 weeks ago 3. BETTER-SAME-WORSE: "Are you getting better, staying the same, or getting worse compared to how you felt at your last visit to the doctor (most recent medical visit)"?     Felt fine in the office because I didn't eat anything 4. VISIT DATE: "When were you seen?" (Date)     07/10/18 5. VISIT DOCTOR: "What is the name of the doctor taking care of you now?"     Dr. Juleen China 6. VISIT DIAGNOSIS:  "What was the main symptom or problem that you were seen for?" "Were you given a diagnosis?"      Congestion in the chest 7. VISIT MEDICATIONS: "Did the physician order any new medicines for you to use?" If yes: "Have you filled the prescription and started taking the medicine?"     Famotidine and esomeprozole, sucrulfate took at 900 8. NEXT APPOINTMENT: "Have you scheduled a follow-up appointment with your doctor?"     No 9. PAIN: "Is there any pain?" If so,  ask: "How bad is it?"  (Scale 1-10; or mild, moderate, severe)     No 10. FEVER: "Do you have a fever?" If so, ask: "What is it, how was it measured  and when did it start?"      No 11. OTHER SYMPTOMS: "Do you have any other symptoms?"      Bloating  Protocols used: RECENT MEDICAL VISIT FOR ILLNESS FOLLOW-UP CALL-A-AH

## 2018-07-12 NOTE — Telephone Encounter (Signed)
Please advise 

## 2018-07-12 NOTE — Telephone Encounter (Signed)
Copied from [place] [id number]. Topic: General - Other >> Jul 12, 2018  4:27 PM Keene Breath wrote: Reason for CRM: Patient called to ask the nurse where she can find the note that she said she would put in My Chart for the patient's employer.  Patient stated that she looked throughout her My Chart and did not see the note.  Please advise and call patient back as soon as possible.  CB# [phone]

## 2018-07-13 ENCOUNTER — Telehealth: Payer: Self-pay | Admitting: *Deleted

## 2018-07-13 ENCOUNTER — Other Ambulatory Visit: Payer: Self-pay

## 2018-07-13 NOTE — Telephone Encounter (Signed)
See note

## 2018-07-13 NOTE — Telephone Encounter (Signed)
Patient forgot to get her letter for work and she wants to know if the office can fax it to her employer for her so she does not have to come back out. Told patient if she would call office with information - we would be glad to help her with that. She will call back with fax number to send copy of letter to.

## 2018-07-13 NOTE — Telephone Encounter (Signed)
Called patient she will send my chart message and give [place] fax number to send letter.

## 2018-07-14 NOTE — Telephone Encounter (Signed)
Called pt and advised. Referral has been placed.

## 2018-07-14 NOTE — Telephone Encounter (Signed)
Sounds stable. Continue treatment through weekend. Please place urgent referral for virtual visit with GI.

## 2018-07-14 NOTE — Telephone Encounter (Signed)
Please check in with patient today. How is she feeling?

## 2018-07-14 NOTE — Telephone Encounter (Signed)
Pt requesting a call back from [place]. She says that Dr. Juleen China told her to be out of work completely for the next 2 weeks but the work note says that she may also work from home. She has already told her employer that she was advised not to work for the next 2 weeks. She also notes that if she works from home, this will require her to go to the office to pick up the things that she needs. She needs to get some clarification on this because she was under the impression that she should not work at all for 2 weeks and the note that's to be sent to her employer does not corroborate that.

## 2018-07-14 NOTE — Addendum Note (Signed)
Addended by: Jasper Loser on: 07/14/2018 03:42 PM   Modules accepted: Orders

## 2018-07-14 NOTE — Telephone Encounter (Signed)
Spoke with pt, she continues to have congestion and some belching which is worse after eating. She has been drinking the Green Naked drinks and this has helped some with BMs, she has about 1 BM daily since starting to drink these. She drinks water daily, regularly. She feels very gassy and bloated after eating but denies nausea. She has been eating less which helps.   Forwarding to Dr. Juleen China

## 2018-07-15 NOTE — Telephone Encounter (Signed)
If patient has to go to the office in order to be able to work from home, then please change letter to no work x 2 weeks.

## 2018-07-17 NOTE — Telephone Encounter (Signed)
Lm to call office

## 2018-07-18 NOTE — Telephone Encounter (Signed)
Reason for CRM:  Patient called in to return a call from [place]. Requesting call back at [phone]

## 2018-07-19 NOTE — Telephone Encounter (Signed)
Spoke with pt regarding letter. She advised that originally they told her that they would need another letter but she thinks the previous letter will be fine. She will call back if she is advised otherwise by her employer.

## 2018-07-20 ENCOUNTER — Other Ambulatory Visit: Payer: Self-pay | Admitting: Family Medicine

## 2018-07-20 ENCOUNTER — Encounter: Payer: Self-pay | Admitting: Family Medicine

## 2018-07-20 NOTE — Telephone Encounter (Signed)
Last OV 07/10/2018  Last vit D check 11/23/2017 Last refill 12/19/2017 #30/2

## 2018-07-24 ENCOUNTER — Other Ambulatory Visit: Payer: Self-pay

## 2018-07-24 NOTE — Telephone Encounter (Signed)
Regarding work note - Spoke with pt regarding letter. She advised that originally they told her that they would need another letter but she thinks the previous letter will be fine. She will call back if she is advised otherwise by her employer.   Regarding referral -  Pt has been scheduled with Dr. Loletha Carrow 07/25/2018 @10 :30 AM for Zoom virtual visit.

## 2018-07-25 ENCOUNTER — Other Ambulatory Visit: Payer: Self-pay

## 2018-07-25 ENCOUNTER — Encounter: Payer: Self-pay | Admitting: Gastroenterology

## 2018-07-25 ENCOUNTER — Ambulatory Visit (INDEPENDENT_AMBULATORY_CARE_PROVIDER_SITE_OTHER): Payer: BLUE CROSS/BLUE SHIELD | Admitting: Gastroenterology

## 2018-07-25 DIAGNOSIS — R12 Heartburn: Secondary | ICD-10-CM | POA: Diagnosis not present

## 2018-07-25 MED ORDER — SUCRALFATE 1 G PO TABS: 1.0000 g | ORAL_TABLET | Freq: Three times a day (TID) | ORAL | 1 refills | Status: DC

## 2018-07-25 NOTE — Progress Notes (Signed)
This patient contacted our office requesting a physician telemedicine video consultation regarding clinical questions and/or test results.   Participants on the Zoom video conference : myself and patient   The patient consented to phone consultation and was aware that a charge will be placed through their insurance.  I was in my office and the patient was at home   Encounter time:  Total time 23 minutes, with 18 minutes spent with patient on phone/webex    Wilfrid Lund, MD   _____________________________________________________________________________________________               [name] Progress Note  Chief Complaint: Heartburn  Subjective  History:  Saw me in 2018 for decades of IBS-C with prominent bloating and distension.  No improvement prior trials of amitiza, miralax, hyoscyamine [name]). Normal screening colonoscopy with me Jan 2020.   Primary care appointment 3/30  - had had  tele-doc visits (through her work) complaining of cold chills in her left arm, bloating, postprandial chest discomfort. Started fatigue, few loose stools, severe chest congestion,diffuse myalgias for a month. Got carafate tabs thru that provider, has taken all without improvement.  Dr. Juleen China thought possibly COVID, recommended 2 weeks self-isolation with a note to be out of work. Lots of chest congestion.  She was prescribed Nexium and Pepcid.perhaps some improvement in pyrosis and gas.  Was severe chest burning, has slowly subsided. No dysphagia/odynophagia.  ROS: Cardiovascular:  no chest pain Respiratory: no dyspnea  The patient's Past Medical, Family and Social History were reviewed and are on file in the EMR. Past Medical History:  Diagnosis Date  . Anemia   . Depression   . Essential hypertension, benign   . Fibroids   . GERD (gastroesophageal reflux disease)   . HH (hiatus hernia)   . Hx of adenomatous colonic polyps   . IBS (irritable bowel syndrome)   .  Osteopenia 10/2016  . SUI (stress urinary incontinence, female) 09/29/2016    Objective:  Med list reviewed  Current Outpatient Medications:  .  BIOTIN PO, Take by mouth., Disp: , Rfl:  .  cholecalciferol (VITAMIN D) 25 MCG (1000 UT) tablet, TAKE 1 TABLET BY MOUTH EVERY DAY, Disp: 90 tablet, Rfl: 0 .  COLLAGEN PO, Take by mouth., Disp: , Rfl:  .  famotidine (PEPCID AC) 10 MG tablet, Take 1 tablet (10 mg total) by mouth 2 (two) times daily., Disp: 60 tablet, Rfl: 2 .  FeFum-FePoly-FA-B Cmp-C-Biot (INTEGRA PLUS) CAPS, TAKE 1 CAPSULE BY MOUTH EVERY DAY, Disp: 90 capsule, Rfl: 0 .  hydrochlorothiazide (HYDRODIURIL) 25 MG tablet, TAKE 1 TABLET BY MOUTH ONCE A DAY EVERY MORNING, Disp: 90 tablet, Rfl: 0 .  lisinopril (PRINIVIL,ZESTRIL) 10 MG tablet, TAKE 1 TABLET BY MOUTH EVERY DAY, Disp: 90 tablet, Rfl: 1 .  sucralfate (CARAFATE) 1 g tablet, Take 1 tablet (1 g total) by mouth 4 (four) times daily -  with meals and at bedtime., Disp: 60 tablet, Rfl: 1 .  vitamin B-12 (CYANOCOBALAMIN) 1000 MCG tablet, Take 1,000 mcg by mouth daily., Disp: , Rfl:    No exam, virtual visit.  Recent Labs:  None recent   @ASSESSMENTPLANBEGIN @ Assessment: Encounter Diagnosis  Name Primary?  . Heartburn Yes   I agree this sounds like a viral syndrome, probable COVID, from which she is recovering at home.  Digestive symptoms are common with that, and it sounds like she initially had some loose stool that is now resolved.  Her bothersome symptom from a digestive standpoint is lately been  persistent heartburn, which is slowly subsiding.  Carafate would probably been more helpful in liquid form or tablets dissolved in a tablespoon of water.  I advised that she stop the Pepcid, continue Nexium once daily for 2 weeks, and I prescribed her another 2 weeks of Carafate tablets to be dissolved in a tablespoon of liquid 4 times a day for meals and bedtime.  There is a refill on it in case she needs it longer than that.  I  suspect this will all slowly settle down as she recovers from this illness.  Encouraged her to contact us as needed.  Nelida Meuse III

## 2018-08-01 ENCOUNTER — Other Ambulatory Visit: Payer: Self-pay | Admitting: Gastroenterology

## 2018-08-01 NOTE — Telephone Encounter (Signed)
It is not clear why this has come from the pharmacy.  I just sent the Rx on 4/14 wfor 60 tabs with 1 refill.

## 2018-08-02 ENCOUNTER — Other Ambulatory Visit: Payer: Self-pay

## 2018-08-02 ENCOUNTER — Emergency Department (HOSPITAL_COMMUNITY)
Admission: EM | Admit: 2018-08-02 | Discharge: 2018-08-02 | Disposition: A | Discharge status: Home / Self Care | Payer: BLUE CROSS/BLUE SHIELD | Attending: Emergency Medicine | Admitting: Emergency Medicine

## 2018-08-02 ENCOUNTER — Emergency Department (HOSPITAL_COMMUNITY): Payer: BLUE CROSS/BLUE SHIELD

## 2018-08-02 ENCOUNTER — Ambulatory Visit: Payer: Self-pay

## 2018-08-02 ENCOUNTER — Encounter (HOSPITAL_COMMUNITY): Payer: Self-pay

## 2018-08-02 DIAGNOSIS — F329 Major depressive disorder, single episode, unspecified: Secondary | ICD-10-CM | POA: Diagnosis not present

## 2018-08-02 DIAGNOSIS — R0789 Other chest pain: Secondary | ICD-10-CM | POA: Diagnosis not present

## 2018-08-02 DIAGNOSIS — R000 Tachycardia, unspecified: Secondary | ICD-10-CM | POA: Diagnosis not present

## 2018-08-02 DIAGNOSIS — I10 Essential (primary) hypertension: Secondary | ICD-10-CM | POA: Diagnosis not present

## 2018-08-02 DIAGNOSIS — Z79899 Other long term (current) drug therapy: Secondary | ICD-10-CM | POA: Diagnosis not present

## 2018-08-02 DIAGNOSIS — R079 Chest pain, unspecified: Secondary | ICD-10-CM

## 2018-08-02 LAB — CBC WITH DIFFERENTIAL/PLATELET
Abs Immature Granulocytes: 0 10*3/uL (ref 0.00–0.07)
Basophils Absolute: 0 10*3/uL (ref 0.0–0.1)
Basophils Relative: 0 %
Eosinophils Absolute: 0 10*3/uL (ref 0.0–0.5)
Eosinophils Relative: 1 %
HCT: 36 % (ref 36.0–46.0)
Hemoglobin: 12.2 g/dL (ref 12.0–15.0)
Immature Granulocytes: 0 %
Lymphocytes Relative: 43 %
Lymphs Abs: 2.2 10*3/uL (ref 0.7–4.0)
MCH: 29.9 pg (ref 26.0–34.0)
MCHC: 33.9 g/dL (ref 30.0–36.0)
MCV: 88.2 fL (ref 80.0–100.0)
Monocytes Absolute: 0.4 10*3/uL (ref 0.1–1.0)
Monocytes Relative: 7 %
Neutro Abs: 2.4 10*3/uL (ref 1.7–7.7)
Neutrophils Relative %: 49 %
Platelets: 161 10*3/uL (ref 150–400)
RBC: 4.08 MIL/uL (ref 3.87–5.11)
RDW: 12.5 % (ref 11.5–15.5)
WBC: 5 10*3/uL (ref 4.0–10.5)
nRBC: 0 % (ref 0.0–0.2)

## 2018-08-02 LAB — BASIC METABOLIC PANEL
Anion gap: 13 (ref 5–15)
BUN: <5 mg/dL — ABNORMAL LOW (ref 6–20)
CO2: 22 mmol/L (ref 22–32)
Calcium: 9.5 mg/dL (ref 8.9–10.3)
Chloride: 97 mmol/L — ABNORMAL LOW (ref 98–111)
Creatinine, Ser: 0.84 mg/dL (ref 0.44–1.00)
GFR calc Af Amer: >60 mL/min (ref >60)
GFR calc non Af Amer: >60 mL/min (ref >60)
Glucose, Bld: 129 mg/dL — ABNORMAL HIGH (ref 70–99)
Potassium: 3.3 mmol/L — ABNORMAL LOW (ref 3.5–5.1)
Sodium: 132 mmol/L — ABNORMAL LOW (ref 135–145)

## 2018-08-02 LAB — TROPONIN I: Troponin I: <0.03 ng/mL (ref <0.03)

## 2018-08-02 MED ORDER — DICYCLOMINE HCL 20 MG PO TABS: 20.0000 mg | ORAL_TABLET | Freq: Four times a day (QID) | ORAL | 0 refills | Status: DC | PRN

## 2018-08-02 NOTE — Telephone Encounter (Signed)
Pt called and said that she was Dx with the COVID-19 in March (3/9) visit with Dr Juleen China.  She states that her symptoms had finally started to leave and she walked to her mailbox today and experienced severe sneezing and chills.  She states that it started her pain back in her right side and her right arm. She took her temperature but has no fever but remains chilled. Pt was encouraged to remain quarantined and continue to treat symptoms as before. She will take her temperature at least 2 times tonight. Care advice read to patient.  She will call 911 for SOB and chest pain and fever 103. Note will be routed to office for appointment scheduling in the am. Pt is aware.  Reason for Disposition . MILD difficulty breathing (e.g., minimal/no SOB at rest, SOB with walking, pulse <100) . [1] Continuous (nonstop) coughing interferes with work or school AND [2] no improvement using cough treatment per protocol  Answer Assessment - Initial Assessment Questions 1. COVID-19 DIAGNOSIS: "Who made your Coronavirus (COVID-19) diagnosis?" "Was it confirmed by a positive lab test?" If not diagnosed by a HCP, ask "Are there lots of cases (community spread) where you live?" (See public health department website, if unsure)   * MAJOR community spread: high number of cases; numbers of cases are increasing; many people hospitalized.   * MINOR community spread: low number of cases; not increasing; few or no people hospitalized     Confirmed by doctor 2. ONSET: "When did the COVID-19 symptoms start?"      March 9 3. WORST SYMPTOM: "What is your worst symptom?" (e.g., cough, fever, shortness of breath, muscle aches)    chills 4. COUGH: "How bad is the cough?"       No cough just congestion pain to side and  Left arm 5. FEVER: "Do you have a fever?" If so, ask: "What is your temperature, how was it measured, and when did it start?"     No fever 6. RESPIRATORY STATUS: "Describe your breathing?" (e.g., shortness of breath,  wheezing, unable to speak)     no 7. BETTER-SAME-WORSE: "Are you getting better, staying the same or getting worse compared to yesterday?"  If getting worse, ask, "In what way?"     Worse because of chills mild congestion today 8. HIGH RISK DISEASE: "Do you have any chronic medical problems?" (e.g., asthma, heart or lung disease, weak immune system, etc.)    no 9. PREGNANCY: "Is there any chance you are pregnant?" "When was your last menstrual period?"     No 10. OTHER SYMPTOMS: "Do you have any other symptoms?"  (e.g., runny nose, headache, sore throat, loss of smell)      Nose runny after being outside  Protocols used: CORONAVIRUS (COVID-19) DIAGNOSED OR SUSPECTED-A-AH

## 2018-08-02 NOTE — ED Notes (Signed)
All appropriate discharge materials reviewed at length with patient. Time for questions provided. Pt has no other questions at this time and verbalizes understanding of all provided materials.  

## 2018-08-02 NOTE — ED Notes (Addendum)
Patient transported to X-ray 

## 2018-08-02 NOTE — ED Triage Notes (Signed)
Pt feeling chest pain 5X weeks. Pain radiating to left arm and right neck. History of htn, acid reflux, ibs.

## 2018-08-03 ENCOUNTER — Telehealth: Payer: Self-pay | Admitting: Family Medicine

## 2018-08-03 NOTE — Telephone Encounter (Signed)
Called pt and left vm to schedule DOXY

## 2018-08-03 NOTE — Telephone Encounter (Signed)
Left message to return call to our office.  

## 2018-08-03 NOTE — Telephone Encounter (Signed)
Please call and schedule video visit

## 2018-08-03 NOTE — Telephone Encounter (Signed)
[facility] Patient Name: Erica [name] Gender: Female DOB: 1965-11-17  Age: 53 Y 11 M 17 D Return Phone Number: [phone] (Primary) Address:  City/State/Zip: [place]  [address] Client [facility] [place] [employer] Night Physician Briscoe Deutscher- DO Contact Type Call Who Is Calling Patient / Member / Family / Caregiver Call Type Triage / Clinical Relationship To Patient Self Return Phone Number [phone] (Primary) Chief Complaint CHEST PAIN (>=21 years) - pain, pressure, heaviness or tightness Reason for Call Symptomatic / Request for [place] states that they saw DO three weeks ago for congestion. DO advised pt. to quarentine with concern that pt. has COVID19. Pt. reports that they continue to have left arm pain, gas, pain in neck, left arm and chest pain. Pt. has a history of irritable bowel and acid reflux disorder. Translation No Nurse Assessment Nurse: Acey Lav, RN, Estill Bamberg Date/Time (Eastern Time): 08/02/2018 7:11:15 PM Confirm and document reason for call. If symptomatic, describe symptoms. ---Caller states that they saw DO three weeks ago for congestion. DO advised pt. to quarantine with concern that pt. has COVID19. Pt. reports that they continue to have left arm pain, gas, pain in neck, left arm and chest pain. Chest pain is constant. Two nights ago really painful. It comes and goes chest pain to left arm. Has the patient had close contact with a person known or suspected to have the novel coronavirus illness OR traveled / lives in area with major community spread (including international travel) in the last 14 days from the onset of symptoms? * If Asymptomatic, screen for exposure and travel within the last 14 days. ---Yes Does the patient have any new or worsening symptoms?  ---Yes Will a triage be completed? ---Yes Related visit to physician within the last 2 weeks? ---Yes Does the PT have any chronic conditions? (i.e. diabetes, asthma, this includes High risk factors for pregnancy, etc.) ---Yes List chronic conditions. ---Pt. has a history of irritable bowel and acid reflux disorder. HTN Is the patient pregnant or possibly pregnant? (Ask all females between the ages of 57-55) ---No Is this a behavioral health or substance abuse call? ---No PLEASE NOTE:  All timestamps contained within this report are represented as [place] Standard Time. CONFIDENTIALTY NOTICE: This fax transmission is intended only for the addressee.  It contains information that is legally privileged, confidential or otherwise protected from use or disclosure.  If you are not the intended recipient, you are strictly prohibited from reviewing, disclosing, copying using or disseminating any of this information or taking any action in reliance on or regarding this information.  If you have received this fax in error, please notify us immediately by telephone so that we can arrange for its return to [place]. Phone:  [phone], Toll-Free:  [phone], Fax:  [phone] Page: 2 of 2 Call Id: [id number] Guidelines Guideline Title Affirmed Question Affirmed Notes Nurse Date/Time Eilene Ghazi Time) Coronavirus (COVID-19) - Diagnosed or Suspected Chest pain  Acey Lav, RN, Estill Bamberg 08/02/2018 7:12:55 PM Disp. Time Eilene Ghazi Time) Disposition Final User 08/02/2018 7:08:36 PM Send to Urgent Fae Pippin 08/02/2018 7:23:02 PM Called On-Call Provider Acey Lav, RN, Estill Bamberg 08/02/2018 7:25:35 PM Upgraded Outcome Per Physician Acey Lav, RN, Estill Bamberg Reason: Per on call Dr. Sharlet Salina: Go to ED now outcome based on s/s. [facility] ED: caller advised. 08/02/2018 7:22:20 PM Call PCP Now Yes Acey Lav,  RN, Shelly Coss Disagree/Comply Comply Caller Understands Yes PreDisposition Did not know what to do Care  Advice Given Per Guideline CALL PCP NOW: * You need to discuss this with your doctor (or NP/PA). * I'll page the on-call provider now. If you haven't heard from the provider (or me) within 30 minutes, call again. * Feeling dehydrated: Drink extra liquids. If the air in your home is dry, use a humidifier. GENERAL CARE ADVICE FOR COVID-19 SYMPTOMS: CARE ADVICE given per CORONAVIRUS (COVID-19) - DIAGNOSED OR SUSPECTED (Adult) guideline. CALL BACK IF: * Difficulty breathing occurs * You become worse. Referrals REFERRED TO PCP OFFICE Paging DoctorName Phone DateTime Result/Outcome Message Type Notes Pricilla Holm- MD [phone] 08/02/2018 7:23:02 PM Called On Call Provider - Reached Doctor Paged Pricilla Holm- MD 08/02/2018 7:23:16 PM Spoke with On Call - General Message Result based on s/s go to ED now: [facility] ED preferred.

## 2018-08-03 NOTE — Telephone Encounter (Signed)
Per open message pt told to go to ED

## 2018-08-03 NOTE — Telephone Encounter (Signed)
Patient was seen in ED do we need to call for doxy app

## 2018-08-07 ENCOUNTER — Ambulatory Visit: Payer: BLUE CROSS/BLUE SHIELD | Admitting: Family Medicine

## 2018-08-08 ENCOUNTER — Encounter: Payer: Self-pay | Admitting: Family Medicine

## 2018-08-08 ENCOUNTER — Ambulatory Visit (INDEPENDENT_AMBULATORY_CARE_PROVIDER_SITE_OTHER): Payer: BLUE CROSS/BLUE SHIELD | Admitting: Family Medicine

## 2018-08-08 ENCOUNTER — Other Ambulatory Visit: Payer: Self-pay

## 2018-08-08 VITALS — Ht 67.0 in | Wt 166.0 lb

## 2018-08-08 DIAGNOSIS — R1013 Epigastric pain: Secondary | ICD-10-CM | POA: Diagnosis not present

## 2018-08-08 DIAGNOSIS — R140 Abdominal distension (gaseous): Secondary | ICD-10-CM

## 2018-08-08 DIAGNOSIS — R1310 Dysphagia, unspecified: Secondary | ICD-10-CM

## 2018-08-08 DIAGNOSIS — K581 Irritable bowel syndrome with constipation: Secondary | ICD-10-CM | POA: Diagnosis not present

## 2018-08-08 DIAGNOSIS — R1010 Upper abdominal pain, unspecified: Secondary | ICD-10-CM

## 2018-08-08 MED ORDER — ESOMEPRAZOLE MAGNESIUM 40 MG PO CPDR: 40.0000 mg | DELAYED_RELEASE_CAPSULE | Freq: Every day | ORAL | 3 refills | Status: DC

## 2018-08-08 NOTE — ED Provider Notes (Signed)
[facility] EMERGENCY DEPARTMENT Provider Note   CSN: [id number] Arrival date & time: 08/02/18  2016    History   Chief Complaint Chief Complaint  Patient presents with  . Chest Pain    HPI Erica [name] is a 53 y.o. female.     HPI   53 year old female with chest pain.  Onset about 4 to 5 weeks ago.  Symptoms have waxed and waned.  Sometimes goes to her neck and her left shoulder.  She has had these symptoms both at rest and with exertion.  No clear exacerbating relieving factors.  She has a past history of acid reflux and IBS.  She states that she really would not be too concerned about her symptoms aside from her now feeling the pain in her neck which concerned her.  Denies any other associated symptoms such as dyspnea, palpitations or diaphoresis.  No known CAD that she is aware of.  Past Medical History:  Diagnosis Date  . Anemia   . Depression   . Essential hypertension, benign   . Fibroids   . GERD (gastroesophageal reflux disease)   . HH (hiatus hernia)   . Hx of adenomatous colonic polyps   . IBS (irritable bowel syndrome)   . Osteopenia 10/2016  . SUI (stress urinary incontinence, female) 09/29/2016    Patient Active Problem List   Diagnosis Date Noted  . Dyspepsia 07/10/2018  . Osteopenia 11/26/2016  . Vitamin D deficiency 11/26/2016  . Essential hypertension 08/04/2006  . IBS 08/04/2006    Past Surgical History:  Procedure Laterality Date  . COLONOSCOPY  01/01/2005   Normal   . POLYPECTOMY    . UTERINE FIBROID EMBOLIZATION       OB History   No obstetric history on file.      Home Medications    Prior to Admission medications   Medication Sig Start Date End Date Taking? Authorizing Provider  BIOTIN PO Take by mouth.    [provider]  cholecalciferol (VITAMIN D) 25 MCG (1000 UT) tablet TAKE 1 TABLET BY MOUTH EVERY DAY 07/20/18   Briscoe Deutscher, DO  COLLAGEN PO Take by mouth.    [provider]   dicyclomine (BENTYL) 20 MG tablet Take 1 tablet (20 mg total) by mouth every 6 (six) hours as needed for spasms. 08/02/18   Virgel Manifold, MD  famotidine (PEPCID AC) 10 MG tablet Take 1 tablet (10 mg total) by mouth 2 (two) times daily. 07/10/18   Briscoe Deutscher, DO  FeFum-FePoly-FA-B Cmp-C-Biot (INTEGRA PLUS) CAPS TAKE 1 CAPSULE BY MOUTH EVERY DAY 06/01/18   Briscoe Deutscher, DO  hydrochlorothiazide (HYDRODIURIL) 25 MG tablet TAKE 1 TABLET BY MOUTH ONCE A DAY EVERY MORNING 05/29/18   Briscoe Deutscher, DO  lisinopril (PRINIVIL,ZESTRIL) 10 MG tablet TAKE 1 TABLET BY MOUTH EVERY DAY 06/28/18   Briscoe Deutscher, DO  sucralfate (CARAFATE) 1 g tablet Take 1 tablet (1 g total) by mouth 4 (four) times daily -  with meals and at bedtime. 07/25/18   Doran Stabler, MD  vitamin B-12 (CYANOCOBALAMIN) 1000 MCG tablet Take 1,000 mcg by mouth daily.    [provider]    Family History Family History  Problem Relation Age of Onset  . Hodgkin's lymphoma Mother   . Alzheimer's disease Father   . Colon cancer Maternal Grandfather   . Stroke Paternal Grandfather   . Breast cancer Sister   . Breast cancer Maternal Aunt   . Esophageal cancer Neg Hx   .  Stomach cancer Neg Hx   . Rectal cancer Neg Hx     Social History Social History   Tobacco Use  . Smoking status: Never Smoker  . Smokeless tobacco: Never Used  Substance Use Topics  . Alcohol use: No    Alcohol/week: 0.0 standard drinks    Comment: occ  . Drug use: No     Allergies   Patient has no known allergies.   Review of Systems Review of Systems  All systems reviewed and negative, other than as noted in HPI.  Physical Exam Updated Vital Signs BP (!) 131/103 (BP Location: Right Arm) Comment: Simultaneous filing. User may not have seen previous data.  Pulse 70 Comment: Simultaneous filing. User may not have seen previous data.  Temp 98.3 F (36.8 C) (Oral)   Resp 10 Comment: Simultaneous filing. User may not have seen previous  data.  Ht 5\' 7"  (1.702 m)   Wt 75.3 kg   SpO2 100% Comment: Simultaneous filing. User may not have seen previous data.  BMI 26.00 kg/m   Physical Exam Vitals signs and nursing note reviewed.  Constitutional:      General: She is not in acute distress.    Appearance: She is well-developed.  HENT:     Head: Normocephalic and atraumatic.  Eyes:     General:        Right eye: No discharge.        Left eye: No discharge.     Conjunctiva/sclera: Conjunctivae normal.  Neck:     Musculoskeletal: Neck supple.  Cardiovascular:     Rate and Rhythm: Normal rate and regular rhythm.     Heart sounds: Normal heart sounds. No murmur. No friction rub. No gallop.   Pulmonary:     Effort: Pulmonary effort is normal. No respiratory distress.     Breath sounds: Normal breath sounds.  Abdominal:     General: There is no distension.     Palpations: Abdomen is soft.     Tenderness: There is no abdominal tenderness.  Musculoskeletal:        General: No tenderness.  Skin:    General: Skin is warm and dry.  Neurological:     Mental Status: She is alert.  Psychiatric:        Behavior: Behavior normal.        Thought Content: Thought content normal.      ED Treatments / Results  Labs (all labs ordered are listed, but only abnormal results are displayed) Labs Reviewed  BASIC METABOLIC PANEL - Abnormal; Notable for the following components:      Result Value   Sodium 132 (*)    Potassium 3.3 (*)    Chloride 97 (*)    Glucose, Bld 129 (*)    BUN <5 (*)    All other components within normal limits  CBC WITH DIFFERENTIAL/PLATELET  TROPONIN I    EKG EKG Interpretation  Date/Time:  Wednesday August 02 2018 20:26:00 EDT Ventricular Rate:  100 PR Interval:    QRS Duration: 86 QT Interval:  365 QTC Calculation: 471 R Axis:   65 Text Interpretation:  Sinus tachycardia Borderline low voltage, extremity leads Abnormal ekg Confirmed by Carmin Muskrat [phone]) on 08/02/2018 8:29:05 PM    Radiology No results found.   Dg Chest 2 View  Result Date: 08/02/2018 CLINICAL DATA:  Chest pain EXAM: CHEST - 2 VIEW COMPARISON:  08/25/2017 FINDINGS: The heart size and mediastinal contours are within normal limits. Both lungs are clear.  The visualized skeletal structures are unremarkable. IMPRESSION: No active cardiopulmonary disease. Electronically Signed   By: Ulyses Jarred M.D.   On: 08/02/2018 21:37    Procedures Procedures (including critical care time)  Medications Ordered in ED Medications - No data to display   Initial Impression / Assessment and Plan / ED Course  I have reviewed the triage vital signs and the nursing notes.  Pertinent labs & imaging results that were available during my care of the patient were reviewed by me and considered in my medical decision making (see chart for details).       53 year old female with chest pain.  Sounds atypical for ACS.  Doubt PE, dissection or other emergent process.  Given the duration of her symptoms, I feel that the next step would be to obtain stress testing.  I feel she is low enough risk for this to be done in the outpatient setting.  Return precautions were discussed.  Outpatient follow-up otherwise.   Final Clinical Impressions(s) / ED Diagnoses   Final diagnoses:  Chest pain, unspecified type    ED Discharge Orders         Ordered    dicyclomine (BENTYL) 20 MG tablet  Every 6 hours PRN,   Status:  Discontinued     08/02/18 2253    dicyclomine (BENTYL) 20 MG tablet  Every 6 hours PRN     08/02/18 2253           Virgel Manifold, MD 08/08/18 [phone]

## 2018-08-08 NOTE — Progress Notes (Signed)
Virtual Visit via Video   Due to the COVID-19 pandemic, this visit was completed with telemedicine (audio/video) technology to reduce patient and provider exposure as well as to preserve personal protective equipment.   I connected with Erica [name] by a video enabled telemedicine application and verified that I am speaking with the correct person using two identifiers. Location patient: Home Location provider: [facility], Office Persons participating in the virtual visit: Erinne Milinda, [place], DO Lonell Grandchild, CMA acting as scribe for Dr. Briscoe Deutscher.   I discussed the limitations of evaluation and management by telemedicine and the availability of in person appointments. The patient expressed understanding and agreed to proceed.  Care Team   Patient Care Team: Briscoe Deutscher, DO as PCP - General (Family Medicine) Shelly Bombard, MD as Consulting Physician (Obstetrics and Gynecology)  Subjective:   HPI:  Patient following up from ED visit to discuss ongoing symptoms. Notes reviewed. Originally seen by virtual MD per work insurance, Dx with URI and then [employer]. Rx Carafate. Presented to me 3/30, Dx with Dyspepsia and IBS-C with concern for possible COVID-19. Rx Nexium and Pepcid.    Patient called 07/12/18, complained of increased congestion in her chest, worse after eating, feeling that her food was not moving down, belching, bloated, gassy. She started drinking Green Naked drinks and BMs 1/day. Stopped eating meat, which helped.    Patient referred to GI. Saw primary GI. Symptoms were improving at that time. Dx probable COVID-19, IBS-C, Heartburn. Pepcid stopped, continued Nexium, Carafate changed to liquid.   Pt called on 4/22. Triaged: "She states that her symptoms had finally started to leave and she walked to her mailbox today and experienced severe sneezing and chills.  She then felt pain in right back back that radiated to her in her right arm. She took her  temperature but has no fever but remains chilled. Pt was encouraged to remain quarantined and continue to treat symptoms as before." She went to the ED with concern about the chest pain.  PER ED VISIT HPI: 53 year old female with chest pain.  Onset about 4 to 5 weeks ago.  Symptoms have waxed and waned. Sometimes goes to her neck and her left shoulder.  She has had these symptoms both at rest and with exertion.  No clear exacerbating relieving factors.  She has a past history of acid reflux and IBS.  She states that she really would not be too concerned about her symptoms aside from her now feeling the pain in her neck which concerned her. Denies any other associated symptoms such as dyspnea, palpitations or diaphoresis.  No known CAD that she is aware of. A/P: 53 year old female with chest pain.  Sounds atypical for ACS.  Doubt PE, dissection or other emergent process. Given the duration of her symptoms, I feel that the next step would be to obtain stress testing.  I feel she is low enough risk for this to be done in the outpatient setting.  Return precautions were discussed. Outpatient follow-up otherwise.  Dg Chest 2 View  Result Date: 08/02/2018 CLINICAL DATA:  Chest pain EXAM: CHEST - 2 VIEW COMPARISON:  08/25/2017 FINDINGS: The heart size and mediastinal contours are within normal limits. Both lungs are clear. The visualized skeletal structures are unremarkable. IMPRESSION: No active cardiopulmonary disease. Electronically Signed   By: Ulyses Jarred M.D.   On: 08/02/2018 21:37   Results for orders placed or performed during the hospital encounter of 08/02/18  CBC with  Differential  Result Value Ref Range   WBC 5.0 4.0 - 10.5 K/uL   RBC 4.08 3.87 - 5.11 MIL/uL   Hemoglobin 12.2 12.0 - 15.0 g/dL   HCT 36.0 36.0 - 46.0 %   MCV 88.2 80.0 - 100.0 fL   MCH 29.9 26.0 - 34.0 pg   MCHC 33.9 30.0 - 36.0 g/dL   RDW 12.5 11.5 - 15.5 %   Platelets 161 150 - 400 K/uL   nRBC 0.0 0.0 - 0.2 %    Neutrophils Relative % 49 %   Neutro Abs 2.4 1.7 - 7.7 K/uL   Lymphocytes Relative 43 %   Lymphs Abs 2.2 0.7 - 4.0 K/uL   Monocytes Relative 7 %   Monocytes Absolute 0.4 0.1 - 1.0 K/uL   Eosinophils Relative 1 %   Eosinophils Absolute 0.0 0.0 - 0.5 K/uL   Basophils Relative 0 %   Basophils Absolute 0.0 0.0 - 0.1 K/uL   Immature Granulocytes 0 %   Abs Immature Granulocytes 0.00 0.00 - 0.07 K/uL  Basic metabolic panel  Result Value Ref Range   Sodium 132 (L) 135 - 145 mmol/L   Potassium 3.3 (L) 3.5 - 5.1 mmol/L   Chloride 97 (L) 98 - 111 mmol/L   CO2 22 22 - 32 mmol/L   Glucose, Bld 129 (H) 70 - 99 mg/dL   BUN <5 (L) 6 - 20 mg/dL   Creatinine, Ser 0.84 0.44 - 1.00 mg/dL   Calcium 9.5 8.9 - 10.3 mg/dL   GFR calc non Af Amer >60 >60 mL/min   GFR calc Af Amer >60 >60 mL/min   Anion gap 13 5 - 15  Troponin I - ONCE - STAT  Result Value Ref Range   Troponin I <0.03 <0.03 ng/mL   Today, she c/o a feeling of something stuck in her throat. She has postprandial bloating, upper abdominal and chest discomfort. Better with holding food intake, esp meats. Worse after meals. No SOB, edema, exertional CP, heartburn, diarrhea.   Review of Systems  Constitutional: Negative for chills, fever, malaise/fatigue and weight loss.  Respiratory: Negative for cough, shortness of breath and wheezing.   Cardiovascular: Negative for chest pain, palpitations and leg swelling.  Gastrointestinal: Negative for abdominal pain, constipation, diarrhea, nausea and vomiting.  Genitourinary: Negative for dysuria and urgency.  Musculoskeletal: Negative for joint pain and myalgias.  Skin: Negative for rash.  Neurological: Negative for dizziness and headaches.  Psychiatric/Behavioral: Negative for depression, substance abuse and suicidal ideas. The patient is not nervous/anxious.     Patient Active Problem List   Diagnosis Date Noted  . Dyspepsia 07/10/2018  . Osteopenia 11/26/2016  . Vitamin D deficiency  11/26/2016  . Essential hypertension 08/04/2006  . IBS 08/04/2006    Social History   Tobacco Use  . Smoking status: Never Smoker  . Smokeless tobacco: Never Used  Substance Use Topics  . Alcohol use: No    Alcohol/week: 0.0 standard drinks    Comment: occ    Current Outpatient Medications:  .  BIOTIN PO, Take by mouth., Disp: , Rfl:  .  cholecalciferol (VITAMIN D) 25 MCG (1000 UT) tablet, TAKE 1 TABLET BY MOUTH EVERY DAY, Disp: 90 tablet, Rfl: 0 .  COLLAGEN PO, Take by mouth., Disp: , Rfl:  .  famotidine (PEPCID AC) 10 MG tablet, Take 1 tablet (10 mg total) by mouth 2 (two) times daily., Disp: 60 tablet, Rfl: 2 .  FeFum-FePoly-FA-B Cmp-C-Biot (INTEGRA PLUS) CAPS, TAKE 1 CAPSULE BY MOUTH  EVERY DAY, Disp: 90 capsule, Rfl: 0 .  hydrochlorothiazide (HYDRODIURIL) 25 MG tablet, TAKE 1 TABLET BY MOUTH ONCE A DAY EVERY MORNING, Disp: 90 tablet, Rfl: 0 .  lisinopril (PRINIVIL,ZESTRIL) 10 MG tablet, TAKE 1 TABLET BY MOUTH EVERY DAY, Disp: 90 tablet, Rfl: 1 .  sucralfate (CARAFATE) 1 g tablet, Take 1 tablet (1 g total) by mouth 4 (four) times daily -  with meals and at bedtime., Disp: 60 tablet, Rfl: 1 .  vitamin B-12 (CYANOCOBALAMIN) 1000 MCG tablet, Take 1,000 mcg by mouth daily., Disp: , Rfl:  .  esomeprazole (NEXIUM) 40 MG capsule, Take 1 capsule (40 mg total) by mouth daily., Disp: 30 capsule, Rfl: 3  No Known Allergies  Objective:   VITALS: Per patient if applicable, see vitals. GENERAL: Alert, appears well and in no acute distress. HEENT: Atraumatic, conjunctiva clear, no obvious abnormalities on inspection of external nose and ears. NECK: Normal movements of the head and neck. CARDIOPULMONARY: No increased WOB. Speaking in clear sentences. I:E ratio WNL.  MS: Moves all visible extremities without noticeable abnormality. PSYCH: Pleasant and cooperative, well-groomed. Speech normal rate and rhythm. Affect is appropriate. Insight and judgement are appropriate. Attention is focused,  linear, and appropriate.  NEURO: CN grossly intact. Oriented as arrived to appointment on time with no prompting. Moves both UE equally.  SKIN: No obvious lesions, wounds, erythema, or cyanosis noted on face or hands.  Depression screen PHQ 2/9 11/26/2016  Decreased Interest 0  Down, Depressed, Hopeless 0  PHQ - 2 Score 0   Assessment and Plan:   Erica [name] was seen today for follow-up.  Diagnoses and all orders for this visit:  1. Postprandial abdominal bloating   2. Dyspepsia   3. Irritable bowel syndrome with constipation   4. Dysphagia, unspecified type   5. Upper abdominal pain    Symptoms worsening. Will ask GI to recheck with new and worsening symptoms. Will see if I can order an UGI in the meantime.   Marland Kitchen COVID-19 Education: The signs and symptoms of COVID-19 were discussed with the patient and how to seek care for testing if needed. The importance of social distancing was discussed today. . Reviewed expectations re: course of current medical issues. . Discussed self-management of symptoms. . Outlined signs and symptoms indicating need for more acute intervention. . Patient verbalized understanding and all questions were answered. Marland Kitchen Health Maintenance issues including appropriate healthy diet, exercise, and smoking avoidance were discussed with patient. . See orders for this visit as documented in the electronic medical record.  Briscoe Deutscher, DO  Records requested if needed. Time spent: 25 minutes, of which >50% was spent in obtaining information about her symptoms, reviewing her previous labs, evaluations, and treatments, counseling her about her condition (please see the discussed topics above), and developing a plan to further investigate it; she had a number of questions which I addressed.

## 2018-08-10 ENCOUNTER — Emergency Department (HOSPITAL_COMMUNITY)
Admission: EM | Admit: 2018-08-10 | Discharge: 2018-08-10 | Disposition: A | Discharge status: Home / Self Care | Payer: BLUE CROSS/BLUE SHIELD | Attending: Emergency Medicine | Admitting: Emergency Medicine

## 2018-08-10 ENCOUNTER — Encounter (HOSPITAL_COMMUNITY): Payer: Self-pay | Admitting: Emergency Medicine

## 2018-08-10 ENCOUNTER — Encounter: Payer: Self-pay | Admitting: Family Medicine

## 2018-08-10 ENCOUNTER — Other Ambulatory Visit: Payer: Self-pay

## 2018-08-10 ENCOUNTER — Emergency Department (HOSPITAL_COMMUNITY): Payer: BLUE CROSS/BLUE SHIELD

## 2018-08-10 DIAGNOSIS — R102 Pelvic and perineal pain: Secondary | ICD-10-CM | POA: Diagnosis not present

## 2018-08-10 DIAGNOSIS — J029 Acute pharyngitis, unspecified: Secondary | ICD-10-CM | POA: Diagnosis not present

## 2018-08-10 DIAGNOSIS — R0789 Other chest pain: Secondary | ICD-10-CM | POA: Diagnosis not present

## 2018-08-10 DIAGNOSIS — Z79899 Other long term (current) drug therapy: Secondary | ICD-10-CM | POA: Diagnosis not present

## 2018-08-10 DIAGNOSIS — R079 Chest pain, unspecified: Secondary | ICD-10-CM | POA: Diagnosis not present

## 2018-08-10 LAB — COMPREHENSIVE METABOLIC PANEL
ALT: 23 U/L (ref 0–44)
AST: 24 U/L (ref 15–41)
Albumin: 4.2 g/dL (ref 3.5–5.0)
Alkaline Phosphatase: 62 U/L (ref 38–126)
Anion gap: 13 (ref 5–15)
BUN: 12 mg/dL (ref 6–20)
CO2: 26 mmol/L (ref 22–32)
Calcium: 10 mg/dL (ref 8.9–10.3)
Chloride: 100 mmol/L (ref 98–111)
Creatinine, Ser: 0.94 mg/dL (ref 0.44–1.00)
GFR calc Af Amer: >60 mL/min (ref >60)
GFR calc non Af Amer: >60 mL/min (ref >60)
Glucose, Bld: 93 mg/dL (ref 70–99)
Potassium: 3.6 mmol/L (ref 3.5–5.1)
Sodium: 139 mmol/L (ref 135–145)
Total Bilirubin: 0.4 mg/dL (ref 0.3–1.2)
Total Protein: 7.5 g/dL (ref 6.5–8.1)

## 2018-08-10 LAB — CBC WITH DIFFERENTIAL/PLATELET
Abs Immature Granulocytes: 0.01 10*3/uL (ref 0.00–0.07)
Basophils Absolute: 0 10*3/uL (ref 0.0–0.1)
Basophils Relative: 0 %
Eosinophils Absolute: 0.1 10*3/uL (ref 0.0–0.5)
Eosinophils Relative: 1 %
HCT: 39.1 % (ref 36.0–46.0)
Hemoglobin: 13.1 g/dL (ref 12.0–15.0)
Immature Granulocytes: 0 %
Lymphocytes Relative: 48 %
Lymphs Abs: 2.4 10*3/uL (ref 0.7–4.0)
MCH: 29.8 pg (ref 26.0–34.0)
MCHC: 33.5 g/dL (ref 30.0–36.0)
MCV: 89.1 fL (ref 80.0–100.0)
Monocytes Absolute: 0.3 10*3/uL (ref 0.1–1.0)
Monocytes Relative: 6 %
Neutro Abs: 2.3 10*3/uL (ref 1.7–7.7)
Neutrophils Relative %: 45 %
Platelets: 187 10*3/uL (ref 150–400)
RBC: 4.39 MIL/uL (ref 3.87–5.11)
RDW: 12.7 % (ref 11.5–15.5)
WBC: 5.1 10*3/uL (ref 4.0–10.5)
nRBC: 0 % (ref 0.0–0.2)

## 2018-08-10 LAB — I-STAT BETA HCG BLOOD, ED (MC, WL, AP ONLY): I-stat hCG, quantitative: <5 m[IU]/mL (ref <5)

## 2018-08-10 LAB — TROPONIN I: Troponin I: <0.03 ng/mL (ref <0.03)

## 2018-08-10 LAB — LIPASE, BLOOD: Lipase: 37 U/L (ref 11–51)

## 2018-08-10 MED ORDER — LIDOCAINE VISCOUS HCL 2 % MT SOLN
15.0000 mL | Freq: Once | OROMUCOSAL | Status: AC
  Administered 2018-08-10: 15 mL via ORAL
  Filled 2018-08-10: qty 15

## 2018-08-10 MED ORDER — ALUM & MAG HYDROXIDE-SIMETH 200-200-20 MG/5ML PO SUSP
30.0000 mL | Freq: Once | ORAL | Status: AC
  Administered 2018-08-10: 30 mL via ORAL
  Filled 2018-08-10: qty 30

## 2018-08-10 NOTE — ED Triage Notes (Signed)
Pt was seen here on 4/22 and had follow up with pcp 2 days ago for same sxs just not any better. Pt reports cp with pain in her throat- pt states it feels like her throat is swelling. Airway intact, clear speech. No acute distress.

## 2018-08-10 NOTE — ED Provider Notes (Signed)
[facility] EMERGENCY DEPARTMENT Provider Note   CSN: [id number] Arrival date & time: 08/10/18  1547    History   Chief Complaint Chief Complaint  Patient presents with  . Sore Throat  . Chest Pain    HPI Erica [name] is a 53 y.o. female with a PMH of GERD, HTN, Depression, and IBS presenting with intermittent right sided chest pain that radiates to the left aspect of chest onset at the beginning of March. Patient reports she has been evaluated by PCP and ER last week. Patient states pain is worse with food especially meats. Patient describes pain as a burning and soreness. Patient denies nausea, vomiting, or diarrhea. Patient reports last BM was this morning. Patient states she has been taking Gas X and Carafate with partial relief. Patient states she stopped taking her PPI 2 days ago because she thought it was not helping her symptoms. Patient reports intermittent burning in her throat. Patient reports intermittent epigastric abdominal pain that she describes as a burning. Patient denies abdominal pain currently. Patient denies shortness of breath, difficulty swallowing secretions, or voice changes. Patient denies allergies, rash, or facial edema. Patient states she sees [facility]. Patient denies a personal history of heart problems. Patient denies tobacco, alcohol, or drug use. Patient denies fever, chills, cough, congestion, or rhinorrhea. Patient reports mother had a heart attack at age 61. Patient denies using OCPs, recent travel, recent surgery, leg edema/pain, or hx of DVT/PE.      HPI  Past Medical History:  Diagnosis Date  . Anemia   . Depression   . Essential hypertension, benign   . Fibroids   . GERD (gastroesophageal reflux disease)   . HH (hiatus hernia)   . Hx of adenomatous colonic polyps   . IBS (irritable bowel syndrome)   . Osteopenia 10/2016  . SUI (stress urinary incontinence, female) 09/29/2016    Patient Active Problem List   Diagnosis  Date Noted  . Dyspepsia 07/10/2018  . Osteopenia 11/26/2016  . Vitamin D deficiency 11/26/2016  . Essential hypertension 08/04/2006  . IBS 08/04/2006    Past Surgical History:  Procedure Laterality Date  . COLONOSCOPY  01/01/2005   Normal   . POLYPECTOMY    . UTERINE FIBROID EMBOLIZATION       OB History   No obstetric history on file.      Home Medications    Prior to Admission medications   Medication Sig Start Date End Date Taking? Authorizing Provider  BIOTIN PO Take 1 tablet by mouth daily.    Yes [provider]  cholecalciferol (VITAMIN D) 25 MCG (1000 UT) tablet TAKE 1 TABLET BY MOUTH EVERY DAY Patient taking differently: Take 1,000 Units by mouth daily.  07/20/18  Yes Briscoe Deutscher, DO  COLLAGEN PO Take 1 tablet by mouth daily.    Yes [provider]  esomeprazole (NEXIUM) 40 MG capsule Take 1 capsule (40 mg total) by mouth daily. 08/08/18  Yes Briscoe Deutscher, DO  FeFum-FePoly-FA-B Cmp-C-Biot (INTEGRA PLUS) CAPS TAKE 1 CAPSULE BY MOUTH EVERY DAY Patient taking differently: Take 1 capsule by mouth daily.  06/01/18  Yes Briscoe Deutscher, DO  hydrochlorothiazide (HYDRODIURIL) 25 MG tablet TAKE 1 TABLET BY MOUTH ONCE A DAY EVERY MORNING Patient taking differently: Take 25 mg by mouth daily.  05/29/18  Yes Briscoe Deutscher, DO  lisinopril (PRINIVIL,ZESTRIL) 10 MG tablet TAKE 1 TABLET BY MOUTH EVERY DAY Patient taking differently: Take 10 mg by mouth daily.  06/28/18  Yes  Briscoe Deutscher, DO  sucralfate (CARAFATE) 1 g tablet Take 1 tablet (1 g total) by mouth 4 (four) times daily -  with meals and at bedtime. 07/25/18  Yes Danis, Kirke Corin, MD  vitamin B-12 (CYANOCOBALAMIN) 1000 MCG tablet Take 1,000 mcg by mouth daily.   Yes [provider]  famotidine (PEPCID AC) 10 MG tablet Take 1 tablet (10 mg total) by mouth 2 (two) times daily. Patient not taking: Reported on 08/10/2018 07/10/18   Briscoe Deutscher, DO    Family History Family History  Problem  Relation Age of Onset  . Hodgkin's lymphoma Mother   . Alzheimer's disease Father   . Colon cancer Maternal Grandfather   . Stroke Paternal Grandfather   . Breast cancer Sister   . Breast cancer Maternal Aunt   . Esophageal cancer Neg Hx   . Stomach cancer Neg Hx   . Rectal cancer Neg Hx     Social History Social History   Tobacco Use  . Smoking status: Never Smoker  . Smokeless tobacco: Never Used  Substance Use Topics  . Alcohol use: No    Alcohol/week: 0.0 standard drinks    Comment: occ  . Drug use: No     Allergies   Patient has no known allergies.   Review of Systems Review of Systems  Constitutional: Negative for chills, diaphoresis and fever.  HENT: Positive for sore throat. Negative for congestion, dental problem, facial swelling, rhinorrhea, trouble swallowing and voice change.   Respiratory: Negative for cough and shortness of breath.   Cardiovascular: Positive for chest pain. Negative for palpitations and leg swelling.  Gastrointestinal: Positive for abdominal pain. Negative for constipation, diarrhea, nausea and vomiting.  Endocrine: Negative for cold intolerance and heat intolerance.  Genitourinary: Negative for dysuria.  Musculoskeletal: Negative for back pain.  Skin: Negative for rash.  Allergic/Immunologic: Negative for immunocompromised state.  Hematological: Negative for adenopathy.     Physical Exam Updated Vital Signs BP (!) 153/96 (BP Location: Left Arm)   Pulse 71   Temp (!) 97.5 F (36.4 C) (Oral)   Resp 18   Ht 5\' 7"  (1.702 m)   Wt 75.3 kg   SpO2 100%   BMI 26.00 kg/m   Physical Exam Vitals signs and nursing note reviewed.  Constitutional:      General: She is not in acute distress.    Appearance: She is well-developed. She is not diaphoretic.  HENT:     Head: Normocephalic and atraumatic.     Right Ear: Tympanic membrane and ear canal normal.     Left Ear: Tympanic membrane and ear canal normal.     Nose: No congestion or  rhinorrhea.     Mouth/Throat:     Mouth: Mucous membranes are moist.     Pharynx: Uvula midline. No pharyngeal swelling, posterior oropharyngeal erythema or uvula swelling.  Neck:     Musculoskeletal: Normal range of motion.  Cardiovascular:     Rate and Rhythm: Normal rate and regular rhythm.     Heart sounds: Normal heart sounds. No murmur. No friction rub. No gallop.   Pulmonary:     Effort: Pulmonary effort is normal. No respiratory distress.     Breath sounds: Normal breath sounds. No wheezing or rales.  Chest:     Chest wall: No tenderness.  Abdominal:     Palpations: Abdomen is soft.     Tenderness: There is no abdominal tenderness. There is no guarding or rebound.  Musculoskeletal: Normal range of  motion.     Right lower leg: She exhibits no tenderness. No edema.     Left lower leg: She exhibits no tenderness. No edema.  Skin:    General: Skin is warm.     Findings: No erythema or rash.  Neurological:     Mental Status: She is alert.      ED Treatments / Results  Labs (all labs ordered are listed, but only abnormal results are displayed) Labs Reviewed  CBC WITH DIFFERENTIAL/PLATELET  COMPREHENSIVE METABOLIC PANEL  LIPASE, BLOOD  TROPONIN I  I-STAT BETA HCG BLOOD, ED ([facility], [facility], [facility] ONLY)    EKG EKG Interpretation  Date/Time:  Thursday August 10 2018 15:53:35 EDT Ventricular Rate:  73 PR Interval:  154 QRS Duration: 74 QT Interval:  404 QTC Calculation: 445 R Axis:   83 Text Interpretation:  Normal sinus rhythm Low voltage QRS Borderline ECG No significant change since last tracing Confirmed by Quintella Reichert [phone]) on 08/10/2018 4:14:29 PM   Radiology Dg Chest 2 View  Result Date: 08/10/2018 CLINICAL DATA:  Burning in chest and throat 2 months. EXAM: CHEST - 2 VIEW COMPARISON:  08/02/2018 FINDINGS: Lungs are adequately inflated and otherwise clear. Cardiomediastinal silhouette and remainder of the exam is unchanged. IMPRESSION: No active cardiopulmonary  disease. Electronically Signed   By: Marin Olp M.D.   On: 08/10/2018 16:51    Procedures Procedures (including critical care time)  Medications Ordered in ED Medications  alum & mag hydroxide-simeth (MAALOX/MYLANTA) 200-200-20 MG/5ML suspension 30 mL (30 mLs Oral Given 08/10/18 1723)    And  lidocaine (XYLOCAINE) 2 % viscous mouth solution 15 mL (15 mLs Oral Given 08/10/18 1723)     Initial Impression / Assessment and Plan / ED Course  I have reviewed the triage vital signs and the nursing notes.  Pertinent labs & imaging results that were available during my care of the patient were reviewed by me and considered in my medical decision making (see chart for details).  Clinical Course as of Aug 10 1810  Thu Aug 10, 2018  1711 No active cardiopulmonary disease noted on CXR.  DG Chest 2 View [AH]  1759 CBC and CMP are unremarkable.   CBC with Differential [AH]  1807 Patient reports symptoms have completely resolved with Maalox.   [AH]    Clinical Course User Index [AH] Arville Lime, PA-C      Patient is to be discharged with recommendation to follow up with PCP in regards to today's hospital visit. Chest pain is not likely of cardiac or pulmonary etiology d/t presentation, VSS, no tracheal deviation, no JVD or new murmur, RRR, breath sounds equal bilaterally, EKG without acute abnormalities, negative troponin, and negative CXR. Chest pain is very atypical. Do not suspect ACS or PE. Patient resolved with Maalox. Suspect symptoms are likely due to GERD. Patient states she has not been taking her PPI consistently. Advised patient to continue PPI and Carafate as previously prescribed. Advised patient to follow up with GI if symptoms persist. Pt has been advised to return to the ED if CP becomes exertional, associated with diaphoresis or nausea, radiates to left jaw/arm, worsens or becomes concerning in any way. Pt appears reliable for follow up and is agreeable to discharge.   Final  Clinical Impressions(s) / ED Diagnoses   Final diagnoses:  Atypical chest pain  Sore throat    ED Discharge Orders    None       Arville Lime, [place] 08/10/18 1813  Quintella Reichert, MD 08/10/18 1901

## 2018-08-10 NOTE — Discharge Instructions (Addendum)
You have been seen today for chest pain and sore throat. Please read and follow all provided instructions.   1. Medications: continue PPI and Carafate as previously prescribed, usual home medications 2. Treatment: rest, drink plenty of fluids 3. Follow Up: Please follow up with your Gastroenterologist if symptoms persist. Please follow up with your primary doctor in 2 days for discussion of your diagnoses and further evaluation after today's visit; if you do not have a primary care doctor use the resource guide provided to find one; Please return to the ER for any new or worsening symptoms. Please obtain all of your results from medical records or have your doctors office obtain the results - share them with your doctor - you should be seen at your doctors office. Call today to arrange your follow up.   Take medications as prescribed. Please review all of the medicines and only take them if you do not have an allergy to them. Return to the emergency room for worsening condition or new concerning symptoms. Follow up with your regular doctor. If you don't have a regular doctor use one of the numbers below to establish a primary care doctor.  Please be aware that if you are taking birth control pills, taking other prescriptions, ESPECIALLY ANTIBIOTICS may make the birth control ineffective - if this is the case, either do not engage in sexual activity or use alternative methods of birth control such as condoms until you have finished the medicine and your family doctor says it is OK to restart them. If you are on a blood thinner such as COUMADIN, be aware that any other medicine that you take may cause the coumadin to either work too much, or not enough - you should have your coumadin level rechecked in next 7 days if this is the case.  ?  It is also a possibility that you have an allergic reaction to any of the medicines that you have been prescribed - Everybody reacts differently to medications and while  MOST people have no trouble with most medicines, you may have a reaction such as nausea, vomiting, rash, swelling, shortness of breath. If this is the case, please stop taking the medicine immediately and contact your physician.  ?  You should return to the ER if you develop severe or worsening symptoms.   Emergency Department Resource Guide 1) Find a Doctor and Pay Out of Pocket Although you won't have to find out who is covered by your insurance plan, it is a good idea to ask around and get recommendations. You will then need to call the office and see if the doctor you have chosen will accept you as a new patient and what types of options they offer for patients who are self-pay. Some doctors offer discounts or will set up payment plans for their patients who do not have insurance, but you will need to ask so you aren't surprised when you get to your appointment.  2) Contact Your Local Health Department Not all health departments have doctors that can see patients for sick visits, but many do, so it is worth a call to see if yours does. If you don't know where your local health department is, you can check in your phone book. The CDC also has a tool to help you locate your state's health department, and many state websites also have listings of all of their local health departments.  3) Find a [facility] If your illness is not likely to be  very severe or complicated, you may want to try a walk in clinic. These are popping up all over the country in pharmacies, drugstores, and shopping centers. They're usually staffed by nurse practitioners or physician assistants that have been trained to treat common illnesses and complaints. They're usually fairly quick and inexpensive. However, if you have serious medical issues or chronic medical problems, these are probably not your best option.  No Primary Care Doctor: Call Health Connect at  [phone] - they can help you locate a primary care doctor that   accepts your insurance, provides certain services, etc. Physician Referral Service- [phone]  Chronic Pain Problems: Organization         Address  Phone   Notes  [facility]  [phone] Patients need to be referred by their primary care doctor.   Medication Assistance: Organization         Address  Phone   Notes  [facility] Medication [facility] [address] [place], [place] [address] [phone] --Must be a resident of [facility] -- Must have NO insurance coverage whatsoever (no Medicaid/ Medicare, etc.) -- The pt. MUST have a primary care doctor that directs their care regularly and follows them in the community   [facility]  [phone]   [employer]  [phone]    Agencies that provide inexpensive medical care: Organization         Address  Phone   Notes  [place]  [phone]   [facility]    [phone]   [facility] [place], [place] [address] [phone]   [place] [address], [place] [phone]   [facility]anned [facility]    [phone]   [facility]    [phone]   [place] and [place] [address], [place] Phone:  [phone], Fax:  [phone] Hours of Operation:  9 am - 6 pm, M-F.  Also accepts Medicaid/Medicare and self-pay.  [facility] for [place], [address], [place] Phone: [phone], Fax: [phone]. Hours of Operation:  8:30 am - 5:30 pm, M-F.  Also accepts Medicaid and self-pay.  [facility] [place] [address] [place] Phone: [phone]   [place], [place], [place] [phone], Ext. [phone] Mondays & Thursdays: 7-9 AM.  First 15 patients are seen on a first come, first serve basis.    [place] Providers:  Organization          Address  Phone   Notes  [facility] [address], [place] [phone] Also accepts self-pay patients.  [facility] [address] [place], [place]  [phone]   [place], [address], [place] [phone]   [facility] [address] [place] [phone]   Lucianne Lei [address], [place]   [phone] Only accepts [place] patients after they have their name applied to their card.   Self-Pay (no insurance) in [facility]:  Organization         Address  Phone   Notes  Sickle Cell Patients, [facility] [place] [phone]   [facility] [address] [phone]   [facility] [place]  [place], [place], [place] [phone]   [facility]/Dr. Osei-Bonsu  [address], [place] or [address], [place] [phone] Phone number for both [place] and [place] locations is the same.  [facility] [address] [place] [phone]   [facility] [address] [place] or [address] [phone]   [facility] [address], [place] [phone], phone; [phone], fax Sees patients 1st and 3rd Saturday of every month.  Must not qualify for public or private insurance (i.e. Medicaid, Medicare, [place] Health Choice, Veterans' Benefits)  Household income should be no more than 200% of the poverty level The clinic cannot treat you if you are pregnant or think you are pregnant  Sexually transmitted diseases are not treated at the clinic.

## 2018-08-10 NOTE — ED Notes (Signed)
Patient verbalizes understanding of discharge instructions. Opportunity for questioning and answers were provided. Armband removed by staff, pt discharged from ED.  

## 2018-08-10 NOTE — ED Notes (Signed)
Patient transported to X-ray 

## 2018-08-11 ENCOUNTER — Telehealth: Payer: Self-pay | Admitting: Family Medicine

## 2018-08-11 ENCOUNTER — Telehealth: Payer: Self-pay | Admitting: Gastroenterology

## 2018-08-11 MED ORDER — SUCRALFATE 1 G PO TABS: 1.0000 g | ORAL_TABLET | Freq: Three times a day (TID) | ORAL | 1 refills | Status: DC

## 2018-08-11 NOTE — Telephone Encounter (Signed)
Pt was in the ED yesterday for GERD and reported that the ED MD had given her lidocaine and Maalox.  She stated that "she had felt better than she had in the past two months" and requested a prescription for GERD symptoms.

## 2018-08-11 NOTE — Telephone Encounter (Signed)
Copied from [place] [id number]. Topic: Quick Communication - Rx Refill/Question >> Aug 11, 2018 10:40 AM Carolyn Stare wrote: Medication sucralfate (CARAFATE) 1 g tablet      PT req refill on above med, looks like a Dr Loletha Carrow fills this medication but she said Dr Juleen China has also, she said she went to the ER last night and now has GERD and said they gave her some medicine and she did not remember the name but want Dr Juleen China to RX  the medication  [facility]   Agent: Please be advised that RX refills may take up to 3 business days. We ask that you follow-up with your pharmacy.

## 2018-08-11 NOTE — Telephone Encounter (Signed)
Please see message and order for pt.

## 2018-08-11 NOTE — Telephone Encounter (Signed)
Left message on voicemail to call office.  

## 2018-08-11 NOTE — Telephone Encounter (Signed)
Pt called back, told her per Dr. Juleen China I can prescribe Maalox - also OTC. I don't prescribe Maalox with lidocaine - usually used acutely only. Okay to ask Dr. Loletha Carrow if he would be okay with it. Pt verbalized understanding and said she needed refill on Carafate. Told pt I will send to pharmacy. Pt verbalized understanding. Rx sent

## 2018-08-11 NOTE — Telephone Encounter (Signed)
I can prescribe Maalox - also OTC. I don't prescribe Maalox with lidocaine - usually used acutely only. Okay to ask Dr. Loletha Carrow if he would be okay with it.

## 2018-08-11 NOTE — Telephone Encounter (Signed)
Patient is calling in requesting Dr.Wallace also now prescribe her the maalox/mylanta and lidocaine that she received in the ED yesterday. States that is the only relief she has had in a long time.

## 2018-08-11 NOTE — Telephone Encounter (Signed)
Phone call placed to PCP by patient already for same medication.

## 2018-08-14 ENCOUNTER — Telehealth: Payer: Self-pay | Admitting: Gastroenterology

## 2018-08-14 ENCOUNTER — Other Ambulatory Visit: Payer: Self-pay | Admitting: Family Medicine

## 2018-08-14 DIAGNOSIS — Z1231 Encounter for screening mammogram for malignant neoplasm of breast: Secondary | ICD-10-CM

## 2018-08-14 MED ORDER — SUCRALFATE 1 G PO TABS: 1.0000 g | ORAL_TABLET | Freq: Three times a day (TID) | ORAL | 1 refills | Status: DC

## 2018-08-14 NOTE — Telephone Encounter (Signed)
This patient's PCP contacted me with concern that [name] is continuing to have symptoms that may be upper GI - had another ED visit for this over weekend.  If she would like to have a video conference with me, I could do it today about 3-330 today, or this Wednesday if she would prefer.  - HD

## 2018-08-14 NOTE — Telephone Encounter (Signed)
Scheduled a visit for 08-16-2018 at 2 pm tt

## 2018-08-14 NOTE — Telephone Encounter (Signed)
Left msg

## 2018-08-14 NOTE — Telephone Encounter (Signed)
See note

## 2018-08-14 NOTE — Telephone Encounter (Signed)
Copied from [place] [id number]. Topic: Quick Communication - Rx Refill/Question >> Aug 14, 2018 10:37 AM [name] wrote: Medication: sucralfate (CARAFATE) 1 g tablet (Patient needs a 90-day supply called in to pharmacy in order for her insurance to pay the costs.)  Has the patient contacted their pharmacy? Yes (Agent: If no, request that the patient contact the pharmacy for the refill.) (Agent: If yes, when and what did the pharmacy advise?)Contact PCP  [facility]erred Pharm[facility] (with phone number or street name[id number] [place] [place]. [phone] (Phone) [phone] (Fax)    Agent: Please be advised that RX refills may take up to 3 business days. We ask that you follow-up with your pharmacy.

## 2018-08-16 ENCOUNTER — Ambulatory Visit (INDEPENDENT_AMBULATORY_CARE_PROVIDER_SITE_OTHER): Payer: BLUE CROSS/BLUE SHIELD | Admitting: Gastroenterology

## 2018-08-16 ENCOUNTER — Encounter: Payer: Self-pay | Admitting: Gastroenterology

## 2018-08-16 VITALS — Ht 67.0 in | Wt 166.0 lb

## 2018-08-16 DIAGNOSIS — R1319 Other dysphagia: Secondary | ICD-10-CM

## 2018-08-16 DIAGNOSIS — R1310 Dysphagia, unspecified: Secondary | ICD-10-CM

## 2018-08-16 DIAGNOSIS — R12 Heartburn: Secondary | ICD-10-CM | POA: Diagnosis not present

## 2018-08-16 MED ORDER — LIDOCAINE VISCOUS HCL 2 % MT SOLN: 15.0000 mL | Freq: Every day | OROMUCOSAL | 0 refills | Status: DC

## 2018-08-16 NOTE — Progress Notes (Addendum)
This patient contacted our office requesting a physician telemedicine video consultation regarding clinical questions and/or test results.  If new patient, they were referred back by Dr. Briscoe Deutscher.  Participants on the conference : myself and patient   The patient consented to this consultation and was aware that a charge will be placed through their insurance.  I was in my office and the patient was at home   Encounter time:  Total time 40 minutes, with 29 minutes spent with patient on [employer] video conference   Wilfrid Lund, MD   _____________________________________________________________________________________________               [facility] Progress Note  Chief Complaint: Chest pain  Subjective  History:  Josaphine had a virtual visit with me on 07/25/2018 for heartburn in the setting of chest pain, cough chills and recent viral infection that was suspected to been COVID-19.  It began with fatigue, then a few loose stools, then progressed to severe chest congestion and diffuse myalgias.   Multiple symptoms have continued since then, including chest pain, fatigue, upper abdominal pain and bloating.  She was back in the emergency department late last week for the same symptoms.  There has been little or no improvement on Nexium, Pepcid or Carafate. She describes it as a burning soreness in the chest worse after she eats meat, as well as a intermittent burning epigastric pain.  She apparently had temporary but complete relief of the pain from viscous lidocaine administered in the ED.  As mentioned in my prior notes, Jenniferlynn had previously seen me for decades of constipation predominant IBS with prominent bloating and distention.  Previously cared for by Dr. [name], with no symptom improvement on trials of amities a, MiraLAX and hyoscyamine.  She had a normal screening colonoscopy with me in January of this year.   Aribella still continues to feel unwell, and symptoms have  evolved over the last couple of weeks to become a chest pain that is usually centralized, though sometimes radiating to right or left shoulder.  It is now nearly always worse after meals.  Some things that used to agree with her no longer do so.  She often feels as if food is "just sitting" in the lower chest since that is when she has more of a burning and heavy discomfort.  She does not have nausea or vomiting or weight loss.  ROS: Cardiovascular:  No exertional component Respiratory: no dyspnea - getting over 10,000 steps a day   The patient's Past Medical, Family and Social History were reviewed and are on file in the EMR.  Objective:  Med list reviewed  Current Outpatient Medications:  .  BIOTIN PO, Take 1 tablet by mouth daily. , Disp: , Rfl:  .  cholecalciferol (VITAMIN D) 25 MCG (1000 UT) tablet, TAKE 1 TABLET BY MOUTH EVERY DAY (Patient taking differently: Take 1,000 Units by mouth daily. ), Disp: 90 tablet, Rfl: 0 .  COLLAGEN PO, Take 1 tablet by mouth daily. , Disp: , Rfl:  .  esomeprazole (NEXIUM) 40 MG capsule, Take 1 capsule (40 mg total) by mouth daily., Disp: 30 capsule, Rfl: 3 .  famotidine (PEPCID AC) 10 MG tablet, Take 1 tablet (10 mg total) by mouth 2 (two) times daily., Disp: 60 tablet, Rfl: 2 .  FeFum-FePoly-FA-B Cmp-C-Biot (INTEGRA PLUS) CAPS, TAKE 1 CAPSULE BY MOUTH EVERY DAY (Patient taking differently: Take 1 capsule by mouth daily. ), Disp: 90 capsule, Rfl: 0 .  hydrochlorothiazide (HYDRODIURIL) 25  MG tablet, TAKE 1 TABLET BY MOUTH ONCE A DAY EVERY MORNING (Patient taking differently: Take 25 mg by mouth daily. ), Disp: 90 tablet, Rfl: 0 .  lisinopril (PRINIVIL,ZESTRIL) 10 MG tablet, TAKE 1 TABLET BY MOUTH EVERY DAY (Patient taking differently: Take 10 mg by mouth daily. ), Disp: 90 tablet, Rfl: 1 .  sucralfate (CARAFATE) 1 g tablet, Take 1 tablet (1 g total) by mouth 4 (four) times daily -  with meals and at bedtime., Disp: 60 tablet, Rfl: 1 .  vitamin B-12  (CYANOCOBALAMIN) 1000 MCG tablet, Take 1,000 mcg by mouth daily., Disp: , Rfl:  .  lidocaine (XYLOCAINE) 2 % solution, Use as directed 15 mLs in the mouth or throat at bedtime., Disp: 100 mL, Rfl: 0    No exam - virtual visit Took her VS at home:  Normal temp 138/92 , pulse  Recent Labs:  CBC Latest Ref Rng & Units 08/10/2018 08/02/2018 11/23/2017  WBC 4.0 - 10.5 K/uL 5.1 5.0 5.0  Hemoglobin 12.0 - 15.0 g/dL 13.1 12.2 13.5  Hematocrit 36.0 - 46.0 % 39.1 36.0 40.0  Platelets 150 - 400 K/uL 187 161 188.0   CMP Latest Ref Rng & Units 08/10/2018 08/02/2018 01/27/2018  Glucose 70 - 99 mg/dL 93 129(H) 79  BUN 6 - 20 mg/dL 12 <5(L) 25(H)  Creatinine 0.44 - 1.00 mg/dL 0.94 0.84 1.00  Sodium 135 - 145 mmol/L 139 132(L) 139  Potassium 3.5 - 5.1 mmol/L 3.6 3.3(L) 4.1  Chloride 98 - 111 mmol/L 100 97(L) 103  CO2 22 - 32 mmol/L 26 22 30   Calcium 8.9 - 10.3 mg/dL 10.0 9.5 10.0  Total Protein 6.5 - 8.1 g/dL 7.5 - 7.5  Total Bilirubin 0.3 - 1.2 mg/dL 0.4 - 0.7  Alkaline Phos 38 - 126 U/L 62 - 62  AST 15 - 41 U/L 24 - 16  ALT 0 - 44 U/L 23 - 13     Radiologic studies:  Normal CXR 4/30 and 4/22  @ASSESSMENTPLANBEGIN @ Assessment: Encounter Diagnoses  Name Primary?  . Heartburn Yes  . Esophageal dysphagia    This has been somewhat puzzling, and symptoms have evolved over the last few weeks.  It is still not clear to me that this is truly GERD, since she does not have regurgitation, and has felt little or no improvement from acid suppression therapy.  It still sounds like this began a month ago with an acute infectious illness.  I wonder if she could have a postinfectious flare of dyspepsia and functional heartburn, especially with an underlying functional bowel disorder.  Nevertheless, persistent symptoms requiring emergency department visits and now what sounds like dysphagia warrant further investigation.  Plan: I prescribed viscous lidocaine to be taken 1 tablespoon mixed with a tablespoon  of Maalox at bedtime, 100 mL supply given no refills  We will contact her to set up an upper endoscopy with me early next week.  She is agreeable after discussion of procedure and risks.   Nelida Meuse III

## 2018-08-18 ENCOUNTER — Telehealth: Payer: Self-pay | Admitting: *Deleted

## 2018-08-18 NOTE — Telephone Encounter (Signed)
Covid-19 travel screening questions  Have you traveled in the last 14 days? no If yes where?  Do you now or have you had a fever in the last 14 days? no  Do you have any respiratory symptoms of shortness of breath or cough now or in the last 14 days? no  Do you have any family members or close contacts with diagnosed or suspected Covid-19? No  Pt is aware that care partner will be waiting in the car.  Pt will wear a mask into building

## 2018-08-21 ENCOUNTER — Ambulatory Visit (AMBULATORY_SURGERY_CENTER): Payer: BLUE CROSS/BLUE SHIELD | Admitting: Gastroenterology

## 2018-08-21 ENCOUNTER — Encounter: Payer: Self-pay | Admitting: Gastroenterology

## 2018-08-21 ENCOUNTER — Other Ambulatory Visit: Payer: Self-pay

## 2018-08-21 ENCOUNTER — Encounter: Payer: BLUE CROSS/BLUE SHIELD | Admitting: Gastroenterology

## 2018-08-21 VITALS — BP 139/78 | HR 82 | Temp 98.4°F | Resp 17 | Ht 67.0 in | Wt 166.0 lb

## 2018-08-21 DIAGNOSIS — R140 Abdominal distension (gaseous): Secondary | ICD-10-CM | POA: Diagnosis not present

## 2018-08-21 DIAGNOSIS — R1310 Dysphagia, unspecified: Secondary | ICD-10-CM | POA: Diagnosis not present

## 2018-08-21 DIAGNOSIS — R0789 Other chest pain: Secondary | ICD-10-CM | POA: Diagnosis not present

## 2018-08-21 DIAGNOSIS — R1319 Other dysphagia: Secondary | ICD-10-CM

## 2018-08-21 MED ORDER — SODIUM CHLORIDE 0.9 % IV SOLN: 500.0000 mL | Freq: Once | INTRAVENOUS | Status: DC

## 2018-08-21 NOTE — Op Note (Signed)
[facility] Patient Name: Erica [name] Procedure Date: 08/21/2018 2:58 PM MRN: [id number] Endoscopist: Sherilyn Cooter L. Myrtie Neither , MD Age: 53 Referring MD:  Date of Birth: Oct 05, 1965 Gender: Female Account #: [id number] Procedure:                Upper GI endoscopy Indications:              Dysphagia, Abdominal bloating, Unexplained chest                            pain (persisting since respiratory illness last                            month) Medicines:                Monitored Anesthesia Care Procedure:                Pre-Anesthesia Assessment:                           - Prior to the procedure, a History and Physical                            was performed, and patient medications and                            allergies were reviewed. The patient's tolerance of                            previous anesthesia was also reviewed. The risks                            and benefits of the procedure and the sedation                            options and risks were discussed with the patient.                            All questions were answered, and informed consent                            was obtained. Prior Anticoagulants: The patient has                            taken no previous anticoagulant or antiplatelet                            agents. ASA Grade Assessment: II - A patient with                            mild systemic disease. After reviewing the risks                            and benefits, the patient was deemed in  satisfactory condition to undergo the procedure.                           After obtaining informed consent, the endoscope was                            passed under direct vision. Throughout the                            procedure, the patient's blood pressure, pulse, and                            oxygen saturations were monitored continuously. The                            Model GIF-[id number]) scope was introduced                           through the mouth, and advanced to the second part                            of duodenum. The upper GI endoscopy was                            accomplished without difficulty. The patient                            tolerated the procedure well. Scope In: Scope Out: Findings:                 The esophagus was normal.                           The stomach was normal.                           The cardia and gastric fundus were normal on                            retroflexion.                           The examined duodenum was normal. Complications:            No immediate complications. Estimated Blood Loss:     Estimated blood loss: none. Impression:               - Normal esophagus.                           - Normal stomach.                           - Normal examined duodenum.                           - No specimens collected. Recommendation:           - Patient has a  contact number available for                            emergencies. The signs and symptoms of potential                            delayed complications were discussed with the                            patient. Return to normal activities tomorrow.                            Written discharge instructions were provided to the                            patient.                           - Resume previous diet.                           - Continue present medications. Viscous lidocaine                            was prescribed last week to try for several days                            because it had reportedly helped during recent ED                            visit. Patient will consider trying it. If helpful,                            a trial of buspirone could be used for suspected                            post-infectious functional chest pain/dyspepsia.                           - Contact primary care provider for follow-up and                            discussion of whether  additional thoracic imaging                            needed. Aerik Polan L. Myrtie Neither, MD 08/21/2018 3:31:32 PM This report has been signed electronically.

## 2018-08-21 NOTE — Progress Notes (Signed)
Report to PACU, RN, vss, BBS= Clear.  

## 2018-08-21 NOTE — Patient Instructions (Signed)
YOU HAD AN ENDOSCOPIC PROCEDURE TODAY AT [facility]:   Refer to the procedure report that was given to you for any specific questions about what was found during the examination.  If the procedure report does not answer your questions, please call your gastroenterologist to clarify.  If you requested that your care partner not be given the details of your procedure findings, then the procedure report has been included in a sealed envelope for you to review at your convenience later.  YOU SHOULD EXPECT: Some feelings of bloating in the abdomen. Passage of more gas than usual.  Walking can help get rid of the air that was put into your GI tract during the procedure and reduce the bloating. If you had a lower endoscopy (such as a colonoscopy or flexible sigmoidoscopy) you may notice spotting of blood in your stool or on the toilet paper. If you underwent a bowel prep for your procedure, you may not have a normal bowel movement for a few days.  Please Note:  You might notice some irritation and congestion in your nose or some drainage.  This is from the oxygen used during your procedure.  There is no need for concern and it should clear up in a day or so.  SYMPTOMS TO REPORT IMMEDIATELY:    Following upper endoscopy (EGD)  Vomiting of blood or coffee ground material  New chest pain or pain under the shoulder blades  Painful or persistently difficult swallowing  New shortness of breath  Fever of 100F or higher  Black, tarry-looking stools  For urgent or emergent issues, a gastroenterologist can be reached at any hour by calling [phone].   DIET:  We do recommend a small meal at first, but then you may proceed to your regular diet.  Drink plenty of fluids but you should avoid alcoholic beverages for 24 hours.  ACTIVITY:  You should plan to take it easy for the rest of today and you should NOT DRIVE or use heavy machinery until tomorrow (because of the sedation medicines used  during the test).    FOLLOW UP: Our staff will call the number listed on your records the next business day following your procedure to check on you and address any questions or concerns that you may have regarding the information given to you following your procedure. If we do not reach you, we will leave a message.  However, if you are feeling well and you are not experiencing any problems, there is no need to return our call.  We will assume that you have returned to your regular daily activities without incident. We will be calling you two weeks following your procedure to see if you have developed any symptoms of the COVID-19.  If you develop any symptoms before then, please let us know.  If any biopsies were taken you will be contacted by phone or by letter within the next 1-3 weeks.  Please call us at [phone] if you have not heard about the biopsies in 3 weeks.    SIGNATURES/CONFIDENTIALITY: You and/or your care partner have signed paperwork which will be entered into your electronic medical record.  These signatures attest to the fact that that the information above on your After Visit Summary has been reviewed and is understood.  Full responsibility of the confidentiality of this discharge information lies with you and/or your care-partner.

## 2018-08-22 ENCOUNTER — Telehealth: Payer: Self-pay | Admitting: *Deleted

## 2018-08-22 NOTE — Progress Notes (Signed)
Please set up CT chest and stress test. Inform patient.

## 2018-08-22 NOTE — Telephone Encounter (Signed)
  Follow up Call-  Call back number 08/21/2018 05/12/2018  Post procedure Call Back phone  # [phone]  Permission to leave phone message Yes Yes  Some recent data might be hidden     Patient questions:  Do you have a fever, pain , or abdominal swelling? No. Pain Score  0 *  Have you tolerated food without any problems? Yes.    Have you been able to return to your normal activities? Yes.    Do you have any questions about your discharge instructions: Diet   No. Medications  No. Follow up visit  No.  Do you have questions or concerns about your Care? No.  Actions: * If pain score is 4 or above: No action needed, pain <4.

## 2018-08-24 ENCOUNTER — Telehealth: Payer: Self-pay | Admitting: *Deleted

## 2018-08-24 NOTE — Telephone Encounter (Signed)
1. Have you developed a fever since your procedure? no  2.   Have you had an respiratory symptoms (SOB or cough) since your procedure? no  3.   Have you tested positive for COVID 19 since your procedure no  3.   Have you had any family members/close contacts diagnosed with the COVID 19 since your procedure?  no   If any of these questions are a yes, please inquire if patient has been seen by family doctor and route this note to Tracy Walton, RN.  

## 2018-08-25 ENCOUNTER — Other Ambulatory Visit: Payer: Self-pay

## 2018-08-25 DIAGNOSIS — R079 Chest pain, unspecified: Secondary | ICD-10-CM

## 2018-08-25 NOTE — Progress Notes (Signed)
Order placed to CT chest and referral to cardiology for stress test

## 2018-08-27 ENCOUNTER — Other Ambulatory Visit: Payer: Self-pay | Admitting: Family Medicine

## 2018-08-28 ENCOUNTER — Telehealth: Payer: Self-pay | Admitting: Family Medicine

## 2018-08-28 NOTE — Telephone Encounter (Signed)
LVM for patient to call to discuss grievance.    From: Francine Graven  Sent: 08/24/2018  3:07 PM EDT  To: Briscoe Deutscher, DO  Subject: FW: Pt req to speak to office manager       I spoke to [place] at [facility] [phone]. This patient called to start a grievance. She states that she has been denied service on 2 occasions. I let Ulice Dash know that I would need to speak to you about this patient to get more information before reaching out to patient. I explained that you were out of the office today and I will be out of the office tomorrow. That it would be Monday before I could respond.

## 2018-08-29 ENCOUNTER — Ambulatory Visit: Payer: Self-pay

## 2018-08-29 ENCOUNTER — Telehealth: Payer: Self-pay | Admitting: *Deleted

## 2018-08-29 NOTE — Telephone Encounter (Signed)

## 2018-08-29 NOTE — Telephone Encounter (Signed)
Called patient to get clarification on message. She is not having any increased or new chest pain, no sob, head aches, dizziness or any other symptoms. She has been taking sudafed for several days and just checked her blood pressure now. She will d/c the medication she is aware that it can increase blood pressure. She will recheck her blood pressure tonight and tomorrow and let us know if any issue.

## 2018-08-29 NOTE — Telephone Encounter (Signed)
See note

## 2018-08-29 NOTE — Telephone Encounter (Signed)
Incoming call from patient  Statin that her blood pressure has tried the Sudafed,  Chest congestion Sudafed. Patient reports the following  B/P  168/98 , 161/92 . Patient report she will be going for a CT scan tomorrow. Feels bloated all the time.  Hypertenxion started on May 2nd.  Uses auto mattic machine.   Reason for Disposition . [4] Systolic BP  >= 599 OR Diastolic >= 774 AND [1] cardiac or neurologic symptoms (e.g., chest pain, difficulty breathing, unsteady gait, blurred vision)  Answer Assessment - Initial Assessment Questions 1. BLOOD PRESSURE: "What is the blood pressure?" "Did you take at least two measurements 5 minutes apart?"     *No Answer* 2. ONSET: "When did you take your blood pressure?"     May 2nd 3. HOW: "How did you obtain the blood pressure?" (e.g., visiting nurse, automatic home BP monitor)    yes 4. HISTORY: "Do you have a history of high blood pressure?"    Yes  5. MEDICATIONS: "Are you taking any medications for blood pressure?" "Have you missed any doses recently?"   denies 6. OTHER SYMPTOMS: "Do you have any symptoms?" (e.g., headache, chest pain, blurred vision, difficulty breathing, weakness)    Chest pain since April ,  7. PREGNANCY: "Is there any chance you are pregnant?" "When was your last menstrual period?"   Denies  Protocols used: HIGH BLOOD PRESSURE-A-AH

## 2018-08-30 ENCOUNTER — Other Ambulatory Visit: Payer: Self-pay

## 2018-08-30 ENCOUNTER — Ambulatory Visit (INDEPENDENT_AMBULATORY_CARE_PROVIDER_SITE_OTHER)
Admission: RE | Admit: 2018-08-30 | Discharge: 2018-08-30 | Disposition: A | Discharge status: Home / Self Care | Payer: BLUE CROSS/BLUE SHIELD | Source: Ambulatory Visit | Attending: Family Medicine | Admitting: Family Medicine

## 2018-08-30 DIAGNOSIS — R079 Chest pain, unspecified: Secondary | ICD-10-CM | POA: Diagnosis not present

## 2018-08-30 DIAGNOSIS — R0602 Shortness of breath: Secondary | ICD-10-CM | POA: Diagnosis not present

## 2018-08-31 ENCOUNTER — Other Ambulatory Visit: Payer: Self-pay

## 2018-08-31 ENCOUNTER — Ambulatory Visit (HOSPITAL_COMMUNITY): Payer: BLUE CROSS/BLUE SHIELD

## 2018-08-31 ENCOUNTER — Other Ambulatory Visit: Payer: Self-pay | Admitting: Family Medicine

## 2018-08-31 DIAGNOSIS — R933 Abnormal findings on diagnostic imaging of other parts of digestive tract: Secondary | ICD-10-CM

## 2018-08-31 NOTE — Addendum Note (Signed)
Addended by: Francella Solian on: 08/31/2018 12:45 PM   Modules accepted: Orders

## 2018-09-01 ENCOUNTER — Ambulatory Visit: Admission: RE | Admit: 2018-09-01 | Payer: BLUE CROSS/BLUE SHIELD | Source: Ambulatory Visit

## 2018-09-01 ENCOUNTER — Inpatient Hospital Stay: Admission: RE | Admit: 2018-09-01 | Payer: BLUE CROSS/BLUE SHIELD | Source: Ambulatory Visit

## 2018-09-01 ENCOUNTER — Other Ambulatory Visit: Payer: Self-pay

## 2018-09-01 ENCOUNTER — Ambulatory Visit (HOSPITAL_COMMUNITY)
Admission: RE | Admit: 2018-09-01 | Discharge: 2018-09-01 | Disposition: A | Discharge status: Home / Self Care | Payer: BLUE CROSS/BLUE SHIELD | Source: Ambulatory Visit | Attending: Family Medicine | Admitting: Family Medicine

## 2018-09-01 DIAGNOSIS — K7689 Other specified diseases of liver: Secondary | ICD-10-CM | POA: Diagnosis not present

## 2018-09-01 DIAGNOSIS — R933 Abnormal findings on diagnostic imaging of other parts of digestive tract: Secondary | ICD-10-CM | POA: Diagnosis not present

## 2018-09-01 MED ORDER — IOHEXOL 300 MG/ML  SOLN
100.0000 mL | Freq: Once | INTRAMUSCULAR | Status: AC | PRN
  Administered 2018-09-01: 100 mL via INTRAVENOUS

## 2018-09-05 ENCOUNTER — Other Ambulatory Visit: Payer: BLUE CROSS/BLUE SHIELD

## 2018-09-05 ENCOUNTER — Telehealth: Payer: Self-pay | Admitting: Family Medicine

## 2018-09-05 NOTE — Telephone Encounter (Signed)
Call is from August 02 2018 and she did proceed to ER as advised.  No further action needed.

## 2018-09-05 NOTE — Telephone Encounter (Signed)
[place] [facility] [place] Patient Name: Erica [name] Gender: Female DOB: 1966-02-25 Age: 53 Y 11 M 17 D Return Phone Number: [phone] (Primary) Address: City/State/Zip: [place] [address] Client [facility] [place] Client Site [place] [facility] [place] Night Physician Briscoe Deutscher- DO Contact Type Call Who Is Calling Patient / Member / Family / Caregiver Call Type Triage / Clinical Relationship To Patient Self Return Phone Number [phone] (Primary) Chief Complaint CHEST PAIN (>=21 years) - pain, pressure, heaviness or tightness Reason for Call Symptomatic / Request for [place] states that they saw DO three weeks ago for congestion. DO advised pt. to quarentine with concern that pt. has COVID19. Pt. reports that they continue to have left arm pain, gas, pain in neck, left arm and chest pain. Pt. has a history of irritable bowel and acid reflux disorder. Translation No Nurse Assessment Nurse: Acey Lav, RN, Estill Bamberg Date/Time (Eastern Time): 08/02/2018 7:11:15 PM Confirm and document reason for call. If symptomatic, describe symptoms. ---Caller states that they saw DO three weeks ago for congestion. DO advised pt. to quarantine with concern that pt. has COVID19. Pt. reports that they continue to have left arm pain, gas, pain in neck, left arm and chest pain. Chest pain is constant. Two nights ago really painful. It comes and goes chest pain to left arm. Has the patient had close contact with a person known or suspected to have the novel coronavirus illness OR traveled / lives in area with major community spread (including international travel) in the last 14 days from the onset of symptoms? * If Asymptomatic, screen for exposure and travel within the last 14 days. ---Yes Does the patient have any new or worsening  symptoms? ---Yes Will a triage be completed? ---Yes Related visit to physician within the last 2 weeks? ---Yes Does the PT have any chronic conditions? (i.e. diabetes, asthma, this includes High risk factors for pregnancy, etc.) ---Yes List chronic conditions. ---Pt. has a history of irritable bowel and acid reflux disorder. HTN Is the patient pregnant or possibly pregnant? (Ask all females between the ages of 57-55) ---No Is this a behavioral health or substance abuse call? ---No PLEASE NOTE: All timestamps contained within this report are represented as [place] Standard Time. CONFIDENTIALTY NOTICE: This fax transmission is intended only for the addressee. It contains information that is legally privileged, confidential or otherwise protected from use or disclosure. If you are not the intended recipient, you are strictly prohibited from reviewing, disclosing, copying using or disseminating any of this information or taking any action in reliance on or regarding this information. If you have received this fax in error, please notify us immediately by telephone so that we can arrange for its return to [place]. Phone: [phone], Toll-Free: [phone], Fax: [phone] Page: 2 of 2 Call Id: [id number] Guidelines Guideline Title Affirmed Question Affirmed Notes Nurse Date/Time Eilene Ghazi Time) Coronavirus (COVID-19) - Diagnosed or Suspected Chest pain Acey Lav, RN, Estill Bamberg 08/02/2018 7:12:55 PM Disp. Time Eilene Ghazi Time) Disposition Final User 08/02/2018 7:08:36 PM Send to Urgent Fae Pippin 08/02/2018 7:23:02 PM Called On-Call Provider Acey Lav, RN, Estill Bamberg 08/02/2018 7:25:35 PM Upgraded Outcome Per Physician Acey Lav, RN, Estill Bamberg Reason: Per on call Dr. Sharlet Salina: Go to ED now outcome based on s/s. [facility] ED: caller advised. 08/02/2018 7:22:20 PM Call PCP Now Yes Acey Lav, RN, Shelly Coss Disagree/Comply Comply Caller Understands Yes PreDisposition Did  not know what to do Care  Advice Given Per Guideline CALL PCP NOW: * You need to discuss this with your doctor (or NP/PA). * I'll page the on-call provider now. If you haven't heard from the provider (or me) within 30 minutes, call again. * Feeling dehydrated: Drink extra liquids. If the air in your home is dry, use a humidifier. GENERAL CARE ADVICE FOR COVID-19 SYMPTOMS: CARE ADVICE given per CORONAVIRUS (COVID-19) - DIAGNOSED OR SUSPECTED (Adult) guideline. CALL BACK IF: * Difficulty breathing occurs * You become worse. Referrals REFERRED TO PCP OFFICE Paging DoctorName Phone DateTime Result/Outcome Message Type Notes Pricilla Holm- MD [phone] 08/02/2018 7:23:02 PM Called On Call Provider - Reached Doctor Paged Pricilla Holm- MD 08/02/2018 7:23:16 PM Spoke with On Call - General Message Result based on s/s go to ED now: [facility] ED preferred

## 2018-09-06 ENCOUNTER — Encounter: Payer: Self-pay | Admitting: Family Medicine

## 2018-09-06 ENCOUNTER — Telehealth: Payer: Self-pay | Admitting: Family Medicine

## 2018-09-06 DIAGNOSIS — D1803 Hemangioma of intra-abdominal structures: Secondary | ICD-10-CM

## 2018-09-06 HISTORY — DX: Hemangioma of intra-abdominal structures: D18.03

## 2018-09-06 NOTE — Telephone Encounter (Signed)
Please advise   Copied from [place] [id number]. Topic: General - Other >> Sep 06, 2018 10:04 AM Antonieta Iba C wrote: Reason for CRM: pt called in to speak with assistant. Pt says that she think that the medication hydrochlorothiazide (HYDRODIURIL) 25 MG tablet is causing her discomfort. Pt says that she has been having some stomach pain, pt noticed that she get this discomfort after taking medication. Pt would like to know if provider could change her medication?   Please assist. >> Sep 06, 2018 10:18 AM Carole Binning B wrote: Schedule doxy to discuss in depth.

## 2018-09-06 NOTE — Telephone Encounter (Signed)
Does pt need virtual or in office?

## 2018-09-06 NOTE — Telephone Encounter (Signed)
Schedule a visit

## 2018-09-06 NOTE — Telephone Encounter (Signed)
Called pt and left vm to schedule at 4pm 5/28

## 2018-09-06 NOTE — Telephone Encounter (Signed)
Pt reports that she get congestion after taking HCTZ. She also c/o abd bloating. She has been on this medication for years. She would like for Dr. Juleen China to send in a different medication for her to try.   Last OV 08/08/18 CT abd and chest 08/2018  Forwarding to Dr. Juleen China to advise.

## 2018-09-06 NOTE — Telephone Encounter (Signed)
That is fine 

## 2018-09-06 NOTE — Telephone Encounter (Signed)
In office please. Okay to work in tomorrow if we have to - ask Elim where to put.

## 2018-09-06 NOTE — Telephone Encounter (Signed)
Called pt and left VM to call the office.  

## 2018-09-06 NOTE — Telephone Encounter (Signed)
Pt declined in office visit and wants virtual

## 2018-09-07 ENCOUNTER — Encounter: Payer: Self-pay | Admitting: Family Medicine

## 2018-09-07 ENCOUNTER — Other Ambulatory Visit: Payer: Self-pay

## 2018-09-07 ENCOUNTER — Ambulatory Visit (INDEPENDENT_AMBULATORY_CARE_PROVIDER_SITE_OTHER): Payer: BLUE CROSS/BLUE SHIELD | Admitting: Family Medicine

## 2018-09-07 DIAGNOSIS — I10 Essential (primary) hypertension: Secondary | ICD-10-CM

## 2018-09-07 DIAGNOSIS — D1803 Hemangioma of intra-abdominal structures: Secondary | ICD-10-CM | POA: Diagnosis not present

## 2018-09-07 DIAGNOSIS — R140 Abdominal distension (gaseous): Secondary | ICD-10-CM

## 2018-09-07 DIAGNOSIS — R1013 Epigastric pain: Secondary | ICD-10-CM | POA: Diagnosis not present

## 2018-09-07 MED ORDER — LISINOPRIL 20 MG PO TABS: 20.0000 mg | ORAL_TABLET | Freq: Every day | ORAL | 3 refills | Status: DC

## 2018-09-07 NOTE — Progress Notes (Signed)
Virtual Visit via Video   Due to the COVID-19 pandemic, this visit was completed with telemedicine (audio/video) technology to reduce patient and provider exposure as well as to preserve personal protective equipment.   I connected with Erica [name] by a video enabled telemedicine application and verified that I am speaking with the correct person using two identifiers. Location patient: Home Location provider: [facility], Office Persons participating in the virtual visit: Erica [name], [place], DO   I discussed the limitations of evaluation and management by telemedicine and the availability of in person appointments. The patient expressed understanding and agreed to proceed.  Care Team   Patient Care Team: Briscoe Deutscher, DO as PCP - General (Family Medicine) Shelly Bombard, MD as Consulting Physician (Obstetrics and Gynecology)  Subjective:   HPI: Patient reports congestion, abdominal fullness that is ongoing, see previous notes and messages. She wonders if it due to HCTZ, read that it could cause this. Wants to stop. Also on low dose Lisinopril. No issues with edema previously.   Reviewed recent images and discussed future monitoring.    Dg Chest 2 View  Result Date: 08/10/2018 CLINICAL DATA:  Burning in chest and throat 2 months. EXAM: CHEST - 2 VIEW COMPARISON:  08/02/2018 FINDINGS: Lungs are adequately inflated and otherwise clear. Cardiomediastinal silhouette and remainder of the exam is unchanged. IMPRESSION: No active cardiopulmonary disease. Electronically Signed   By: Marin Olp M.D.   On: 08/10/2018 16:51   Ct Chest Wo Contrast  Result Date: 08/31/2018 CLINICAL DATA:  Chest pain and shortness of breath. Evaluate for pleural effusion. EXAM: CT CHEST WITHOUT CONTRAST TECHNIQUE: Multidetector CT imaging of the chest was performed following the standard protocol without IV contrast. COMPARISON:  Chest x-ray August 10, 2018 FINDINGS: Cardiovascular: No  significant vascular findings. Normal heart size. No pericardial effusion. Mediastinum/Nodes: No enlarged mediastinal or axillary lymph nodes. Thyroid gland, trachea, and esophagus demonstrate no significant findings. Lungs/Pleura: Lungs are clear. No pleural effusion or pneumothorax. Upper Abdomen: There is of 5.3 x 3.7 x 4.8 cm low-density mass in the posterior segment right lobe liver. The other visualized upper abdominal structures are unremarkable. Musculoskeletal: No chest wall mass or suspicious bone lesions identified. IMPRESSION: No acute abnormality identified in the chest. There is no pleural effusion or focal pneumonia. 5.3 cm low-density mass in the liver. Recommend further evaluation with three-phase liver CT. Electronically Signed   By: Abelardo Diesel M.D.   On: 08/31/2018 07:56   Ct Abdomen W Wo Contrast  Result Date: 09/01/2018 CLINICAL DATA:  Evaluate liver mass. EXAM: CT ABDOMEN WITHOUT AND WITH CONTRAST TECHNIQUE: Multidetector CT imaging of the abdomen was performed following the standard protocol before and following the bolus administration of intravenous contrast. CONTRAST:  122mL OMNIPAQUE IOHEXOL 300 MG/ML  SOLN COMPARISON:  Chest CT of 08/30/2018.  No prior abdominal imaging. FINDINGS: Lower chest: Clear lung bases. Normal heart size without pericardial or pleural effusion. Hepatobiliary: No evidence of cirrhosis. The chest CT abnormality corresponds to a giant hemangioma within the posterior right hepatic lobe. On the order of 5.7 x 3.6 by 5.5 cm on image 32/11. Normal gallbladder, without biliary ductal dilatation. Pancreas: Normal, without mass or ductal dilatation. Spleen: Normal in size, without focal abnormality. Adrenals/Urinary Tract: Normal adrenal glands. No renal calculi or hydronephrosis. Normal post-contrast appearance of the kidneys. Stomach/Bowel: Proximal gastric underdistention. Normal small bowel. Vascular/Lymphatic: Normal caliber of the aorta and branch vessels. No  retroperitoneal or retrocrural adenopathy. Other: No ascites. Musculoskeletal:  No acute osseous abnormality. IMPRESSION: 1. The chest CT abnormality represents a giant cavernous hemangioma within the right hepatic lobe. 2.  No acute abdominal process. Electronically Signed   By: Abigail Miyamoto M.D.   On: 09/01/2018 17:11      Review of Systems  Constitutional: Positive for malaise/fatigue. Negative for chills, fever and weight loss.  Respiratory: Negative for shortness of breath and wheezing.   Cardiovascular: Negative for palpitations and leg swelling.  Gastrointestinal: Positive for abdominal pain. Negative for constipation, diarrhea, nausea and vomiting.  Genitourinary: Negative for dysuria and urgency.  Musculoskeletal: Negative for joint pain and myalgias.  Skin: Negative for rash.  Neurological: Negative for dizziness and headaches.  Psychiatric/Behavioral: Negative for depression, substance abuse and suicidal ideas. The patient is not nervous/anxious.      Patient Active Problem List   Diagnosis Date Noted  . Liver hemangioma, giant, incidental finding 09/06/2018  . Dyspepsia 07/10/2018  . Osteopenia 11/26/2016  . Vitamin D deficiency 11/26/2016  . Essential hypertension 08/04/2006  . IBS 08/04/2006    Social History   Tobacco Use  . Smoking status: Never Smoker  . Smokeless tobacco: Never Used  Substance Use Topics  . Alcohol use: No    Alcohol/week: 0.0 standard drinks    Comment: occ    Current Outpatient Medications:  .  BIOTIN PO, Take 1 tablet by mouth daily. , Disp: , Rfl:  .  cholecalciferol (VITAMIN D) 25 MCG (1000 UT) tablet, TAKE 1 TABLET BY MOUTH EVERY DAY (Patient taking differently: Take 1,000 Units by mouth daily. ), Disp: 90 tablet, Rfl: 0 .  COLLAGEN PO, Take 1 tablet by mouth daily. , Disp: , Rfl:  .  esomeprazole (NEXIUM) 40 MG capsule, Take 1 capsule (40 mg total) by mouth daily., Disp: 30 capsule, Rfl: 3 .  famotidine (PEPCID AC) 10 MG tablet, Take  1 tablet (10 mg total) by mouth 2 (two) times daily., Disp: 60 tablet, Rfl: 2 .  FeFum-FePoly-FA-B Cmp-C-Biot (INTEGRA PLUS) CAPS, TAKE 1 CAPSULE BY MOUTH EVERY DAY (Patient taking differently: Take 1 capsule by mouth daily. ), Disp: 90 capsule, Rfl: 0 .  lidocaine (XYLOCAINE) 2 % solution, Use as directed 15 mLs in the mouth or throat at bedtime., Disp: 100 mL, Rfl: 0 .  lisinopril (PRINIVIL,ZESTRIL) 10 MG tablet, TAKE 1 TABLET BY MOUTH EVERY DAY (Patient taking differently: Take 10 mg by mouth daily. ), Disp: 90 tablet, Rfl: 1 .  sucralfate (CARAFATE) 1 g tablet, Take 1 tablet (1 g total) by mouth 4 (four) times daily -  with meals and at bedtime., Disp: 60 tablet, Rfl: 1 .  vitamin B-12 (CYANOCOBALAMIN) 1000 MCG tablet, Take 1,000 mcg by mouth daily., Disp: , Rfl:  .  lisinopril (ZESTRIL) 20 MG tablet, Take 1 tablet (20 mg total) by mouth daily., Disp: 90 tablet, Rfl: 3  No Known Allergies  Objective:   VITALS: Per patient if applicable, see vitals. GENERAL: Alert and in no acute distress. CARDIOPULMONARY: No increased WOB. Speaking in clear sentences.  PSYCH: Pleasant and cooperative. Speech normal rate and rhythm. Affect is appropriate. Insight and judgement are appropriate. Attention is focused, linear, and appropriate.  NEURO: Oriented as arrived to appointment on time with no prompting.   Depression screen PHQ 2/9 11/26/2016  Decreased Interest 0  Down, Depressed, Hopeless 0  PHQ - 2 Score 0    Assessment and Plan:   Ru[place]da was seen today for follow-up.  Diagnoses and all orders for this visit:  Essential  hypertension  Postprandial abdominal bloating  Liver hemangioma, giant, incidental finding  Dyspepsia  Other orders -     lisinopril (ZESTRIL) 20 MG tablet; Take 1 tablet (20 mg total) by mouth daily.    Marland Kitchen COVID-19 Education: The signs and symptoms of COVID-19 were discussed with the patient and how to seek care for testing if needed. The importance of social  distancing was discussed today. . Reviewed expectations re: course of current medical issues. . Discussed self-management of symptoms. . Outlined signs and symptoms indicating need for more acute intervention. . Patient verbalized understanding and all questions were answered. Marland Kitchen Health Maintenance issues including appropriate healthy diet, exercise, and smoking avoidance were discussed with patient. . See orders for this visit as documented in the electronic medical record.  Briscoe Deutscher, DO  Records requested if needed. Time spent: 15 minutes, of which >50% was spent in obtaining information about her symptoms, reviewing her previous labs, evaluations, and treatments, counseling her about her condition (please see the discussed topics above), and developing a plan to further investigate it; she had a number of questions which I addressed.

## 2018-09-08 ENCOUNTER — Encounter: Payer: Self-pay | Admitting: Family Medicine

## 2018-09-11 ENCOUNTER — Ambulatory Visit: Payer: Self-pay

## 2018-09-11 DIAGNOSIS — Z20822 Contact with and (suspected) exposure to covid-19: Secondary | ICD-10-CM

## 2018-09-11 DIAGNOSIS — R079 Chest pain, unspecified: Secondary | ICD-10-CM

## 2018-09-11 DIAGNOSIS — I10 Essential (primary) hypertension: Secondary | ICD-10-CM

## 2018-09-11 DIAGNOSIS — F331 Major depressive disorder, recurrent, moderate: Secondary | ICD-10-CM | POA: Diagnosis not present

## 2018-09-11 MED ORDER — ALPRAZOLAM 0.5 MG PO TABS: 0.5000 mg | ORAL_TABLET | Freq: Two times a day (BID) | ORAL | 0 refills | Status: DC | PRN

## 2018-09-11 NOTE — Telephone Encounter (Signed)
Spoke with patient, she is agreeable to referral to cardiology. Referral has been placed. Lab order faxed to [facility] will pick up rx for Alprazolam.

## 2018-09-11 NOTE — Telephone Encounter (Signed)
Xanax sent to the pharmacy for anxiety, could also help with chest. Please put in urgent referral for Cardiology if patient okay with it.

## 2018-09-11 NOTE — Progress Notes (Signed)
ERROR

## 2018-09-11 NOTE — Telephone Encounter (Signed)
Called and spoke with patient. She was seen 5/28 by Dr. Juleen China and she added Lisinopril for her BP. Her BP has still been running high and this is causing her to have more anxiety. Today BP was 171/111. She denies HA, visual change, SOB. She does report occasional fluttering in her chest.   She does note some CP but contributes this to COVID-19. She was seen by our office in March and was told that she likely had COVID-19 based off symptoms, not testing was done at that time. She is requesting something for the "inflammation" in her chest. She says that its not really pain or tightness just "inflammation". She is unable to elaborate. She c/o having to clear her throat often. Also abd bloating which is worse after eating.   D/t the anxiety she is experiencing with her elevated BP, chest fluttering, and concerns about COVID-19, she is requesting that something be called in for her anxiety.   She would have discussed this with Dr. Juleen China at her visit on 5/28 but she was not adequately prepared. She is hoping that Dr. Juleen China will be willing to advise her regarding her elevated BP as well as send in a medication for her chest inflammation and anxiety without having to be seen again.   Forwarding to Dr. Juleen China.

## 2018-09-11 NOTE — Addendum Note (Signed)
Addended by: Jasper Loser on: 09/11/2018 12:59 PM   Modules accepted: Orders

## 2018-09-11 NOTE — Telephone Encounter (Signed)
Referral and lab pending. Called pt and left VM to call the office.

## 2018-09-11 NOTE — Addendum Note (Signed)
Addended by: Briscoe Deutscher R on: 09/11/2018 10:48 AM   Modules accepted: Orders

## 2018-09-11 NOTE — Telephone Encounter (Signed)
Pt called stating that Thursday she had a telephone call from dr Juleen China but she was unprepared to ask questions.  Today she is having high BP 171/111 yesterday and today 171/107 HR 101. Yesterday she states her heart was fluttering but denies this symptom today. She states that she is very anxious because she has had COVID-19 dx which has cost her money for ER visits/doctors visits.  She states that she wants an anti inflammatory for her COVID-19 symptoms. She would like to be tested for COVID-19. See states that she also want s an anxiety medication. These are all things that she was unable to ask on last weeks call. Care advice read to patient. Patient verbalized understanding. Call transferred to office for scheduling.  Reason for Disposition . [1] Systolic BP  >= 504 OR Diastolic >= 136  AND [4] having NO cardiac or neurologic symptoms  Answer Assessment - Initial Assessment Questions 1. BLOOD PRESSURE: "What is the blood pressure?" "Did you take at least two measurements 5 minutes apart?"     171/111 yesterday , 171/107 HR 101 2. ONSET: "When did you take your blood pressure?"     yesterday 3. HOW: "How did you obtain the blood pressure?" (e.g., visiting nurse, automatic home BP monitor)     automatic 4. HISTORY: "Do you have a history of high blood pressure?"    yes 5. MEDICATIONS: "Are you taking any medications for blood pressure?" "Have you missed any doses recently?"    Medications changed 6. OTHER SYMPTOMS: "Do you have any symptoms?" (e.g., headache, chest pain, blurred vision, difficulty breathing, weakness)    No, anxiety 7. PREGNANCY: "Is there any chance you are pregnant?" "When was your last menstrual period?"     No  Protocols used: HIGH BLOOD PRESSURE-A-AH

## 2018-09-11 NOTE — Telephone Encounter (Signed)
See note

## 2018-09-11 NOTE — Addendum Note (Signed)
Addended by: Jasper Loser on: 09/11/2018 01:17 PM   Modules accepted: Orders

## 2018-09-12 ENCOUNTER — Other Ambulatory Visit: Payer: Self-pay

## 2018-09-12 ENCOUNTER — Encounter: Payer: BLUE CROSS/BLUE SHIELD | Admitting: Family Medicine

## 2018-09-13 ENCOUNTER — Encounter: Payer: Self-pay | Admitting: Family Medicine

## 2018-09-13 ENCOUNTER — Telehealth: Payer: Self-pay | Admitting: Family Medicine

## 2018-09-13 NOTE — Telephone Encounter (Signed)
Called pt and clarified that we fax the order to [facility] but we do not schedule the appointment. Advised pt that order had been faxed to [facility] Monday and she can contact [facility] directly to schedule appointment. Apologized for any miscommunication. Pt verbalized understanding.

## 2018-09-13 NOTE — Telephone Encounter (Signed)
Called pt and clarified that we fax the order to [facility] but we do not schedule the appointment. Advised pt that order had been faxed to [facility] Monday and she can contact [facility] directly to schedule appointment. Apologized for any miscommunication. Pt verbalized understanding.

## 2018-09-13 NOTE — Telephone Encounter (Signed)
Copied from [place] [id number]. Topic: General - Call Back - No Documentation >> Sep 12, 2018  4:27 PM Erick Blinks wrote: Reason for CRM: Pt is calling to request call back from [facility] regarding [facility] appt. Please advise  [phone]

## 2018-09-13 NOTE — Telephone Encounter (Signed)
Pt called to follow up on appt Erica [name] is supposed to be making for her at [facility] for Covid- 19 antibody test. Please advise. Requesting callback.

## 2018-09-19 ENCOUNTER — Telehealth: Payer: Self-pay | Admitting: Gastroenterology

## 2018-09-19 DIAGNOSIS — F331 Major depressive disorder, recurrent, moderate: Secondary | ICD-10-CM | POA: Diagnosis not present

## 2018-09-19 NOTE — Telephone Encounter (Signed)
Left message for pt to call back  °

## 2018-09-19 NOTE — Telephone Encounter (Signed)
Pt states Dr. Loletha Carrow had told her that she could stop all GI meds. Pt calling wanting to know if this includes the Nexium. Please advise.

## 2018-09-20 NOTE — Telephone Encounter (Signed)
Yes, I feel she can stop the Nexium.  My recommendation is to decrease it to every other day for a week and then stop.

## 2018-09-20 NOTE — Telephone Encounter (Signed)
Spoke with pt and she is aware.

## 2018-09-21 NOTE — Progress Notes (Signed)
Per pt she is here due to blood pressure. Per pt her Blood pressure is constantly high. Per pt she is not having any headaches or dizziness. Per pt she went to the ER May 20th and the EKG was normal. Per pt the last time they said it was GERD and when she did an Endoscopy they said they did not see anything. Per pt her Mother had BP issue, Aunts on both parents side.   Per pt she took her BP med around 8am

## 2018-09-22 ENCOUNTER — Other Ambulatory Visit: Payer: Self-pay

## 2018-09-22 ENCOUNTER — Encounter: Payer: Self-pay | Admitting: Family Medicine

## 2018-09-22 ENCOUNTER — Ambulatory Visit: Payer: BC Managed Care – PPO | Attending: Family Medicine | Admitting: Family Medicine

## 2018-09-22 ENCOUNTER — Telehealth: Payer: Self-pay

## 2018-09-22 ENCOUNTER — Other Ambulatory Visit: Payer: Self-pay | Admitting: Family Medicine

## 2018-09-22 VITALS — BP 160/100 | HR 73 | Temp 98.6°F | Ht 67.0 in | Wt 166.2 lb

## 2018-09-22 DIAGNOSIS — I10 Essential (primary) hypertension: Secondary | ICD-10-CM

## 2018-09-22 DIAGNOSIS — Z803 Family history of malignant neoplasm of breast: Secondary | ICD-10-CM | POA: Diagnosis not present

## 2018-09-22 DIAGNOSIS — R0789 Other chest pain: Secondary | ICD-10-CM | POA: Diagnosis not present

## 2018-09-22 DIAGNOSIS — F329 Major depressive disorder, single episode, unspecified: Secondary | ICD-10-CM | POA: Insufficient documentation

## 2018-09-22 DIAGNOSIS — K219 Gastro-esophageal reflux disease without esophagitis: Secondary | ICD-10-CM | POA: Diagnosis not present

## 2018-09-22 DIAGNOSIS — R194 Change in bowel habit: Secondary | ICD-10-CM | POA: Diagnosis not present

## 2018-09-22 DIAGNOSIS — D1803 Hemangioma of intra-abdominal structures: Secondary | ICD-10-CM | POA: Diagnosis not present

## 2018-09-22 DIAGNOSIS — Z800 Family history of malignant neoplasm of digestive organs: Secondary | ICD-10-CM | POA: Insufficient documentation

## 2018-09-22 DIAGNOSIS — Z807 Family history of other malignant neoplasms of lymphoid, hematopoietic and related tissues: Secondary | ICD-10-CM | POA: Insufficient documentation

## 2018-09-22 DIAGNOSIS — R002 Palpitations: Secondary | ICD-10-CM | POA: Diagnosis not present

## 2018-09-22 DIAGNOSIS — R140 Abdominal distension (gaseous): Secondary | ICD-10-CM | POA: Diagnosis not present

## 2018-09-22 DIAGNOSIS — R072 Precordial pain: Secondary | ICD-10-CM | POA: Diagnosis not present

## 2018-09-22 DIAGNOSIS — R6889 Other general symptoms and signs: Secondary | ICD-10-CM | POA: Diagnosis not present

## 2018-09-22 DIAGNOSIS — K5900 Constipation, unspecified: Secondary | ICD-10-CM

## 2018-09-22 MED ORDER — AMLODIPINE BESYLATE 5 MG PO TABS: 5.0000 mg | ORAL_TABLET | Freq: Every day | ORAL | 3 refills | Status: DC

## 2018-09-22 NOTE — Progress Notes (Signed)
Subjective:  Patient ID: Erica [name], female    DOB: 11-Oct-1965  Age: 53 y.o. MRN: [id number]  CC: Hypertension and New Patient (Initial Visit)   HPI SHAILY LIBRIZZI presents to establish care in follow-up of her blood pressure which has not been well controlled, recurrent substernal chest pain, recent onset of constipation, abdominal bloating, sensation of palpitations, and patient with recent abnormal CT scan which she was told showed a giant benign mass in her liver.  Patient reports that she initially went to her primary care physician in March with complaint of central/substernal chest pressure and was told that she had COVID-19.  She saw another physician in the same practice who also told her that she had COVID-19 but she states that this person told her she had COVID-19 before ever checking her for any symptoms.  She has never had any cough, shortness of breath or fever and no body aches, just the substernal chest pressure.  Patient states that the only thing so far that has relieved the substernal chest pressure was when she was given a mixture of Mylanta and lidocaine in the emergency department.  Patient states that she is currently taking Nexium for possible acid reflux but also had an EGD which she was told was normal.  She is currently on lisinopril 20 mg for her blood pressure and she states that she took the blood pressure medication earlier today at about 8 AM.  She reports that when she checks her home blood pressure it has been as high as 180 for the top number.  She believes that she is having some anxiety due to concerns about her health as well as her blood pressure.  She admits that she did exercise just before taking her blood pressure when it was 180.  She has however noticed that her home blood pressure is staying elevated around 150-160.  She has not had any distinct left-sided chest pain just the substernal pressure or burning sensation.  She does have family history of breast  cancer as well as maternal grandfather with colon cancer and has siblings with diabetes.  Patient's mother passed away secondary to Hodgkin's lymphoma.      Patient states that she was referred to cardiology due to her hypertension.  She believes that she was initially placed on hydrochlorothiazide which did not agree with her and made her feel funny.  She was then on lisinopril 10 mg which was increased to 20 mg.  She denies any cough associated with the use of lisinopril.  She however states that she just does not feel well.  Patient has been fatigued.  She has had occasional headaches which she thinks is related to her blood pressure.  She did have one episode of right upper posterior shoulder/back pain associated with her substernal chest pain.  She denies any nausea, she does have occasional burping/belching but this is rare.  She has also had new onset of constipation.  She denies any ribbon thin stools and no blood in the stools or black stools.  She feels as if it takes a while for food to transmit through her GI tract.  She denies any sore throat, throat pain or difficulty swallowing.  She has had no peripheral edema.  No increased thirst, no urinary frequency other than due to the fact that she drinks a lot of water.  She is also greatly increased her intake of fresh fruits and vegetables to help with her constipation.  She denies any  loss of appetite but states that she is afraid to eat a lot as she feels that this might affect her blood pressure.  She is not sure when she had her last pelvic exam.  She is postmenopausal.  She has had a history of embolization procedure to help stop her periods.  She does however often feel as if she is having lower abdominal bloating.  Past Medical History:  Diagnosis Date  . Anemia   . Depression   . Essential hypertension, benign   . Fibroids   . GERD (gastroesophageal reflux disease)   . HH (hiatus hernia)   . Hx of adenomatous colonic polyps   . IBS  (irritable bowel syndrome)   . Osteopenia 10/2016  . SUI (stress urinary incontinence, female) 09/29/2016    Past Surgical History:  Procedure Laterality Date  . COLONOSCOPY  01/01/2005   Normal   . POLYPECTOMY    . UTERINE FIBROID EMBOLIZATION      Family History  Problem Relation Age of Onset  . Hodgkin's lymphoma Mother   . Alzheimer's disease Father   . Colon cancer Maternal Grandfather   . Stroke Paternal Grandfather   . Breast cancer Sister   . Breast cancer Maternal Aunt   . Esophageal cancer Neg Hx   . Stomach cancer Neg Hx   . Rectal cancer Neg Hx     Social History   Tobacco Use  . Smoking status: Never Smoker  . Smokeless tobacco: Never Used  Substance Use Topics  . Alcohol use: No    Alcohol/week: 0.0 standard drinks    Comment: occ    ROS Review of Systems  Constitutional: Positive for fatigue. Negative for chills and fever.  HENT: Negative for sore throat and trouble swallowing.   Eyes: Negative for photophobia and visual disturbance.  Respiratory: Negative for cough and shortness of breath.   Cardiovascular: Positive for chest pain and palpitations. Negative for leg swelling.  Gastrointestinal: Positive for constipation. Negative for abdominal pain, blood in stool, diarrhea, nausea, rectal pain and vomiting.  Endocrine: Negative for cold intolerance, heat intolerance, polydipsia, polyphagia and polyuria.  Musculoskeletal: Negative for arthralgias, back pain, gait problem, joint swelling, myalgias, neck pain and neck stiffness.  Neurological: Positive for headaches. Negative for dizziness and numbness.  Hematological: Negative for adenopathy. Does not bruise/bleed easily.  Psychiatric/Behavioral: Negative for self-injury and suicidal ideas. The patient is nervous/anxious.     Objective:   Today's Vitals: BP (!) 160/100 (BP Location: Right Arm, Patient Position: Sitting, Cuff Size: Normal)   Pulse 73   Temp 98.6 F (37 C) (Oral)   Ht 5\' 7"  (1.702  m)   Wt 166 lb 3.2 oz (75.4 kg)   SpO2 97%   BMI 26.03 kg/m   Physical Exam Vitals signs and nursing note reviewed.  Constitutional:      Appearance: Normal appearance.     Comments: Appears slightly anxious  Neck:     Musculoskeletal: Normal range of motion and neck supple. No neck rigidity or muscular tenderness.     Vascular: No carotid bruit.  Cardiovascular:     Rate and Rhythm: Normal rate. Rhythm irregular.     Comments: Patient sounds as if she has some runs of ectopic beats and some displacement of PMI Pulmonary:     Effort: Pulmonary effort is normal.     Breath sounds: Normal breath sounds.  Abdominal:     General: Bowel sounds are normal. There is distension (Mild lower abdominal distention, no tenderness).  Palpations: Abdomen is soft.     Tenderness: There is no abdominal tenderness. There is no right CVA tenderness, left CVA tenderness, guarding or rebound.  Musculoskeletal:        General: No tenderness or deformity.     Right lower leg: No edema.     Left lower leg: No edema.  Lymphadenopathy:     Cervical: No cervical adenopathy.  Skin:    General: Skin is warm and dry.     Coloration: Skin is not jaundiced.     Findings: No bruising.  Neurological:     General: No focal deficit present.     Mental Status: She is alert and oriented to person, place, and time.     Cranial Nerves: No cranial nerve deficit.  Psychiatric:        Behavior: Behavior normal.        Thought Content: Thought content normal.        Judgment: Judgment normal.     Comments: Slightly anxious     Assessment & Plan:  1. Palpitations EKG with normal sinus rhythm.  Patient however with abnormal exam on auscultation.  Patient has had recent normal CBC.  Will check BMP as patient has had prior hypokalemia.  We will also check T4 and TSH in follow-up of palpitations that she also has had new onset of constipation.  She reports that she does have upcoming cardiology appointment and new  referral placed so the cardiologist will be aware of her palpitations/abnormal cardiac exam and the need for possible Holter monitoring as well as echo - EKG 12-Lead - T4 AND TSH - Basic Metabolic Panel - Ambulatory referral to Cardiology  2. Atypical chest pain She reports that she has had ongoing issues with atypical chest pain which occurs in the center of her chest and is a heaviness/pressure and sometimes burning sensation.  Patient had EKG done at today's visit which showed normal sinus rhythm.  Patient reports that the only time she is gotten relief of her chest pain was after taking combination of Mylanta and lidocaine in the emergency department.  Patient will have antibody test for H. pylori to look for possible organisms that can cause stomach ulcers.  She reports that she has had EGD which she states she was told was normal.  She should also continue the use of omeprazole at this time.  On review of chart, EGD was done 08/21/2018 and no abnormalities were noted on EGD report.  Patient has pending cardiology appointment and will also be referred to gastroenterology but patient prefers a different gastroenterologist.  Patient also with imaging showing a giant hemangioma in the liver for which patient is also being referred to GI to see if further follow-up or any treatment is needed. - EKG 12-Lead - H. pylori antibody, IgG - Ambulatory referral to Cardiology  3. Substernal chest pain Patient with complaint of substernal chest pain/atypical chest pain.  Twelve-lead EKG in the office showed normal sinus rhythm.  Patient however had possible ectopic beats on exam and has complaint of occasional palpitations.  She does have upcoming cardiology appointment and referral also placed but this office of the records will be sent here and also so the cardiology will be aware that patient potentially needs cardiac monitoring and possible echo and follow-up of her atypical chest pain and hypertension. -  EKG 12-Lead - H. pylori antibody, IgG - Ambulatory referral to Cardiology  4. Essential hypertension Blood pressure is elevated at today's visit despite patient's  use of lisinopril 20 mg.  She will continue lisinopril but will also add amlodipine 5 mg once daily to help with blood pressure control.  Patient is to keep her upcoming visit with cardiology - amLODipine (NORVASC) 5 MG tablet; Take 1 tablet (5 mg total) by mouth daily. To lower blood pressure  Dispense: 30 tablet; Refill: 3 - Ambulatory referral to Cardiology  5. Constipation, unspecified constipation type; 6.  Abdominal bloating; 8.  Change in bowel habits She complains of recent issues with change in bowel habits, constipation as well as lower abdominal bloating.  Patient will be scheduled for pelvic ultrasound to assess reproductive system to make sure there is no evidence of ovarian/adnexal cyst or uterine enlargement.  She is also being referred to gastroenterology in follow-up of her constipation.  She is encouraged to continue to increase fluid intake as well as fresh fruits and vegetables/increase fiber in the diet to help with constipation.  Will also check TSH in follow-up of hypertension as well as complaint of palpitations and patient with possible ectopic beats on cardiac exam. - US Pelvic Complete With Transvaginal; Future - Ambulatory referral to Gastroenterology  7. Cavernous hemangioma of liver Patient with recent imaging of the abdomen which showed a cavernous hemangioma of the liver.  Patient is being referred to gastroenterology to discuss if any further treatment is needed versus the need for reimaging.  Patient was advised that if she develops any upper abdominal pain, blood in the stool/black stools or sudden onset of weakness that she should go to the emergency department for further evaluation - Ambulatory referral to Gastroenterology   Outpatient Encounter Medications as of 09/22/2018  Medication Sig  .  ALPRAZolam (XANAX) 0.5 MG tablet Take 1 tablet (0.5 mg total) by mouth 2 (two) times daily as needed for anxiety.  Marland Kitchen BIOTIN PO Take 1 tablet by mouth daily.   Marland Kitchen esomeprazole (NEXIUM) 40 MG capsule Take 1 capsule (40 mg total) by mouth daily.  Marland Kitchen lisinopril (ZESTRIL) 20 MG tablet Take 1 tablet (20 mg total) by mouth daily.  . cholecalciferol (VITAMIN D) 25 MCG (1000 UT) tablet TAKE 1 TABLET BY MOUTH EVERY DAY (Patient not taking: No sig reported)  . COLLAGEN PO Take 1 tablet by mouth daily.   . famotidine (PEPCID AC) 10 MG tablet Take 1 tablet (10 mg total) by mouth 2 (two) times daily. (Patient not taking: Reported on 09/22/2018)  . FeFum-FePoly-FA-B Cmp-C-Biot (INTEGRA PLUS) CAPS TAKE 1 CAPSULE BY MOUTH EVERY DAY (Patient not taking: No sig reported)  . lidocaine (XYLOCAINE) 2 % solution Use as directed 15 mLs in the mouth or throat at bedtime. (Patient not taking: Reported on 09/22/2018)  . lisinopril (PRINIVIL,ZESTRIL) 10 MG tablet TAKE 1 TABLET BY MOUTH EVERY DAY (Patient not taking: No sig reported)  . sucralfate (CARAFATE) 1 g tablet Take 1 tablet (1 g total) by mouth 4 (four) times daily -  with meals and at bedtime. (Patient not taking: Reported on 09/22/2018)  . vitamin B-12 (CYANOCOBALAMIN) 1000 MCG tablet Take 1,000 mcg by mouth daily.   No facility-administered encounter medications on file as of 09/22/2018.    An After Visit Summary was printed and given to the patient.   Follow-up: Return in about 2 weeks (around 10/06/2018) for atypical chest pain and Hypertenstion; keep cardiology follow-up.   Antony Blackbird MD

## 2018-09-22 NOTE — Patient Instructions (Signed)
DASH Eating Plan  DASH stands for "Dietary Approaches to Stop Hypertension." The DASH eating plan is a healthy eating plan that has been shown to reduce high blood pressure (hypertension). It may also reduce your risk for type 2 diabetes, heart disease, and stroke. The DASH eating plan may also help with weight loss.  What are tips for following this plan?    General guidelines   Avoid eating more than 2,300 mg (milligrams) of salt (sodium) a day. If you have hypertension, you may need to reduce your sodium intake to 1,500 mg a day.   Limit alcohol intake to no more than 1 drink a day for nonpregnant women and 2 drinks a day for men. One drink equals 12 oz of beer, 5 oz of wine, or 1 oz of hard liquor.   Work with your health care provider to maintain a healthy body weight or to lose weight. Ask what an ideal weight is for you.   Get at least 30 minutes of exercise that causes your heart to beat faster (aerobic exercise) most days of the week. Activities may include walking, swimming, or biking.   Work with your health care provider or diet and nutrition specialist (dietitian) to adjust your eating plan to your individual calorie needs.  Reading food labels     Check food labels for the amount of sodium per serving. Choose foods with less than 5 percent of the Daily Value of sodium. Generally, foods with less than 300 mg of sodium per serving fit into this eating plan.   To find whole grains, look for the word "whole" as the first word in the ingredient list.  Shopping   Buy products labeled as "low-sodium" or "no salt added."   Buy fresh foods. Avoid canned foods and premade or frozen meals.  Cooking   Avoid adding salt when cooking. Use salt-free seasonings or herbs instead of table salt or sea salt. Check with your health care provider or pharmacist before using salt substitutes.   Do not fry foods. Cook foods using healthy methods such as baking, boiling, grilling, and broiling instead.   Cook with  heart-healthy oils, such as olive, canola, soybean, or sunflower oil.  Meal planning   Eat a balanced diet that includes:  ? 5 or more servings of fruits and vegetables each day. At each meal, try to fill half of your plate with fruits and vegetables.  ? Up to 6-8 servings of whole grains each day.  ? Less than 6 oz of lean meat, poultry, or fish each day. A 3-oz serving of meat is about the same size as a deck of cards. One egg equals 1 oz.  ? 2 servings of low-fat dairy each day.  ? A serving of nuts, seeds, or beans 5 times each week.  ? Heart-healthy fats. Healthy fats called Omega-3 fatty acids are found in foods such as flaxseeds and coldwater fish, like sardines, salmon, and mackerel.   Limit how much you eat of the following:  ? Canned or prepackaged foods.  ? Food that is high in trans fat, such as fried foods.  ? Food that is high in saturated fat, such as fatty meat.  ? Sweets, desserts, sugary drinks, and other foods with added sugar.  ? Full-fat dairy products.   Do not salt foods before eating.   Try to eat at least 2 vegetarian meals each week.   Eat more home-cooked food and less restaurant, buffet, and fast food.     When eating at a restaurant, ask that your food be prepared with less salt or no salt, if possible.  What foods are recommended?  The items listed may not be a complete list. Talk with your dietitian about what dietary choices are best for you.  Grains  Whole-grain or whole-wheat bread. Whole-grain or whole-wheat pasta. Brown rice. Oatmeal. Quinoa. Bulgur. Whole-grain and low-sodium cereals. Pita bread. Low-fat, low-sodium crackers. Whole-wheat flour tortillas.  Vegetables  Fresh or frozen vegetables (raw, steamed, roasted, or grilled). Low-sodium or reduced-sodium tomato and vegetable juice. Low-sodium or reduced-sodium tomato sauce and tomato paste. Low-sodium or reduced-sodium canned vegetables.  Fruits  All fresh, dried, or frozen fruit. Canned fruit in natural juice (without  added sugar).  Meat and other protein foods  Skinless chicken or turkey. Ground chicken or turkey. Pork with fat trimmed off. Fish and seafood. Egg whites. Dried beans, peas, or lentils. Unsalted nuts, nut butters, and seeds. Unsalted canned beans. Lean cuts of beef with fat trimmed off. Low-sodium, lean deli meat.  Dairy  Low-fat (1%) or fat-free (skim) milk. Fat-free, low-fat, or reduced-fat cheeses. Nonfat, low-sodium ricotta or cottage cheese. Low-fat or nonfat yogurt. Low-fat, low-sodium cheese.  Fats and oils  Soft margarine without trans fats. Vegetable oil. Low-fat, reduced-fat, or light mayonnaise and salad dressings (reduced-sodium). Canola, safflower, olive, soybean, and sunflower oils. Avocado.  Seasoning and other foods  Herbs. Spices. Seasoning mixes without salt. Unsalted popcorn and pretzels. Fat-free sweets.  What foods are not recommended?  The items listed may not be a complete list. Talk with your dietitian about what dietary choices are best for you.  Grains  Baked goods made with fat, such as croissants, muffins, or some breads. Dry pasta or rice meal packs.  Vegetables  Creamed or fried vegetables. Vegetables in a cheese sauce. Regular canned vegetables (not low-sodium or reduced-sodium). Regular canned tomato sauce and paste (not low-sodium or reduced-sodium). Regular tomato and vegetable juice (not low-sodium or reduced-sodium). Pickles. Olives.  Fruits  Canned fruit in a light or heavy syrup. Fried fruit. Fruit in cream or butter sauce.  Meat and other protein foods  Fatty cuts of meat. Ribs. Fried meat. Bacon. Sausage. Bologna and other processed lunch meats. Salami. Fatback. Hotdogs. Bratwurst. Salted nuts and seeds. Canned beans with added salt. Canned or smoked fish. Whole eggs or egg yolks. Chicken or turkey with skin.  Dairy  Whole or 2% milk, cream, and half-and-half. Whole or full-fat cream cheese. Whole-fat or sweetened yogurt. Full-fat cheese. Nondairy creamers. Whipped toppings.  Processed cheese and cheese spreads.  Fats and oils  Butter. Stick margarine. Lard. Shortening. Ghee. Bacon fat. Tropical oils, such as coconut, palm kernel, or palm oil.  Seasoning and other foods  Salted popcorn and pretzels. Onion salt, garlic salt, seasoned salt, table salt, and sea salt. Worcestershire sauce. Tartar sauce. Barbecue sauce. Teriyaki sauce. Soy sauce, including reduced-sodium. Steak sauce. Canned and packaged gravies. Fish sauce. Oyster sauce. Cocktail sauce. Horseradish that you find on the shelf. Ketchup. Mustard. Meat flavorings and tenderizers. Bouillon cubes. Hot sauce and Tabasco sauce. Premade or packaged marinades. Premade or packaged taco seasonings. Relishes. Regular salad dressings.  Where to find more information:   National Heart, Lung, and Blood Institute: www.nhlbi.nih.gov   American Heart Association: www.heart.org  Summary   The DASH eating plan is a healthy eating plan that has been shown to reduce high blood pressure (hypertension). It may also reduce your risk for type 2 diabetes, heart disease, and stroke.   With the   DASH eating plan, you should limit salt (sodium) intake to 2,300 mg a day. If you have hypertension, you may need to reduce your sodium intake to 1,500 mg a day.   When on the DASH eating plan, aim to eat more fresh fruits and vegetables, whole grains, lean proteins, low-fat dairy, and heart-healthy fats.   Work with your health care provider or diet and nutrition specialist (dietitian) to adjust your eating plan to your individual calorie needs.  This information is not intended to replace advice given to you by your health care provider. Make sure you discuss any questions you have with your health care provider.  Document Released: 03/18/2011 Document Revised: 03/22/2016 Document Reviewed: 03/22/2016  Elsevier Interactive Patient Education  2019 Elsevier Inc.

## 2018-09-22 NOTE — Telephone Encounter (Signed)
Virtual Visit Pre-Appointment Phone Call TELEPHONE CALL NOTE  Erica [name] has been deemed a candidate for a follow-up tele-health visit to limit community exposure during the Covid-19 pandemic. I spoke with the patient via phone to ensure availability of phone/video source, confirm preferred email & phone number, and discuss instructions and expectations.  I reminded Erica [name] to be prepared with any vital sign and/or heart rhythm information that could potentially be obtained via home monitoring, at the time of her visit. I reminded Erica [name] to expect a phone call prior to her visit.  Patient agrees to consent below.  Cleon Gustin, RN 09/22/2018 3:38 PM  FULL LENGTH CONSENT FOR TELE-HEALTH VISIT   I hereby voluntarily request, consent and authorize [facility] and its employed or contracted physicians, physician assistants, nurse practitioners or other licensed health care professionals (the Practitioner), to provide me with telemedicine health care services (the "Services") as deemed necessary by the treating Practitioner. I acknowledge and consent to receive the Services by the Practitioner via telemedicine. I understand that the telemedicine visit will involve communicating with the Practitioner through live audiovisual communication technology and the disclosure of certain medical information by electronic transmission. I acknowledge that I have been given the opportunity to request an in-person assessment or other available alternative prior to the telemedicine visit and am voluntarily participating in the telemedicine visit.  I understand that I have the right to withhold or withdraw my consent to the use of telemedicine in the course of my care at any time, without affecting my right to future care or treatment, and that the Practitioner or I may terminate the telemedicine visit at any time. I understand that I have the right to inspect all information obtained  and/or recorded in the course of the telemedicine visit and may receive copies of available information for a reasonable fee.  I understand that some of the potential risks of receiving the Services via telemedicine include:  Marland Kitchen Delay or interruption in medical evaluation due to technological equipment failure or disruption; . Information transmitted may not be sufficient (e.g. poor resolution of images) to allow for appropriate medical decision making by the Practitioner; and/or  . In rare instances, security protocols could fail, causing a breach of personal health information.  Furthermore, I acknowledge that it is my responsibility to provide information about my medical history, conditions and care that is complete and accurate to the best of my ability. I acknowledge that Practitioner's advice, recommendations, and/or decision may be based on factors not within their control, such as incomplete or inaccurate data provided by me or distortions of diagnostic images or specimens that may result from electronic transmissions. I understand that the practice of medicine is not an exact science and that Practitioner makes no warranties or guarantees regarding treatment outcomes. I acknowledge that I will receive a copy of this consent concurrently upon execution via email to the email address I last provided but may also request a printed copy by calling the office of [place].    I understand that my insurance will be billed for this visit.   I have read or had this consent read to me. . I understand the contents of this consent, which adequately explains the benefits and risks of the Services being provided via telemedicine.  . I have been provided ample opportunity to ask questions regarding this consent and the Services and have had my questions answered to my satisfaction. . I give my  informed consent for the services to be provided through the use of telemedicine in my medical care  By  participating in this telemedicine visit I agree to the above.

## 2018-09-23 LAB — SAR COV2 SEROLOGY (COVID19)AB(IGG),IA: SARS-CoV-2 Ab, IgG: NEGATIVE

## 2018-09-25 LAB — BASIC METABOLIC PANEL WITH GFR
BUN/Creatinine Ratio: 10 (ref 9–23)
BUN: 9 mg/dL (ref 6–24)
CO2: 24 mmol/L (ref 20–29)
Calcium: 10.4 mg/dL — ABNORMAL HIGH (ref 8.7–10.2)
Chloride: 101 mmol/L (ref 96–106)
Creatinine, Ser: 0.92 mg/dL (ref 0.57–1.00)
GFR calc Af Amer: 82 mL/min/1.73
GFR calc non Af Amer: 71 mL/min/1.73
Glucose: 96 mg/dL (ref 65–99)
Potassium: 4.1 mmol/L (ref 3.5–5.2)
Sodium: 141 mmol/L (ref 134–144)

## 2018-09-25 LAB — H. PYLORI ANTIBODY, IGG: H. pylori, IgG AbS: 0.34 {index_val} (ref 0.00–0.79)

## 2018-09-25 LAB — T4 AND TSH
T4, Total: 8.5 ug/dL (ref 4.5–12.0)
TSH: 0.494 u[IU]/mL (ref 0.450–4.500)

## 2018-09-26 ENCOUNTER — Telehealth: Payer: Self-pay | Admitting: Family Medicine

## 2018-09-26 NOTE — Telephone Encounter (Signed)
Patient called stating she would like her results and would like it left in a voice message if possible. Please follow up

## 2018-09-26 NOTE — Telephone Encounter (Signed)
Patient contacted via phone to be given results of labs.  Patient identified by name and date of birth.  Patient given results of labs.  Patient educated on lab results. Questions answered. Patient acknowledged understanding of labs results. 

## 2018-09-28 DIAGNOSIS — F331 Major depressive disorder, recurrent, moderate: Secondary | ICD-10-CM | POA: Diagnosis not present

## 2018-09-29 ENCOUNTER — Ambulatory Visit: Payer: BLUE CROSS/BLUE SHIELD | Admitting: Family Medicine

## 2018-09-29 ENCOUNTER — Telehealth: Payer: Self-pay | Admitting: *Deleted

## 2018-09-29 NOTE — Telephone Encounter (Signed)
Called [employer] to get Prior Auth for patient Ultrasound of the Pelvic. Spoke with Darius and he stated per patient plan patient did not need a Prior Auth for this service CPT code [phone] and the case number is [id number]. Called scheduling and scheduled patient   Ultrasound for Wednesday June 24th at 2:30pm and patient need to have a full bladder.  Ultrasound will be completed at [facility] and patient need to go to the main entrance and go to the Radiology department on the first floor.   Staff called patient and informed her with information and patient stated that she have an appt with another doctor that day but 30 min before. Patient stated she will like to resch her appt. Staff informed patient that she can call the schedulers back and resch that appt with them. Per patient she is driving and staff informed her to call office back for scheduling number which is [phone]. Patient verbalized understanding and stated she will call back for that number so she can resch that appt.

## 2018-10-01 ENCOUNTER — Other Ambulatory Visit: Payer: Self-pay | Admitting: Family Medicine

## 2018-10-01 DIAGNOSIS — R1013 Epigastric pain: Secondary | ICD-10-CM

## 2018-10-02 ENCOUNTER — Telehealth: Payer: Self-pay | Admitting: Cardiovascular Disease

## 2018-10-02 NOTE — Telephone Encounter (Signed)

## 2018-10-03 ENCOUNTER — Other Ambulatory Visit: Payer: Self-pay

## 2018-10-03 ENCOUNTER — Encounter: Payer: Self-pay | Admitting: Cardiovascular Disease

## 2018-10-03 ENCOUNTER — Ambulatory Visit (INDEPENDENT_AMBULATORY_CARE_PROVIDER_SITE_OTHER): Payer: BC Managed Care – PPO | Admitting: Cardiovascular Disease

## 2018-10-03 VITALS — BP 112/78 | HR 84 | Ht 67.0 in | Wt 162.8 lb

## 2018-10-03 DIAGNOSIS — R0789 Other chest pain: Secondary | ICD-10-CM | POA: Diagnosis not present

## 2018-10-03 DIAGNOSIS — I10 Essential (primary) hypertension: Secondary | ICD-10-CM

## 2018-10-03 DIAGNOSIS — R079 Chest pain, unspecified: Secondary | ICD-10-CM

## 2018-10-03 HISTORY — DX: Chest pain, unspecified: R07.9

## 2018-10-03 NOTE — Patient Instructions (Signed)
Medication Instructions:  Your provider recommends that you continue on your current medications as directed. Please refer to the Current Medication list given to you today.    Follow-Up: Your provider recommends that you schedule a follow-up appointment AS NEEDED. Please let us know if you need anything!

## 2018-10-03 NOTE — Progress Notes (Signed)
Cardiology Office Note:    Date:  10/03/2018   ID:  [name], DOB 05-Jul-1965, MRN [id number]  PCP:  Erica Blackbird, MD  Cardiologist:  Erica [name]  Electrophysiologist:  None   Referring MD: Erica Deutscher, DO   Chief Complaint  Patient presents with  . Chest Pain    October 03, 2018    Erica [name] is a 53 y.o. female with a hx of HTN.  We are asked to see her for further evaluation of chest pain by Dr. Juleen [name].  Has been having CP since March.  BP has been elevated.  Eating food exacerbates this chest pain .  Worsened by eating meat.   Difficult to swallow, Seems to radiate down left arm . No diaphoresis, or presyncope  Has seen GI,  Had EGD - looked normal Is going to see Dr. Oletta [name] tomorrow . Pain was relieved by GI cocktail   Has stopped eating meat. Has not been exercising .  Worked in collections.    Past Medical History:  Diagnosis Date  . Anemia   . Depression   . Essential hypertension, benign   . Fibroids   . GERD (gastroesophageal reflux disease)   . HH (hiatus hernia)   . Hx of adenomatous colonic polyps   . IBS (irritable bowel syndrome)   . Osteopenia 10/2016  . SUI (stress urinary incontinence, female) 09/29/2016    Past Surgical History:  Procedure Laterality Date  . COLONOSCOPY  01/01/2005   Normal   . POLYPECTOMY    . UTERINE FIBROID EMBOLIZATION      Current Medications: Current Meds  Medication Sig  . ALPRAZolam (XANAX) 0.5 MG tablet Take 1 tablet (0.5 mg total) by mouth 2 (two) times daily as needed for anxiety.  Marland Kitchen amLODipine (NORVASC) 5 MG tablet Take 1 tablet (5 mg total) by mouth daily. To lower blood pressure  . BIOTIN PO Take 1 tablet by mouth daily.   Marland Kitchen esomeprazole (NEXIUM) 40 MG capsule Take 1 capsule (40 mg total) by mouth daily.  Marland Kitchen lisinopril (ZESTRIL) 20 MG tablet Take 1 tablet (20 mg total) by mouth daily.     Allergies:   Patient has no known allergies.   Social History   Socioeconomic History  . Marital  status: Single    Spouse name: Not on file  . Number of children: 0  . Years of education: Not on file  . Highest education level: Not on file  Occupational History  . Occupation: MI service specialist    Employer: [employer]  . Financial resource strain: Not on file  . Food insecurity    Worry: Not on file    Inability: Not on file  . Transportation needs    Medical: Not on file    Non-medical: Not on file  Tobacco Use  . Smoking status: Never Smoker  . Smokeless tobacco: Never Used  Substance and Sexual Activity  . Alcohol use: No    Alcohol/week: 0.0 standard drinks    Comment: occ  . Drug use: No  . Sexual activity: Yes    Birth control/protection: Abstinence  Lifestyle  . Physical activity    Days per week: Not on file    Minutes per session: Not on file  . Stress: Not on file  Relationships  . Social Herbalist on phone: Not on file    Gets together: Not on file    Attends religious service: Not on file  Active member of club or organization: Not on file    Attends meetings of clubs or organizations: Not on file    Relationship status: Not on file  Other Topics Concern  . Not on file  Social History Narrative  . Not on file     Family History: The patient's family history includes Alzheimer's disease in her father; Breast cancer in her maternal aunt and sister; Colon cancer in her maternal grandfather; Hodgkin's lymphoma in her mother; Stroke in her paternal grandfather. There is no history of Esophageal cancer, Stomach cancer, or Rectal cancer.  ROS:   Please see the history of present illness.     All other systems reviewed and are negative.  EKGs/Labs/Other Studies Reviewed:    The following studies were reviewed today:   EKG:    Recent Labs: 08/10/2018: ALT 23; Hemoglobin 13.1; Platelets 187 09/22/2018: BUN 9; Creatinine, Ser 0.92; Potassium 4.1; Sodium 141; TSH 0.494  Recent Lipid Panel    Component Value Date/Time    CHOL 181 05/02/2017 0941   TRIG 56.0 05/02/2017 0941   HDL 64.70 05/02/2017 0941   CHOLHDL 3 05/02/2017 0941   VLDL 11.2 05/02/2017 0941   LDLCALC 105 (H) 05/02/2017 0941    Physical Exam:    VS:  BP 112/78   Pulse 84   Ht 5\' 7"  (1.702 m)   Wt 162 lb 12.8 oz (73.8 kg)   SpO2 93%   BMI 25.50 kg/m     Wt Readings from Last 3 Encounters:  10/03/18 162 lb 12.8 oz (73.8 kg)  09/22/18 166 lb 3.2 oz (75.4 kg)  08/21/18 166 lb (75.3 kg)     GEN:  Middle age female,    HEENT: Normal NECK: No JVD; No carotid bruits LYMPHATICS: No lymphadenopathy CARDIAC: RRR, no murmurs, rubs, gallops RESPIRATORY:  Clear to auscultation without rales, wheezing or rhonchi  ABDOMEN: Soft, non-tender, non-distended MUSCULOSKELETAL:  No edema; No deformity  SKIN: Warm and dry NEUROLOGIC:  Alert and oriented x 3 PSYCHIATRIC:  Normal affect   ASSESSMENT:    1. Essential hypertension   2. Chest pain of uncertain etiology    PLAN:    In order of problems listed above:  1. Chest pain:   Very atypical for cardiac etiology .  Sounds esophageal .   Pain is worsened by the act of eating / swallowing.  She has been on PPI for years.   ? Possible candida overgrowth - although the [facility] pictures did not show any evidence of candida.  She does not need any further cardiac work up at this point  She is seeing Dr. Oletta [name] tomorrow and she will ask about additional treatments for her esophageal pain   2.  HTN:   BP has bee well controlled.   Likely because she has not been eating much.  I've advised her to continue to follow up with Dr. Juleen [name] and to call me if she needs any further advice.    Medication Adjustments/Labs and Tests Ordered: Current medicines are reviewed at length with the patient today.  Concerns regarding medicines are outlined above.  No orders of the defined types were placed in this encounter.  No orders of the defined types were placed in this encounter.   Patient Instructions   Medication Instructions:  Your provider recommends that you continue on your current medications as directed. Please refer to the Current Medication list given to you today.    Follow-Up: Your provider recommends that you schedule a follow-up  appointment AS NEEDED. Please let us know if you need anything!    Signed, Mertie Moores, MD  10/03/2018 5:57 PM    [place]

## 2018-10-04 ENCOUNTER — Ambulatory Visit (HOSPITAL_COMMUNITY)
Admission: RE | Admit: 2018-10-04 | Discharge: 2018-10-04 | Disposition: A | Discharge status: Home / Self Care | Payer: BC Managed Care – PPO | Source: Ambulatory Visit | Attending: Family Medicine | Admitting: Family Medicine

## 2018-10-04 ENCOUNTER — Telehealth: Payer: BLUE CROSS/BLUE SHIELD | Admitting: Cardiovascular Disease

## 2018-10-04 ENCOUNTER — Other Ambulatory Visit: Payer: Self-pay | Admitting: Family Medicine

## 2018-10-04 DIAGNOSIS — K5900 Constipation, unspecified: Secondary | ICD-10-CM | POA: Insufficient documentation

## 2018-10-04 DIAGNOSIS — R194 Change in bowel habit: Secondary | ICD-10-CM | POA: Insufficient documentation

## 2018-10-04 DIAGNOSIS — F419 Anxiety disorder, unspecified: Secondary | ICD-10-CM | POA: Diagnosis not present

## 2018-10-04 DIAGNOSIS — D259 Leiomyoma of uterus, unspecified: Secondary | ICD-10-CM | POA: Diagnosis not present

## 2018-10-04 DIAGNOSIS — R140 Abdominal distension (gaseous): Secondary | ICD-10-CM | POA: Diagnosis not present

## 2018-10-04 DIAGNOSIS — K5909 Other constipation: Secondary | ICD-10-CM | POA: Diagnosis not present

## 2018-10-04 DIAGNOSIS — N393 Stress incontinence (female) (male): Secondary | ICD-10-CM | POA: Diagnosis not present

## 2018-10-05 ENCOUNTER — Telehealth: Payer: Self-pay | Admitting: Family Medicine

## 2018-10-05 ENCOUNTER — Telehealth: Payer: Self-pay | Admitting: Emergency Medicine

## 2018-10-05 ENCOUNTER — Other Ambulatory Visit: Payer: Self-pay

## 2018-10-05 ENCOUNTER — Ambulatory Visit
Admission: RE | Admit: 2018-10-05 | Discharge: 2018-10-05 | Disposition: A | Discharge status: Home / Self Care | Payer: BC Managed Care – PPO | Source: Ambulatory Visit | Attending: Family Medicine | Admitting: Family Medicine

## 2018-10-05 DIAGNOSIS — Z1231 Encounter for screening mammogram for malignant neoplasm of breast: Secondary | ICD-10-CM

## 2018-10-05 NOTE — Telephone Encounter (Signed)
Nurse called the patient's home phone number but received no answer and message was left on the voicemail for the patient to call back.  Return phone number given. 

## 2018-10-05 NOTE — Telephone Encounter (Signed)
Patients call returned.  Patient identified by name and date of birth.  Patient given results of xrays.  Patient was looking for results of second xrays??    Patient also concerned that her xanax prescription was not refilled and she only has one pill left,  Patient acknowledged understanding of advice.

## 2018-10-05 NOTE — Telephone Encounter (Signed)
Follow up   Pt returned your call

## 2018-10-06 ENCOUNTER — Other Ambulatory Visit: Payer: Self-pay | Admitting: Family Medicine

## 2018-10-06 DIAGNOSIS — F418 Other specified anxiety disorders: Secondary | ICD-10-CM

## 2018-10-06 DIAGNOSIS — R4589 Other symptoms and signs involving emotional state: Secondary | ICD-10-CM

## 2018-10-06 MED ORDER — ALPRAZOLAM 0.25 MG PO TABS: 0.2500 mg | ORAL_TABLET | Freq: Two times a day (BID) | ORAL | 0 refills | Status: DC | PRN

## 2018-10-06 NOTE — Progress Notes (Signed)
Patient ID: Erica [name], female   DOB: 10-22-1965, 53 y.o.   MRN: [id number]   Patient left message regarding need for Xanax refill.  Will send in new prescription at a lower strength and also discussed with patient at her upcoming appointment that this medication will likely not be prescribed long-term due to the potential for dependency/addiction and will discuss other medications/options for control of her anxiety

## 2018-10-06 NOTE — Telephone Encounter (Signed)
Please notify patient that a refill was sent in for Xanax but at a lower dose of 0.25 due to potential for addiction/dependence with use of this class of medications.  Please keep follow-up appointment to discuss options for ongoing treatment of her anxiety as well as follow-up of her current health concerns

## 2018-10-06 NOTE — Telephone Encounter (Signed)
Patients call returned.  Patient identified by name and date of birth.  Patient informed of prescription refill and upcoming appintment.  Patient acknowledged understanding of advice.

## 2018-10-09 DIAGNOSIS — K5900 Constipation, unspecified: Secondary | ICD-10-CM | POA: Diagnosis not present

## 2018-10-09 DIAGNOSIS — R079 Chest pain, unspecified: Secondary | ICD-10-CM | POA: Diagnosis not present

## 2018-10-09 DIAGNOSIS — R140 Abdominal distension (gaseous): Secondary | ICD-10-CM | POA: Diagnosis not present

## 2018-10-12 ENCOUNTER — Other Ambulatory Visit: Payer: Self-pay

## 2018-10-12 ENCOUNTER — Ambulatory Visit: Payer: BC Managed Care – PPO | Attending: Family Medicine | Admitting: Family Medicine

## 2018-10-12 ENCOUNTER — Encounter: Payer: Self-pay | Admitting: Family Medicine

## 2018-10-12 VITALS — BP 115/80 | HR 87 | Temp 98.4°F | Ht 67.0 in | Wt 159.2 lb

## 2018-10-12 DIAGNOSIS — K219 Gastro-esophageal reflux disease without esophagitis: Secondary | ICD-10-CM

## 2018-10-12 DIAGNOSIS — J309 Allergic rhinitis, unspecified: Secondary | ICD-10-CM

## 2018-10-12 DIAGNOSIS — I10 Essential (primary) hypertension: Secondary | ICD-10-CM

## 2018-10-12 DIAGNOSIS — F331 Major depressive disorder, recurrent, moderate: Secondary | ICD-10-CM | POA: Diagnosis not present

## 2018-10-12 DIAGNOSIS — R0789 Other chest pain: Secondary | ICD-10-CM | POA: Diagnosis not present

## 2018-10-12 DIAGNOSIS — R002 Palpitations: Secondary | ICD-10-CM | POA: Diagnosis not present

## 2018-10-12 MED ORDER — PANTOPRAZOLE SODIUM 20 MG PO TBEC: 20.0000 mg | DELAYED_RELEASE_TABLET | Freq: Every day | ORAL | 6 refills | Status: DC

## 2018-10-12 MED ORDER — LEVOCETIRIZINE DIHYDROCHLORIDE 5 MG PO TABS: 5.0000 mg | ORAL_TABLET | Freq: Every evening | ORAL | 11 refills | Status: DC

## 2018-10-12 NOTE — Progress Notes (Signed)
Follow up for blood pressure.

## 2018-10-12 NOTE — Patient Instructions (Signed)
Allergic Rhinitis, Adult Allergic rhinitis is a reaction to allergens in the air. Allergens are tiny specks (particles) in the air that cause your body to have an allergic reaction. This condition cannot be passed from person to person (is not contagious). Allergic rhinitis cannot be cured, but it can be controlled. There are two types of allergic rhinitis:  Seasonal. This type is also called hay fever. It happens only during certain times of the year.  Perennial. This type can happen at any time of the year. What are the causes? This condition may be caused by:  Pollen from grasses, trees, and weeds.  House dust mites.  Pet dander.  Mold. What are the signs or symptoms? Symptoms of this condition include:  Sneezing.  Runny or stuffy nose (nasal congestion).  A lot of mucus in the back of the throat (postnasal drip).  Itchy nose.  Tearing of the eyes.  Trouble sleeping.  Being sleepy during day. How is this treated? There is no cure for this condition. You should avoid things that trigger your symptoms (allergens). Treatment can help to relieve symptoms. This may include:  Medicines that block allergy symptoms, such as antihistamines. These may be given as a shot, nasal spray, or pill.  Shots that are given until your body becomes less sensitive to the allergen (desensitization).  Stronger medicines, if all other treatments have not worked. Follow these instructions at home: Avoiding allergens   Find out what you are allergic to. Common allergens include smoke, dust, and pollen.  Avoid them if you can. These are some of the things that you can do to avoid allergens: ? Replace carpet with wood, tile, or vinyl flooring. Carpet can trap dander and dust. ? Clean any mold found in the home. ? Do not smoke. Do not allow smoking in your home. ? Change your heating and air conditioning filter at least once a month. ? During allergy season:  Keep windows closed as much as  you can. If possible, use air conditioning when there is a lot of pollen in the air.  Use a special filter for allergies with your furnace and air conditioner.  Plan outdoor activities when pollen counts are lowest. This is usually during the early morning or evening hours.  If you do go outdoors when pollen count is high, wear a special mask for people with allergies.  When you come indoors, take a shower and change your clothes before sitting on furniture or bedding. General instructions  Do not use fans in your home.  Do not hang clothes outside to dry.  Wear sunglasses to keep pollen out of your eyes.  Wash your hands right away after you touch household pets.  Take over-the-counter and prescription medicines only as told by your doctor.  Keep all follow-up visits as told by your doctor. This is important. Contact a doctor if:  You have a fever.  You have a cough that does not go away (is persistent).  You start to make whistling sounds when you breathe (wheeze).  Your symptoms do not get better with treatment.  You have thick fluid coming from your nose.  You start to have nosebleeds. Get help right away if:  Your tongue or your lips are swollen.  You have trouble breathing.  You feel dizzy or you feel like you are going to pass out (faint).  You have cold sweats. Summary  Allergic rhinitis is a reaction to allergens in the air.  This condition may be   caused by allergens. These include pollen, dust mites, pet dander, and mold.  Symptoms include a runny, itchy nose, sneezing, or tearing eyes. You may also have trouble sleeping or feel sleepy during the day.  Treatment includes taking medicines and avoiding allergens. You may also get shots or take stronger medicines.  Get help if you have a fever or a cough that does not stop. Get help right away if you are short of breath. This information is not intended to replace advice given to you by your health care  provider. Make sure you discuss any questions you have with your health care provider. Document Released: 07/29/2010 Document Revised: 07/18/2018 Document Reviewed: 10/18/2017 Elsevier Patient Education  2020 [employer] for Gastroesophageal Reflux Disease, Adult When you have gastroesophageal reflux disease (GERD), the foods you eat and your eating habits are very important. Choosing the right foods can help ease the discomfort of GERD. Consider working with a diet and nutrition specialist (dietitian) to help you make healthy food choices. What general guidelines should I follow?  Eating plan  Choose healthy foods low in fat, such as fruits, vegetables, whole grains, low-fat dairy products, and lean meat, fish, and poultry.  Eat frequent, small meals instead of three large meals each day. Eat your meals slowly, in a relaxed setting. Avoid bending over or lying down until 2-3 hours after eating.  Limit high-fat foods such as fatty meats or fried foods.  Limit your intake of oils, butter, and shortening to less than 8 teaspoons each day.  Avoid the following: ? Foods that cause symptoms. These may be different for different people. Keep a food diary to keep track of foods that cause symptoms. ? Alcohol. ? Drinking large amounts of liquid with meals. ? Eating meals during the 2-3 hours before bed.  Cook foods using methods other than frying. This may include baking, grilling, or broiling. Lifestyle  Maintain a healthy weight. Ask your health care provider what weight is healthy for you. If you need to lose weight, work with your health care provider to do so safely.  Exercise for at least 30 minutes on 5 or more days each week, or as told by your health care provider.  Avoid wearing clothes that fit tightly around your waist and chest.  Do not use any products that contain nicotine or tobacco, such as cigarettes and e-cigarettes. If you need help quitting, ask your  health care provider.  Sleep with the head of your bed raised. Use a wedge under the mattress or blocks under the bed frame to raise the head of the bed. What foods are not recommended? The items listed may not be a complete list. Talk with your dietitian about what dietary choices are best for you. Grains Pastries or quick breads with added fat. [place] toast. Vegetables Deep fried vegetables. [place] fries. Any vegetables prepared with added fat. Any vegetables that cause symptoms. For some people this may include tomatoes and tomato products, chili peppers, onions and garlic, and horseradish. Fruits Any fruits prepared with added fat. Any fruits that cause symptoms. For some people this may include citrus fruits, such as oranges, grapefruit, pineapple, and lemons. Meats and other protein foods High-fat meats, such as fatty beef or pork, hot dogs, ribs, ham, sausage, salami and bacon. Fried meat or protein, including fried fish and fried chicken. Nuts and nut butters. Dairy Whole milk and chocolate milk. Sour cream. Cream. Ice cream. Cream cheese. Milk shakes. Beverages Coffee and tea,  with or without caffeine. Carbonated beverages. Sodas. Energy drinks. Fruit juice made with acidic fruits (such as orange or grapefruit). Tomato juice. Alcoholic drinks. Fats and oils Butter. Margarine. Shortening. Ghee. Sweets and desserts Chocolate and cocoa. Donuts. Seasoning and other foods Pepper. Peppermint and spearmint. Any condiments, herbs, or seasonings that cause symptoms. For some people, this may include curry, hot sauce, or vinegar-based salad dressings. Summary  When you have gastroesophageal reflux disease (GERD), food and lifestyle choices are very important to help ease the discomfort of GERD.  Eat frequent, small meals instead of three large meals each day. Eat your meals slowly, in a relaxed setting. Avoid bending over or lying down until 2-3 hours after eating.  Limit high-fat foods  such as fatty meat or fried foods. This information is not intended to replace advice given to you by your health care provider. Make sure you discuss any questions you have with your health care provider. Document Released: 03/29/2005 Document Revised: 07/20/2018 Document Reviewed: 03/30/2016 Elsevier Patient Education  2020 [employer].

## 2018-10-12 NOTE — Progress Notes (Signed)
Established Patient Office Visit  Subjective:  Patient ID: Erica [name], female    DOB: 1965-12-05  Age: 53 y.o. MRN: [id number]  CC:  Chief Complaint  Patient presents with   Follow-up    HPI Erica [name] presents for follow-up of atypical chest pain and hypertension along with anxiety. She reports that she has seen GI since her last visit and was placed on Miralax for constipation and she is having regular bowel movements and her stomach feels better. She is also not having the chest pain that she was having before and only occasional sensation of palpitations.  She did see the cardiologist who thought that her chest pain was more likely GI based.  Per patient he wondered if she may have some overgrowth of yeast in her esophagus but this was not seen when she had her EGD.  Patient states that she was told that she does not need to have any additional cardiac work-up.  She also states that she has been taking her blood pressure medicine and has had no headaches or dizziness related to her blood pressure.        She continues to have some issues with anxiety but states that this is improving.  She had called recently to get a refill on alprazolam and nursing had contacted her to let her know that a lower dose have been sent and and that would discuss tapering medication and a possible change in medication due to the potential for addiction and dependence with the use of alprazolam.  Patient states that after she talked with the nurse, she decided to stop the alprazolam altogether and has not taken any since she recently ran out of the 0.5 mg prescribed by her prior PCP.  She denies any suicidal thoughts or ideations.  She states that now that she has had improvement in chest pain, palpitations and abdominal discomfort that she is starting to feel better and less anxious.         Past Medical History:  Diagnosis Date   Anemia    Depression    Essential hypertension, benign      Fibroids    GERD (gastroesophageal reflux disease)    HH (hiatus hernia)    Hx of adenomatous colonic polyps    IBS (irritable bowel syndrome)    Osteopenia 10/2016   SUI (stress urinary incontinence, female) 09/29/2016    Past Surgical History:  Procedure Laterality Date   COLONOSCOPY  01/01/2005   Normal    POLYPECTOMY     UTERINE FIBROID EMBOLIZATION      Family History  Problem Relation Age of Onset   Hodgkin's lymphoma Mother    Alzheimer's disease Father    Colon cancer Maternal Grandfather    Stroke Paternal Grandfather    Breast cancer Sister    Breast cancer Maternal Aunt    Esophageal cancer Neg Hx    Stomach cancer Neg Hx    Rectal cancer Neg Hx     Social History   Tobacco Use   Smoking status: Never Smoker   Smokeless tobacco: Never Used  Substance Use Topics   Alcohol use: No    Alcohol/week: 0.0 standard drinks    Comment: occ   Drug use: No    Outpatient Medications Prior to Visit  Medication Sig Dispense Refill   ALPRAZolam (XANAX) 0.25 MG tablet Take 1 tablet (0.25 mg total) by mouth 2 (two) times daily as needed for anxiety. 30 tablet 0   amLODipine (  NORVASC) 5 MG tablet Take 1 tablet (5 mg total) by mouth daily. To lower blood pressure 30 tablet 3   BIOTIN PO Take 1 tablet by mouth daily.      esomeprazole (NEXIUM) 40 MG capsule Take 1 capsule (40 mg total) by mouth daily. 30 capsule 3   lisinopril (ZESTRIL) 20 MG tablet Take 1 tablet (20 mg total) by mouth daily. 90 tablet 3   No facility-administered medications prior to visit.     No Known Allergies  ROS Review of Systems  Constitutional: Positive for fatigue (improved). Negative for chills and fever.  HENT: Positive for congestion and postnasal drip. Negative for sore throat and trouble swallowing.   Eyes: Negative for photophobia and visual disturbance.  Respiratory: Negative for cough and shortness of breath.   Cardiovascular: Positive for chest pain  (infrequent) and palpitations (infrequent).  Gastrointestinal: Positive for abdominal pain (improved) and constipation (improved).  Endocrine: Negative for cold intolerance, heat intolerance, polydipsia, polyphagia and polyuria.  Genitourinary: Negative for dysuria and frequency.  Musculoskeletal: Negative for arthralgias, back pain and gait problem.  Neurological: Negative for dizziness and headaches.  Hematological: Negative for adenopathy. Does not bruise/bleed easily.  Psychiatric/Behavioral: Positive for sleep disturbance. Negative for self-injury and suicidal ideas. The patient is nervous/anxious (stable).       Objective:    Physical Exam  Constitutional: She appears well-developed and well-nourished.  WNWD female in NAD who appears slightly anxious  HENT:  Nose: Mucosal edema and rhinorrhea present. Right sinus exhibits no maxillary sinus tenderness and no frontal sinus tenderness. Left sinus exhibits no maxillary sinus tenderness and no frontal sinus tenderness.  Mouth/Throat: Posterior oropharyngeal edema and posterior oropharyngeal erythema present.  Cobblestoning or posterior pharynx  Neck: Normal range of motion. Neck supple. No JVD present. No thyromegaly present.  Cardiovascular: Normal rate and regular rhythm.  Pulmonary/Chest: Effort normal.  Abdominal: Soft. There is no abdominal tenderness. There is no rebound and no guarding.  Musculoskeletal: Normal range of motion.        General: No tenderness or edema.  Lymphadenopathy:    She has no cervical adenopathy.  Neurological: She is alert.  Skin: Skin is warm.  Psychiatric: Her behavior is normal. Thought content normal.  Nursing note and vitals reviewed.   BP 115/80 (BP Location: Left Arm, Patient Position: Sitting, Cuff Size: Large)    Pulse 87    Temp 98.4 F (36.9 C) (Oral)    Ht 5\' 7"  (1.702 m)    Wt 159 lb 3.2 oz (72.2 kg)    SpO2 100%    BMI 24.93 kg/m  Wt Readings from Last 3 Encounters:  10/12/18 159 lb  3.2 oz (72.2 kg)  10/03/18 162 lb 12.8 oz (73.8 kg)  09/22/18 166 lb 3.2 oz (75.4 kg)     Health Maintenance Due  Topic Date Due   TETANUS/TDAP  08/15/1983     Lab Results  Component Value Date   TSH 0.494 09/22/2018   Lab Results  Component Value Date   WBC 5.1 08/10/2018   HGB 13.1 08/10/2018   HCT 39.1 08/10/2018   MCV 89.1 08/10/2018   PLT 187 08/10/2018   Lab Results  Component Value Date   NA 141 09/22/2018   K 4.1 09/22/2018   CHLORIDE 106 04/30/2013   CO2 24 09/22/2018   GLUCOSE 96 09/22/2018   BUN 9 09/22/2018   CREATININE 0.92 09/22/2018   BILITOT 0.4 08/10/2018   ALKPHOS 62 08/10/2018   AST 24 08/10/2018  ALT 23 08/10/2018   PROT 7.5 08/10/2018   ALBUMIN 4.2 08/10/2018   CALCIUM 10.4 (H) 09/22/2018   ANIONGAP 13 08/10/2018   GFR 74.46 01/27/2018   Lab Results  Component Value Date   CHOL 181 05/02/2017   Lab Results  Component Value Date   HDL 64.70 05/02/2017   Lab Results  Component Value Date   LDLCALC 105 (H) 05/02/2017   Lab Results  Component Value Date   TRIG 56.0 05/02/2017   Lab Results  Component Value Date   CHOLHDL 3 05/02/2017   No results found for: HGBA1C    Assessment & Plan:  1. Essential hypertension Blood pressure is very well controlled on patient's current regimen of lisinopril and amlodipine.  Continue low-sodium diet and regular exercise.  It may be possible that the doses of each medication or 1 medication can be discontinued if blood pressure continues to remain low or if patient develops symptoms such as dizziness/lightheadedness.   2. Atypical chest pain Discussed GI and cardiology notes with the patient and she agrees to take pantoprazole 20 mg daily to reduce stomach acid.  She was previously on 40 mg but would like to try a lower dose.  She should avoid known trigger foods, avoid nonsteroidal anti-inflammatory medications and avoid late night eating within 2 hours of bedtime.  She will continue  follow-up with gastroenterology. - pantoprazole (PROTONIX) 20 MG tablet; Take 1 tablet (20 mg total) by mouth daily. To reduce stomach acid  Dispense: 30 tablet; Refill: 6  3. Palpitations She reports that palpitations have generally subsided.  Labs from her most recent visit were reviewed with the patient.  She did have a low normal TSH but normal T4 and I suggested that this be repeated in about 6 months.  BMP was normal  4. Gastroesophageal reflux disease, esophagitis presence not specified Patient had recent negative H. pylori antibody test.  Patient agrees to take pantoprazole 20 mg daily for acid reflux and she is aware that she should avoid known trigger foods, avoid nonsteroidal anti-inflammatories and avoid late night eating.  She should continue follow-up with GI - pantoprazole (PROTONIX) 20 MG tablet; Take 1 tablet (20 mg total) by mouth daily. To reduce stomach acid  Dispense: 30 tablet; Refill: 6  5. Allergic rhinitis, unspecified seasonality, unspecified trigger Patient with posterior pharynx erythema/edema and cobblestoning and reports nasal congestion as well as some difficulty with sleep.  Prescription provided for Xyzal which should help not only with postnasal drainage but may also cover also presently that is that should help with sleep.  Patient may take half tablet if 1 whole tablet causes increased sedation or dry mouth.  If she does not feel that the Xyzal is helpful, she may try over-the-counter loratadine, Allegra or Zyrtec - levocetirizine (XYZAL) 5 MG tablet; Take 1 tablet (5 mg total) by mouth every evening. For nasal congestion  Dispense: 30 tablet; Refill: 11  An After Visit Summary was printed and given to the patient.   Follow-up: Return in about 8 weeks (around 12/07/2018) for atypical chest pain.   Antony Blackbird, MD

## 2018-10-19 ENCOUNTER — Telehealth: Payer: Self-pay

## 2018-10-19 ENCOUNTER — Ambulatory Visit (INDEPENDENT_AMBULATORY_CARE_PROVIDER_SITE_OTHER): Payer: BC Managed Care – PPO | Admitting: Obstetrics

## 2018-10-19 ENCOUNTER — Encounter: Payer: Self-pay | Admitting: Obstetrics

## 2018-10-19 ENCOUNTER — Other Ambulatory Visit: Payer: Self-pay

## 2018-10-19 VITALS — BP 107/73 | HR 75 | Ht 67.5 in | Wt 160.8 lb

## 2018-10-19 DIAGNOSIS — Z01419 Encounter for gynecological examination (general) (routine) without abnormal findings: Secondary | ICD-10-CM

## 2018-10-19 DIAGNOSIS — N951 Menopausal and female climacteric states: Secondary | ICD-10-CM

## 2018-10-19 DIAGNOSIS — Z1151 Encounter for screening for human papillomavirus (HPV): Secondary | ICD-10-CM | POA: Diagnosis not present

## 2018-10-19 DIAGNOSIS — D251 Intramural leiomyoma of uterus: Secondary | ICD-10-CM

## 2018-10-19 DIAGNOSIS — N898 Other specified noninflammatory disorders of vagina: Secondary | ICD-10-CM

## 2018-10-19 DIAGNOSIS — Z9889 Other specified postprocedural states: Secondary | ICD-10-CM

## 2018-10-19 DIAGNOSIS — Z124 Encounter for screening for malignant neoplasm of cervix: Secondary | ICD-10-CM

## 2018-10-19 NOTE — Progress Notes (Signed)
Subjective:        Erica [name] is a 53 y.o. female here for a routine exam.  Current complaints: None.    Personal health questionnaire:  Is patient [name], have a family history of breast and/or ovarian cancer: no Is there a family history of uterine cancer diagnosed at age < 41, gastrointestinal cancer, urinary tract cancer, family member who is a Field seismologist syndrome-associated carrier: no Is the patient overweight and hypertensive, family history of diabetes, personal history of gestational diabetes, preeclampsia or PCOS: no Is patient over 68, have PCOS,  family history of premature CHD under age 73, diabetes, smoke, have hypertension or peripheral artery disease:  yes At any time, has a partner hit, kicked or otherwise hurt or frightened you?: no Over the past 2 weeks, have you felt down, depressed or hopeless?: no Over the past 2 weeks, have you felt little interest or pleasure in doing things?:no   Gynecologic History No LMP recorded. Patient has had an ablation. Contraception: post menopausal status Last Pap: 09-01-2017. Results were: normal Last mammogram: 10-05-2018. Results were: normal  Obstetric History OB History  No obstetric history on file.    Past Medical History:  Diagnosis Date  . Anemia   . Depression   . Essential hypertension, benign   . Fibroids   . GERD (gastroesophageal reflux disease)   . HH (hiatus hernia)   . Hx of adenomatous colonic polyps   . IBS (irritable bowel syndrome)   . Osteopenia 10/2016  . SUI (stress urinary incontinence, female) 09/29/2016    Past Surgical History:  Procedure Laterality Date  . COLONOSCOPY  01/01/2005   Normal   . POLYPECTOMY    . UTERINE FIBROID EMBOLIZATION       Current Outpatient Medications:  .  amLODipine (NORVASC) 5 MG tablet, Take 1 tablet (5 mg total) by mouth daily. To lower blood pressure, Disp: 30 tablet, Rfl: 3 .  levocetirizine (XYZAL) 5 MG tablet, Take 1 tablet (5 mg total) by  mouth every evening. For nasal congestion, Disp: 30 tablet, Rfl: 11 .  lisinopril (ZESTRIL) 20 MG tablet, Take 1 tablet (20 mg total) by mouth daily., Disp: 90 tablet, Rfl: 3 .  omeprazole (PRILOSEC) 10 MG capsule, Take 10 mg by mouth daily., Disp: , Rfl:  .  ALPRAZolam (XANAX) 0.25 MG tablet, Take 1 tablet (0.25 mg total) by mouth 2 (two) times daily as needed for anxiety. (Patient not taking: Reported on 10/19/2018), Disp: 30 tablet, Rfl: 0 .  BIOTIN PO, Take 1 tablet by mouth daily. , Disp: , Rfl:  .  esomeprazole (NEXIUM) 40 MG capsule, Take 1 capsule (40 mg total) by mouth daily. (Patient not taking: Reported on 10/19/2018), Disp: 30 capsule, Rfl: 3 .  pantoprazole (PROTONIX) 20 MG tablet, Take 1 tablet (20 mg total) by mouth daily. To reduce stomach acid (Patient not taking: Reported on 10/19/2018), Disp: 30 tablet, Rfl: 6 No Known Allergies  Social History   Tobacco Use  . Smoking status: Never Smoker  . Smokeless tobacco: Never Used  Substance Use Topics  . Alcohol use: No    Alcohol/week: 0.0 standard drinks    Comment: occ    Family History  Problem Relation Age of Onset  . Hodgkin's lymphoma Mother   . Alzheimer's disease Father   . Colon cancer Maternal Grandfather   . Stroke Paternal Grandfather   . Breast cancer Sister   . Breast cancer Maternal Aunt   . Esophageal cancer Neg Hx   .  Stomach cancer Neg Hx   . Rectal cancer Neg Hx       Review of Systems  Constitutional: negative for fatigue and weight loss Respiratory: negative for cough and wheezing Cardiovascular: negative for chest pain, fatigue and palpitations Gastrointestinal: negative for abdominal pain and change in bowel habits Musculoskeletal:negative for myalgias Neurological: negative for gait problems and tremors Behavioral/Psych: negative for abusive relationship, depression Endocrine: negative for temperature intolerance    Genitourinary:negative for abnormal menstrual periods, genital lesions, hot  flashes, sexual problems and vaginal discharge Integument/breast: negative for breast lump, breast tenderness, nipple discharge and skin lesion(s)    Objective:       BP 107/73   Pulse 75   Ht 5' 7.5" (1.715 m)   Wt 160 lb 12.8 oz (72.9 kg)   BMI 24.81 kg/m  General:   alert  Skin:   no rash or abnormalities  Lungs:   clear to auscultation bilaterally  Heart:   regular rate and rhythm, S1, S2 normal, no murmur, click, rub or gallop  Breasts:   normal without suspicious masses, skin or nipple changes or axillary nodes  Abdomen:  normal findings: no organomegaly, soft, non-tender and no hernia  Pelvis:  External genitalia: normal general appearance Urinary system: urethral meatus normal and bladder without fullness, nontender Vaginal: normal without tenderness, induration or masses Cervix: normal appearance Adnexa: normal bimanual exam Uterus: anteverted and non-tender, normal size   Lab Review Urine pregnancy test Labs reviewed yes Radiologic studies reviewed yes  50% of 25 min visit spent on counseling and coordination of care.   Assessment:     1. Encounter for routine gynecological examination with Papanicolaou smear of cervix Rx: - Cytology - PAP( [facility]) - Cervicovaginal ancillary only( [facility])  2. Menopausal symptoms - hot flashes  3. Fibroids, intramural - clinically stable  4. Status post embolization of uterine artery    Plan:    Education reviewed: calcium supplements, depression evaluation, low fat, low cholesterol diet, self breast exams and weight bearing exercise.   Follow up in 1 year  No orders of the defined types were placed in this encounter.  No orders of the defined types were placed in this encounter.   Shelly Bombard MD 10-19-2018

## 2018-10-19 NOTE — Telephone Encounter (Signed)
Additional call from pt stating that she called with incorrect Rx and that if provider knows of any creams that work for lighting the skin dark spots to send that in.   Please advise.

## 2018-10-19 NOTE — Progress Notes (Signed)
Pt is in the office for annual, last pap 09-01-17, hx of ablation. Pt had pelvic u/s on 10-04-18 and would like to discuss results. Last mammogram 10-05-18 GAD-7= 0

## 2018-10-19 NOTE — Telephone Encounter (Signed)
TC from pt regarding Rx discussed in today's office visit pt states she was told to call with name of Rx Sluticasone Propionate Cream   Please advise.

## 2018-10-20 LAB — CERVICOVAGINAL ANCILLARY ONLY
Bacterial vaginitis: NEGATIVE
Candida vaginitis: NEGATIVE

## 2018-10-23 DIAGNOSIS — K219 Gastro-esophageal reflux disease without esophagitis: Secondary | ICD-10-CM | POA: Diagnosis not present

## 2018-10-23 DIAGNOSIS — D1803 Hemangioma of intra-abdominal structures: Secondary | ICD-10-CM | POA: Diagnosis not present

## 2018-10-23 DIAGNOSIS — K5900 Constipation, unspecified: Secondary | ICD-10-CM | POA: Diagnosis not present

## 2018-10-23 DIAGNOSIS — F331 Major depressive disorder, recurrent, moderate: Secondary | ICD-10-CM | POA: Diagnosis not present

## 2018-10-23 DIAGNOSIS — D509 Iron deficiency anemia, unspecified: Secondary | ICD-10-CM | POA: Diagnosis not present

## 2018-10-23 LAB — CYTOLOGY - PAP
Diagnosis: NEGATIVE
HPV: NOT DETECTED

## 2018-10-25 ENCOUNTER — Other Ambulatory Visit: Payer: Self-pay | Admitting: Obstetrics

## 2018-10-25 DIAGNOSIS — L814 Other melanin hyperpigmentation: Secondary | ICD-10-CM

## 2018-10-25 MED ORDER — HYDROQUINONE 2 % EX CREA: TOPICAL_CREAM | Freq: Two times a day (BID) | CUTANEOUS | 1 refills | Status: DC

## 2018-10-26 ENCOUNTER — Other Ambulatory Visit: Payer: Self-pay | Admitting: Family Medicine

## 2018-11-05 ENCOUNTER — Other Ambulatory Visit: Payer: Self-pay | Admitting: Family Medicine

## 2018-11-07 ENCOUNTER — Other Ambulatory Visit: Payer: Self-pay | Admitting: Gastroenterology

## 2018-11-07 DIAGNOSIS — F331 Major depressive disorder, recurrent, moderate: Secondary | ICD-10-CM | POA: Diagnosis not present

## 2018-11-07 DIAGNOSIS — D1803 Hemangioma of intra-abdominal structures: Secondary | ICD-10-CM

## 2018-11-14 ENCOUNTER — Other Ambulatory Visit: Payer: Self-pay | Admitting: Gastroenterology

## 2018-11-14 ENCOUNTER — Other Ambulatory Visit (HOSPITAL_COMMUNITY): Payer: Self-pay | Admitting: Gastroenterology

## 2018-11-14 DIAGNOSIS — D1803 Hemangioma of intra-abdominal structures: Secondary | ICD-10-CM

## 2018-11-20 ENCOUNTER — Other Ambulatory Visit: Payer: Self-pay

## 2018-11-20 ENCOUNTER — Ambulatory Visit (HOSPITAL_COMMUNITY)
Admission: RE | Admit: 2018-11-20 | Discharge: 2018-11-20 | Disposition: A | Discharge status: Home / Self Care | Payer: BC Managed Care – PPO | Source: Ambulatory Visit | Attending: Gastroenterology | Admitting: Gastroenterology

## 2018-11-20 DIAGNOSIS — D1803 Hemangioma of intra-abdominal structures: Secondary | ICD-10-CM | POA: Diagnosis not present

## 2018-11-20 MED ORDER — GADOBUTROL 1 MMOL/ML IV SOLN
8.0000 mL | Freq: Once | INTRAVENOUS | Status: AC | PRN
  Administered 2018-11-20: 8 mL via INTRAVENOUS

## 2018-11-21 DIAGNOSIS — F331 Major depressive disorder, recurrent, moderate: Secondary | ICD-10-CM | POA: Diagnosis not present

## 2018-11-23 ENCOUNTER — Telehealth: Payer: Self-pay

## 2018-11-23 NOTE — Telephone Encounter (Signed)
Pt would like to discuss results of MRI. Discussed with Dr. Jodi Mourning. He will contact patient to discuss.

## 2018-12-04 ENCOUNTER — Other Ambulatory Visit: Payer: BC Managed Care – PPO

## 2018-12-05 DIAGNOSIS — F331 Major depressive disorder, recurrent, moderate: Secondary | ICD-10-CM | POA: Diagnosis not present

## 2018-12-07 ENCOUNTER — Encounter: Payer: Self-pay | Admitting: Family Medicine

## 2018-12-07 ENCOUNTER — Other Ambulatory Visit: Payer: Self-pay

## 2018-12-07 ENCOUNTER — Ambulatory Visit: Payer: BC Managed Care – PPO | Attending: Family Medicine | Admitting: Family Medicine

## 2018-12-07 VITALS — BP 117/79 | HR 69 | Temp 99.6°F | Resp 18 | Ht 67.0 in | Wt 154.0 lb

## 2018-12-07 DIAGNOSIS — R0789 Other chest pain: Secondary | ICD-10-CM | POA: Diagnosis not present

## 2018-12-07 DIAGNOSIS — I10 Essential (primary) hypertension: Secondary | ICD-10-CM | POA: Diagnosis not present

## 2018-12-07 DIAGNOSIS — R195 Other fecal abnormalities: Secondary | ICD-10-CM

## 2018-12-07 DIAGNOSIS — R197 Diarrhea, unspecified: Secondary | ICD-10-CM

## 2018-12-07 DIAGNOSIS — R194 Change in bowel habit: Secondary | ICD-10-CM | POA: Diagnosis not present

## 2018-12-07 DIAGNOSIS — K219 Gastro-esophageal reflux disease without esophagitis: Secondary | ICD-10-CM | POA: Diagnosis not present

## 2018-12-07 DIAGNOSIS — J309 Allergic rhinitis, unspecified: Secondary | ICD-10-CM

## 2018-12-07 MED ORDER — FAMOTIDINE 20 MG PO TABS: 20.0000 mg | ORAL_TABLET | Freq: Two times a day (BID) | ORAL | 11 refills | Status: DC

## 2018-12-07 NOTE — Progress Notes (Signed)
Established Patient Office Visit  Subjective:  Patient ID: Erica [name], female    DOB: 01/14/1966  Age: 53 y.o. MRN: [id number]  CC:  Chief Complaint  Patient presents with  . Follow-up    HPI Erica [name] presents in follow-up of chronic issues including GERD, HTN, atypical chest pain. Constipation and s/p new start of medication for allergic rhinitis and new complaint of occasional clay colored stools.  She reports that she is taking medication per GI to help with constipation.  She is not currently on medicine for acid reflux.  She has not had any recent issues with chest pain.  She states that overall she feels much better.  She also feels that she is sleeping better and having less congestion after starting medication for allergic rhinitis.  She continues to take her blood pressure medication and denies any headaches or dizziness related to her blood pressure.       She has had a repeat MRI in follow-up of her liver hemangioma.  She denies any right upper quadrant pain.  She has been told that she does not have any gallbladder abnormalities on imaging.  She has however noticed that she has had recent episodes of light-colored stools and occasional loose stools.  Since she was previously always constipated she finds it somewhat surprising that she now has the episodes of loose stools.  No current issues with nausea.  She does tend and noticed the clay colored stools occurring after eating fatty or greasy foods.  Past Medical History:  Diagnosis Date  . Anemia   . Depression   . Essential hypertension, benign   . Fibroids   . GERD (gastroesophageal reflux disease)   . HH (hiatus hernia)   . Hx of adenomatous colonic polyps   . IBS (irritable bowel syndrome)   . Osteopenia 10/2016  . SUI (stress urinary incontinence, female) 09/29/2016    Past Surgical History:  Procedure Laterality Date  . COLONOSCOPY  01/01/2005   Normal   . POLYPECTOMY    . UTERINE FIBROID  EMBOLIZATION      Family History  Problem Relation Age of Onset  . Hodgkin's lymphoma Mother   . Alzheimer's disease Father   . Colon cancer Maternal Grandfather   . Stroke Paternal Grandfather   . Breast cancer Sister   . Breast cancer Maternal Aunt   . Esophageal cancer Neg Hx   . Stomach cancer Neg Hx   . Rectal cancer Neg Hx     Social History   Socioeconomic History  . Marital status: Single    Spouse name: Not on file  . Number of children: 0  . Years of education: Not on file  . Highest education level: Not on file  Occupational History  . Occupation: MI service specialist    Employer: [employer]  . Financial resource strain: Not on file  . Food insecurity    Worry: Not on file    Inability: Not on file  . Transportation needs    Medical: Not on file    Non-medical: Not on file  Tobacco Use  . Smoking status: Never Smoker  . Smokeless tobacco: Never Used  Substance and Sexual Activity  . Alcohol use: No    Alcohol/week: 0.0 standard drinks    Comment: occ  . Drug use: No  . Sexual activity: Not Currently    Birth control/protection: Abstinence  Lifestyle  . Physical activity    Days per week: Not  on file    Minutes per session: Not on file  . Stress: Not on file  Relationships  . Social Herbalist on phone: Not on file    Gets together: Not on file    Attends religious service: Not on file    Active member of club or organization: Not on file    Attends meetings of clubs or organizations: Not on file    Relationship status: Not on file  . Intimate partner violence    Fear of current or ex partner: Not on file    Emotionally abused: Not on file    Physically abused: Not on file    Forced sexual activity: Not on file  Other Topics Concern  . Not on file  Social History Narrative  . Not on file    Outpatient Medications Prior to Visit  Medication Sig Dispense Refill  . amLODipine (NORVASC) 5 MG tablet Take 1  tablet (5 mg total) by mouth daily. To lower blood pressure 30 tablet 3  . cetirizine (ZYRTEC) 5 MG tablet Take 5 mg by mouth daily.    Marland Kitchen linaCLOtide (LINZESS PO) Take by mouth.    Marland Kitchen lisinopril (ZESTRIL) 20 MG tablet Take 1 tablet (20 mg total) by mouth daily. 90 tablet 3  . ALPRAZolam (XANAX) 0.25 MG tablet Take 1 tablet (0.25 mg total) by mouth 2 (two) times daily as needed for anxiety. (Patient not taking: Reported on 10/19/2018) 30 tablet 0  . BIOTIN PO Take 1 tablet by mouth daily.     Marland Kitchen esomeprazole (NEXIUM) 40 MG capsule Take 1 capsule (40 mg total) by mouth daily. (Patient not taking: Reported on 10/19/2018) 30 capsule 3  . hydroquinone 2 % cream Apply topically 2 (two) times daily. (Patient not taking: Reported on 12/07/2018) 70 g 1  . levocetirizine (XYZAL) 5 MG tablet Take 1 tablet (5 mg total) by mouth every evening. For nasal congestion (Patient not taking: Reported on 12/07/2018) 30 tablet 11  . omeprazole (PRILOSEC) 10 MG capsule Take 10 mg by mouth daily.    . pantoprazole (PROTONIX) 20 MG tablet Take 1 tablet (20 mg total) by mouth daily. To reduce stomach acid (Patient not taking: Reported on 10/19/2018) 30 tablet 6   No facility-administered medications prior to visit.     No Known Allergies  ROS Review of Systems    Objective:    Physical Exam  Constitutional: She is oriented to person, place, and time. She appears well-developed and well-nourished.  WNWD female in NAD sitting on exam table wearing a face mask (due to [employer] policy)  Neck: Normal range of motion. Neck supple. No JVD present. No thyromegaly present.  Cardiovascular: Normal rate and regular rhythm.  Pulmonary/Chest: Effort normal and breath sounds normal.  Abdominal: Soft. There is no abdominal tenderness. There is no rebound and no guarding.  No reproducible right upper quadrant pain on exam  Musculoskeletal:        General: No tenderness or edema.     Comments: No CVA tenderness  Lymphadenopathy:    She  has no cervical adenopathy.  Neurological: She is alert and oriented to person, place, and time.  Skin: Skin is warm and dry.  Psychiatric: She has a normal mood and affect. Her behavior is normal.  Nursing note and vitals reviewed.   BP 117/79 (BP Location: Right Arm, Patient Position: Sitting, Cuff Size: Normal)   Pulse 69   Temp 99.6 F (37.6 C) (Oral)   Resp  18   Ht 5\' 7"  (1.702 m)   Wt 154 lb (69.9 kg)   LMP  (LMP Unknown)   SpO2 100%   BMI 24.12 kg/m  Wt Readings from Last 3 Encounters:  12/07/18 154 lb (69.9 kg)  10/19/18 160 lb 12.8 oz (72.9 kg)  10/12/18 159 lb 3.2 oz (72.2 kg)     Health Maintenance Due  Topic Date Due  . TETANUS/TDAP  08/15/1983    Lab Results  Component Value Date   TSH 0.494 09/22/2018   Lab Results  Component Value Date   WBC 5.1 08/10/2018   HGB 13.1 08/10/2018   HCT 39.1 08/10/2018   MCV 89.1 08/10/2018   PLT 187 08/10/2018   Lab Results  Component Value Date   NA 141 09/22/2018   K 4.1 09/22/2018   CHLORIDE 106 04/30/2013   CO2 24 09/22/2018   GLUCOSE 96 09/22/2018   BUN 9 09/22/2018   CREATININE 0.92 09/22/2018   BILITOT 0.4 08/10/2018   ALKPHOS 62 08/10/2018   AST 24 08/10/2018   ALT 23 08/10/2018   PROT 7.5 08/10/2018   ALBUMIN 4.2 08/10/2018   CALCIUM 10.4 (H) 09/22/2018   ANIONGAP 13 08/10/2018   GFR 74.46 01/27/2018   Lab Results  Component Value Date   CHOL 181 05/02/2017   Lab Results  Component Value Date   HDL 64.70 05/02/2017   Lab Results  Component Value Date   LDLCALC 105 (H) 05/02/2017   Lab Results  Component Value Date   TRIG 56.0 05/02/2017   Lab Results  Component Value Date   CHOLHDL 3 05/02/2017   No results found for: HGBA1C    Assessment & Plan:  1. Essential hypertension Blood pressure is very well controlled on her current amlodipine 5 mg and lisinopril 20 mg with today's blood pressure being 117/79.  Patient is aware of the goal blood pressure of 130/80 or less.  Continued  low-sodium/DASH diet  2. Atypical chest pain Patient has had improvement in some days has had no issue with atypical chest pain.  Patient was started on medication per GI to help with constipation, Linzess and she reports improvement in symptoms.  She was on both omeprazole and Protonix but did not like the way these medications made her feel and therefore she is not currently taking reflux medication at this time.  She was also seen by cardiology in follow-up of her atypical chest pain which was also thought to be possibly GI in nature and noncardiac.  3. Gastroesophageal reflux disease, esophagitis presence not specified Patient is not currently taking omeprazole or pantoprazole which were prescribed by GI.  Patient has had EGD in May of this year which was normal.  As patient does have some mild reflux type symptoms, she agrees to take Pepcid 20 mg twice daily to reduce stomach acid. - famotidine (PEPCID) 20 MG tablet; Take 1 tablet (20 mg total) by mouth 2 (two) times daily. To reduce stomach acid  Dispense: 60 tablet; Refill: 11  4. Change in bowel habits; 5.  Clay colored stools Patient reports that she is now having stools that are loose and some diarrhea as well as clay colored stools.  I discussed with the patient that this may be related to gallbladder dysfunction.  She did not have any evidence of gallstones or thickening of the gallbladder on her recent imaging done by GI in follow-up of her hepatic hemangioma.  Discussed scheduling patient for HIDA scan to see if the gallbladder  is functioning properly.  6. Allergic rhinitis, unspecified seasonality, unspecified trigger Patient reports improvement in congestion and increased ease of breathing after starting the use of Xyzal therefore patient will continue this medication for treatment of allergic rhinitis.  An After Visit Summary was printed and given to the patient.  Follow-up: Return in about 2 months (around 02/06/2019) for chronic  issues in 2-3 months; 3-4 weeks with Lurena Joiner for influenza/Tdap.   Antony Blackbird, MD

## 2018-12-07 NOTE — Patient Instructions (Signed)
Biliary Dyskinesia  Biliary dyskinesia is a condition in which the gallbladder or bile ducts cannot release or move bile normally. Bile is a fluid that is made in the liver. It helps the body to digest food. Bile flows to the gallbladder to be stored. When bile is needed for digestion, it leaves the gallbladder and flows through the bile ducts into the digestive tract. Biliary dyskinesia causes bile to build up, and that can cause abdomen (abdominal)pain. This condition may also be called:  Acalculous cholecystopathy.  Functional gallbladder disorder.  Sphincter of Oddi dysfunction. All these names describe gallbladder disorders that are not caused by gallbladder stones. Sphincter of Oddi dysfunction happens in the area where the duct for digestive juices from the pancreas (pancreatic duct) joins the bile duct before emptying into the digestive tract. If the opening into the digestive tract is too small, bile and pancreatic juices may back up and cause abdominal pain. What are the causes? The cause of biliary dyskinesia is poor function of the gallbladder. The exact reason this happens is often unknown. One reason may be changes in the gallbladder that are caused by obesity. What increases the risk? You are more likely to develop this condition if your mother or father had it, or if you are:  Overweight.  Female.  57-68 years old. What are the signs or symptoms? The main symptom of this condition is pain in the upper right side of the abdomen. Typically, the pain:  Starts about 30 minutes after a meal, especially a meal that is spicy or greasy.  Lasts for 30 minutes or longer.  Builds up gradually until it is a steady pain that is severe enough to interrupt daily activities. Other symptoms may include:  Nausea.  Vomiting.  Cramping.  Bloating.  Heartburn.  Diarrhea. How is this diagnosed? This condition may be diagnosed based on your symptoms, your medical history, and a  physical exam. You may have tests to rule out other conditions and to confirm the diagnosis. These may include:  Blood tests.  Ultrasound tests of the gallbladder.  HIDA scan. This is an X-ray test that can show if your gallbladder empties less than a normal amount of bile (gallbladder ejection fraction).  MRI or CT scan of the abdomen.  ERCP (endoscopic retrograde cholangiopancreatogram). During this procedure, a thin tube with a camera on the end is inserted into the throat and down into areas that need examination, such as the pancreas, bile ducts, liver, and gallbladder. Dye is injected into your blood, then X-rays are done. The dye helps your health care provider to see the areas that need examination. ERCP may be done to help diagnose sphincter of Oddi dysfunction. How is this treated? Treatment depends on the cause. The first step of treatment is usually to make lifestyle changes. Your health care provider may recommend:  Taking over-the-counter or prescription pain medicine.  Resting.  Losing weight.  Avoiding foods that are spicy or greasy. In some cases, the condition gets better with lifestyle changes only. However, in many cases, surgery to remove the gallbladder (cholecystectomy) is necessary. Follow these instructions at home:  Take over-the-counter and prescription medicines only as told by your health care provider.  Do not drive or use heavy machinery while taking prescription pain medicine.  If you are taking prescription pain medicine, take actions to prevent or treat constipation. Your health care provider may recommend that you: ? Drink enough fluid to keep your urine pale yellow. ? Eat foods that  are high in fiber, such as fresh fruits and vegetables, whole grains, and beans. ? Limit foods that are high in fat and processed sugars, such as fried or sweet foods. ? Take an over-the-counter or prescription medicine for constipation.  Rest and return to your normal  activities as told by your health care provider. Ask your health care provider what activities are safe for you.  Follow instructions from your health care provider about eating or drinking restrictions. You may need to limit fatty, greasy, and spicy foods if they cause symptoms.  Limit alcohol intake to no more than 1 drink a day for nonpregnant women and 2 drinks a day for men. One drink equals 12 oz of beer, 5 oz of wine, or 1 oz of hard liquor. Alcohol can irritate your stomach and your liver.  Do not use any products that contain nicotine or tobacco, such as cigarettes and e-cigarettes. If you need help quitting, ask your health care provider. Smoking can damage your digestive system.  Keep all follow-up visits as told by your health care provider. This is important. Contact a health care provider if:  Your abdominal pain comes back.  You have any of the following: ? Nausea. ? Vomiting. ? Diarrhea. ? Cramping. ? Bloating. ? Diarrhea. Summary  Biliary dyskinesia is a condition in which your gallbladder or bile ducts cannot release or move bile normally.  The main symptom of this condition is pain in the upper right side of the abdomen. The pain typically starts about 30 minutes after a meal, especially a meal that is spicy or greasy.  Treatment may include lifestyle changes, such as working to lose weight and avoiding certain foods. In many cases, surgery to remove the gallbladder (cholecystectomy) is necessary. This information is not intended to replace advice given to you by your health care provider. Make sure you discuss any questions you have with your health care provider. Document Released: 12/30/2016 Document Revised: 03/11/2017 Document Reviewed: 12/30/2016 Elsevier Patient Education  2020 [employer].

## 2018-12-13 ENCOUNTER — Other Ambulatory Visit: Payer: Self-pay | Admitting: Family Medicine

## 2018-12-13 DIAGNOSIS — I10 Essential (primary) hypertension: Secondary | ICD-10-CM

## 2018-12-19 DIAGNOSIS — F331 Major depressive disorder, recurrent, moderate: Secondary | ICD-10-CM | POA: Diagnosis not present

## 2018-12-21 ENCOUNTER — Other Ambulatory Visit: Payer: Self-pay | Admitting: Gastroenterology

## 2018-12-21 DIAGNOSIS — R1011 Right upper quadrant pain: Secondary | ICD-10-CM

## 2018-12-21 DIAGNOSIS — K5909 Other constipation: Secondary | ICD-10-CM

## 2018-12-22 ENCOUNTER — Telehealth: Payer: Self-pay | Admitting: Family Medicine

## 2018-12-22 NOTE — Telephone Encounter (Signed)
New Message  Pt states she is calling to see if she still needs to have the test for her Gallbladder and also if Dr. Chapman Fitch found out anything on the spot on her pelvic. Pt states it is ok to leave a detailed message.Please f/u

## 2018-12-24 NOTE — Telephone Encounter (Signed)
Please contact patient and let her know that an order has been placed for her to have a HIDA scan in follow-up of her clay colored stools and loose stools.  The HIDA scan checks to see how well the gallbladder is functioning.

## 2018-12-25 ENCOUNTER — Telehealth: Payer: Self-pay

## 2018-12-25 NOTE — Telephone Encounter (Signed)
Can you ask if she is referring to something she discussed with her OB/GYN, Dr. Jodi Mourning? Please see if you can obtain further information as to her question or see if this is something that she needs to discuss with her GYN

## 2018-12-25 NOTE — Telephone Encounter (Signed)
Pt is is requesting some more information regarding spot on her pelvic. Please f/u

## 2018-12-26 ENCOUNTER — Ambulatory Visit: Payer: BC Managed Care – PPO

## 2018-12-26 DIAGNOSIS — F331 Major depressive disorder, recurrent, moderate: Secondary | ICD-10-CM | POA: Diagnosis not present

## 2018-12-26 NOTE — Telephone Encounter (Signed)
Will forward to nurse 

## 2018-12-26 NOTE — Telephone Encounter (Signed)
Informed patient with information and she stated they already called her and the appt was made.

## 2018-12-26 NOTE — Telephone Encounter (Signed)
Spoke with patient to get more details per previous message in chart. Per pt she is that she was having UTI and called her OBGYN. Per pt after her conversation with the OBGYN that determined she was not having UTI, patient stated she told her OBGYN that it may be related to the issue the's having with her stomach. Per pt she do not know anything about spot on her pelvic. Staff informed patient that message will be disregarded then if she is not having the previous issue from previous note. Pt agreed and stated she is not having that issue.

## 2018-12-29 ENCOUNTER — Other Ambulatory Visit: Payer: Self-pay | Admitting: Family Medicine

## 2018-12-29 DIAGNOSIS — R1013 Epigastric pain: Secondary | ICD-10-CM

## 2019-01-01 ENCOUNTER — Encounter (HOSPITAL_COMMUNITY): Payer: BC Managed Care – PPO

## 2019-01-04 ENCOUNTER — Other Ambulatory Visit: Payer: Self-pay

## 2019-01-04 ENCOUNTER — Encounter (HOSPITAL_COMMUNITY)
Admission: RE | Admit: 2019-01-04 | Discharge: 2019-01-04 | Disposition: A | Discharge status: Home / Self Care | Payer: BC Managed Care – PPO | Source: Ambulatory Visit | Attending: Gastroenterology | Admitting: Gastroenterology

## 2019-01-04 DIAGNOSIS — R1011 Right upper quadrant pain: Secondary | ICD-10-CM | POA: Insufficient documentation

## 2019-01-04 DIAGNOSIS — F331 Major depressive disorder, recurrent, moderate: Secondary | ICD-10-CM | POA: Diagnosis not present

## 2019-01-04 DIAGNOSIS — K5909 Other constipation: Secondary | ICD-10-CM | POA: Insufficient documentation

## 2019-01-04 DIAGNOSIS — R112 Nausea with vomiting, unspecified: Secondary | ICD-10-CM | POA: Diagnosis not present

## 2019-01-04 MED ORDER — TECHNETIUM TC 99M MEBROFENIN IV KIT
5.1000 | PACK | Freq: Once | INTRAVENOUS | Status: AC | PRN
  Administered 2019-01-04: 5.1 via INTRAVENOUS

## 2019-01-10 ENCOUNTER — Other Ambulatory Visit: Payer: Self-pay

## 2019-01-10 ENCOUNTER — Ambulatory Visit (INDEPENDENT_AMBULATORY_CARE_PROVIDER_SITE_OTHER): Payer: BC Managed Care – PPO

## 2019-01-10 VITALS — BP 145/90 | HR 82 | Wt 157.8 lb

## 2019-01-10 DIAGNOSIS — R300 Dysuria: Secondary | ICD-10-CM | POA: Diagnosis not present

## 2019-01-10 LAB — POCT URINALYSIS DIPSTICK
Bilirubin, UA: NEGATIVE
Blood, UA: NEGATIVE
Glucose, UA: NEGATIVE
Nitrite, UA: NEGATIVE
Protein, UA: NEGATIVE
Spec Grav, UA: <=1.005 — AB (ref 1.010–1.025)
Urobilinogen, UA: 0.2 E.U./dL
pH, UA: 5 (ref 5.0–8.0)

## 2019-01-10 MED ORDER — NITROFURANTOIN MONOHYD MACRO 100 MG PO CAPS: 100.0000 mg | ORAL_CAPSULE | Freq: Two times a day (BID) | ORAL | 0 refills | Status: AC

## 2019-01-10 NOTE — Progress Notes (Signed)
Pt is here with c/o dysuria. Pt reports that the discomfort has been happening for about a month. Pt denies any blood. Advised pt that urine culture will be sent, but will go ahead and send treatment for UTI to pharmacy per protocol. Advised pt that we will let her know urine cx results once they are in, pt verbalizes understanding.

## 2019-01-12 LAB — URINE CULTURE

## 2019-01-15 DIAGNOSIS — F331 Major depressive disorder, recurrent, moderate: Secondary | ICD-10-CM | POA: Diagnosis not present

## 2019-01-16 ENCOUNTER — Encounter: Payer: Self-pay | Admitting: *Deleted

## 2019-01-23 DIAGNOSIS — F331 Major depressive disorder, recurrent, moderate: Secondary | ICD-10-CM | POA: Diagnosis not present

## 2019-01-30 DIAGNOSIS — F331 Major depressive disorder, recurrent, moderate: Secondary | ICD-10-CM | POA: Diagnosis not present

## 2019-02-01 ENCOUNTER — Other Ambulatory Visit: Payer: Self-pay

## 2019-02-01 ENCOUNTER — Ambulatory Visit: Payer: BC Managed Care – PPO | Admitting: Obstetrics

## 2019-02-01 ENCOUNTER — Encounter: Payer: Self-pay | Admitting: Obstetrics

## 2019-02-01 VITALS — BP 137/84 | HR 80 | Wt 156.0 lb

## 2019-02-01 DIAGNOSIS — J302 Other seasonal allergic rhinitis: Secondary | ICD-10-CM

## 2019-02-01 DIAGNOSIS — R3915 Urgency of urination: Secondary | ICD-10-CM

## 2019-02-01 LAB — POCT URINALYSIS DIPSTICK
Bilirubin, UA: NEGATIVE
Blood, UA: NEGATIVE
Glucose, UA: NEGATIVE
Ketones, UA: NEGATIVE
Leukocytes, UA: NEGATIVE
Nitrite, UA: NEGATIVE
Protein, UA: NEGATIVE
Spec Grav, UA: 1.01 (ref 1.010–1.025)
Urobilinogen, UA: 0.2 E.U./dL
pH, UA: 6 (ref 5.0–8.0)

## 2019-02-01 NOTE — Progress Notes (Signed)
Patient ID: Erica [name], female   DOB: 08-20-1965, 53 y.o.   MRN: [id number]  Chief Complaint  Patient presents with  . Urinary Tract Infection    urgency and some burning with urination    HPI Erica [name] is a 53 y.o. female.  Continues to have urinary urgency.  Urine culture is negative.  She was treated with Macrobid without relief.  Also states that her seasonal allergies are flaring up. HPI  Past Medical History:  Diagnosis Date  . Anemia   . Depression   . Essential hypertension, benign   . Fibroids   . GERD (gastroesophageal reflux disease)   . HH (hiatus hernia)   . Hx of adenomatous colonic polyps   . IBS (irritable bowel syndrome)   . Osteopenia 10/2016  . SUI (stress urinary incontinence, female) 09/29/2016    Past Surgical History:  Procedure Laterality Date  . COLONOSCOPY  01/01/2005   Normal   . POLYPECTOMY    . UTERINE FIBROID EMBOLIZATION      Family History  Problem Relation Age of Onset  . Hodgkin's lymphoma Mother   . Alzheimer's disease Father   . Colon cancer Maternal Grandfather   . Stroke Paternal Grandfather   . Breast cancer Sister   . Breast cancer Maternal Aunt   . Esophageal cancer Neg Hx   . Stomach cancer Neg Hx   . Rectal cancer Neg Hx     Social History Social History   Tobacco Use  . Smoking status: Never Smoker  . Smokeless tobacco: Never Used  Substance Use Topics  . Alcohol use: No    Alcohol/week: 0.0 standard drinks    Comment: occ  . Drug use: No    No Known Allergies  Current Outpatient Medications  Medication Sig Dispense Refill  . amLODipine (NORVASC) 5 MG tablet TAKE 1 TABLET (5 MG TOTAL) BY MOUTH DAILY. TO LOWER BLOOD PRESSURE 90 tablet 0  . linaCLOtide (LINZESS PO) Take by mouth.    Marland Kitchen lisinopril (ZESTRIL) 20 MG tablet Take 1 tablet (20 mg total) by mouth daily. 90 tablet 3  . ALPRAZolam (XANAX) 0.25 MG tablet Take 1 tablet (0.25 mg total) by mouth 2 (two) times daily as needed for anxiety.  (Patient not taking: Reported on 10/19/2018) 30 tablet 0  . BIOTIN PO Take 1 tablet by mouth daily.     . cetirizine (ZYRTEC) 5 MG tablet Take 5 mg by mouth daily.    Marland Kitchen esomeprazole (NEXIUM) 40 MG capsule TAKE 1 CAPSULE BY MOUTH EVERY DAY (Patient not taking: Reported on 01/10/2019) 90 capsule 1  . famotidine (PEPCID) 20 MG tablet Take 1 tablet (20 mg total) by mouth 2 (two) times daily. To reduce stomach acid (Patient not taking: Reported on 01/10/2019) 60 tablet 11  . hydroquinone 2 % cream Apply topically 2 (two) times daily. (Patient not taking: Reported on 12/07/2018) 70 g 1  . levocetirizine (XYZAL) 5 MG tablet Take 1 tablet (5 mg total) by mouth every evening. For nasal congestion (Patient not taking: Reported on 12/07/2018) 30 tablet 11  . omeprazole (PRILOSEC) 10 MG capsule Take 10 mg by mouth daily.    . pantoprazole (PROTONIX) 20 MG tablet Take 1 tablet (20 mg total) by mouth daily. To reduce stomach acid (Patient not taking: Reported on 10/19/2018) 30 tablet 6   No current facility-administered medications for this visit.     Review of Systems Review of Systems Constitutional: negative for fatigue and weight loss Respiratory: positive for  runny nose and congestion Cardiovascular: negative for chest pain, fatigue and palpitations Gastrointestinal: negative for abdominal pain and change in bowel habits Genitourinary: positive for urinary urgency Integument/breast: negative for nipple discharge Musculoskeletal:negative for myalgias Neurological: negative for gait problems and tremors Behavioral/Psych: negative for abusive relationship, depression Endocrine: negative for temperature intolerance      Blood pressure 137/84, pulse 80, weight 156 lb (70.8 kg).  Physical Exam Physical Exam General:   alert and no distress  Skin:   no rash or abnormalities  Lungs:   clear to auscultation bilaterally  Heart:   regular rate and rhythm, S1, S2 normal, no murmur, click, rub or gallop    50%  of 15 min visit spent on counseling and coordination of care.   Data Reviewed Labs Pelvic Ultrasound  Assessment     1. Urgency of urination Rx: - Ambulatory referral to Urology - POCT urinalysis dipstick:  Negative  2. Seasonal allergies Rx: - Ambulatory referral to Immunology    Plan    Follow up prn  Orders Placed This Encounter  Procedures  . Ambulatory referral to Urology    Referral Priority:   Routine    Referral Type:   Consultation    Referral Reason:   Specialty Services Required    Requested Specialty:   Urology    Number of Visits Requested:   1  . Ambulatory referral to Immunology    Referral Priority:   Routine    Referral Type:   Consultation    Referral Reason:   Specialty Services Required    Requested Specialty:   Immunology    Number of Visits Requested:   1  . POCT urinalysis dipstick     Shelly Bombard, MD 02/01/2019 4:34 PM

## 2019-02-06 DIAGNOSIS — F331 Major depressive disorder, recurrent, moderate: Secondary | ICD-10-CM | POA: Diagnosis not present

## 2019-02-13 DIAGNOSIS — F331 Major depressive disorder, recurrent, moderate: Secondary | ICD-10-CM | POA: Diagnosis not present

## 2019-02-22 ENCOUNTER — Encounter: Payer: Self-pay | Admitting: Family Medicine

## 2019-02-25 ENCOUNTER — Other Ambulatory Visit: Payer: Self-pay | Admitting: Family Medicine

## 2019-02-27 DIAGNOSIS — F331 Major depressive disorder, recurrent, moderate: Secondary | ICD-10-CM | POA: Diagnosis not present

## 2019-03-02 ENCOUNTER — Encounter: Payer: Self-pay | Admitting: Allergy

## 2019-03-02 ENCOUNTER — Other Ambulatory Visit: Payer: Self-pay

## 2019-03-02 ENCOUNTER — Ambulatory Visit (INDEPENDENT_AMBULATORY_CARE_PROVIDER_SITE_OTHER): Payer: BC Managed Care – PPO | Admitting: Allergy

## 2019-03-02 VITALS — BP 144/80 | HR 71 | Temp 97.9°F | Resp 16 | Ht 67.0 in | Wt 154.8 lb

## 2019-03-02 DIAGNOSIS — K9049 Malabsorption due to intolerance, not elsewhere classified: Secondary | ICD-10-CM

## 2019-03-02 DIAGNOSIS — J3089 Other allergic rhinitis: Secondary | ICD-10-CM | POA: Diagnosis not present

## 2019-03-02 MED ORDER — MONTELUKAST SODIUM 10 MG PO TABS: 10.0000 mg | ORAL_TABLET | Freq: Every day | ORAL | 5 refills | Status: DC

## 2019-03-02 NOTE — Patient Instructions (Addendum)
Allergies  - environmental allergy skin testing today is positive to weed pollens, mold, cat, dog and mixed feathers (ie. Down)   - allergen avoidance measures discussed/handouts provided  - continue Xyzal 5mg  daily  - resume use of Flonase 2 sprays each nostril daily for nasal congestion.  Use for 1-2 weeks before stopping once symptoms improve.    - begin Singulair 10mg  daily at bedtime.  This medication works alongside antihistamines (Xyzal) to help block leukotrienes which can also cause similar symptoms like histamine.  If you notice any change in mood/behavior/sleep after starting Singulair then stop this medication and let us know.  Symptoms resolve after stopping the medication.    - allergen immunotherapy discussed today including protocol, benefits and risk.  Informational handout provided.  If interested in this therapuetic option you can check with your insurance carrier for coverage.  Let us know if you would like to proceed with this option.    Food intolerance  - skin testing for common food allergens (dairy, wheat, soy, sesame, egg, nuts, fish/shellfish) is    Follow-up 4 months or sooner if needed

## 2019-03-02 NOTE — Progress Notes (Signed)
New Patient Note  RE: Erica [name] MRN: [id number] DOB: 1966/04/07 Date of Office Visit: 03/02/2019  Referring provider: Antony Blackbird, MD Primary care provider: Antony Blackbird, MD  Chief Complaint: worsening allergies  History of present illness: Erica [name] is a 53 y.o. female presenting today for consultation for seasonal allergies.   She reports symptoms of post-nasal drip that she can feel, congestion, chest heaviness, occasional itchy/watery eyes, sore throat.  Symptoms are year round and has been dealing with these symptoms for many years.  She is taking Xyzal now since summer which she states has been helpful.  She has used flonase in past that also was helpful but is not taking it at this time.  Denies cough or wheeze or shortness of breath.   Denies history of asthma, eczema or food allergy.   However over the past several years she has noticed when she eats wheat she seems to get constipated.   She states she was having a lot of chest pain and stomach pain when she was taking biotin supplements that she was taking for hair and nails.  She stopped taking the biotin supplements.    She is currently taking pepcid for reflux control.     Review of systems: Review of Systems  Constitutional: Negative for chills, fever and malaise/fatigue.  HENT: Positive for congestion. Negative for ear discharge, ear pain, nosebleeds and sore throat.   Eyes: Negative for pain, discharge and redness.  Respiratory: Negative.   Cardiovascular: Negative.   Gastrointestinal: Negative.   Musculoskeletal: Negative.   Skin: Negative for itching and rash.  Neurological: Negative.     All other systems negative unless noted above in HPI  Past medical history: Past Medical History:  Diagnosis Date  . Anemia   . Depression   . Essential hypertension, benign   . Fibroids   . GERD (gastroesophageal reflux disease)   . HH (hiatus hernia)   . Hx of adenomatous colonic polyps    . IBS (irritable bowel syndrome)   . Osteopenia 10/2016  . SUI (stress urinary incontinence, female) 09/29/2016    Past surgical history: Past Surgical History:  Procedure Laterality Date  . COLONOSCOPY  01/01/2005   Normal   . POLYPECTOMY    . UTERINE FIBROID EMBOLIZATION      Family history:  Family History  Problem Relation Age of Onset  . Hodgkin's lymphoma Mother   . Alzheimer's disease Father   . Colon cancer Maternal Grandfather   . Stroke Paternal Grandfather   . Breast cancer Sister   . Breast cancer Maternal Aunt   . Esophageal cancer Neg Hx   . Stomach cancer Neg Hx   . Rectal cancer Neg Hx     Social history: Lives in a home without carpeting with electric heating and central cooling.  No pets in the home.  No water damage or mildew or roaches in the home.    Medication List: Current Outpatient Medications  Medication Sig Dispense Refill  . amLODipine (NORVASC) 5 MG tablet TAKE 1 TABLET (5 MG TOTAL) BY MOUTH DAILY. TO LOWER BLOOD PRESSURE 90 tablet 0  . famotidine (PEPCID) 20 MG tablet Take 1 tablet (20 mg total) by mouth 2 (two) times daily. To reduce stomach acid 60 tablet 11  . levocetirizine (XYZAL) 5 MG tablet Take 1 tablet (5 mg total) by mouth every evening. For nasal congestion 30 tablet 11  . linaCLOtide (LINZESS PO) Take by mouth.    Marland Kitchen lisinopril (ZESTRIL)  20 MG tablet Take 1 tablet (20 mg total) by mouth daily. 90 tablet 3  . Multiple Vitamins-Minerals (CENTRUM SILVER 50+WOMEN PO) Take by mouth.     No current facility-administered medications for this visit.     Known medication allergies: No Known Allergies   Physical examination: Blood pressure (!) 144/80, pulse 71, temperature 97.9 F (36.6 C), temperature source Temporal, resp. rate 16, height 5\' 7"  (1.702 m), weight 154 lb 12.8 oz (70.2 kg), SpO2 99 %.  General: Alert, interactive, in no acute distress. HEENT: PERRLA, TMs pearly gray, turbinates moderately edematous without discharge,  post-pharynx non erythematous. Neck: Supple without lymphadenopathy. Lungs: Clear to auscultation without wheezing, rhonchi or rales. {no increased work of breathing. CV: Normal S1, S2 without murmurs. Abdomen: Nondistended, nontender. Skin: Warm and dry, without lesions or rashes. Extremities:  No clubbing, cyanosis or edema. Neuro:   Grossly intact.  Diagnositics/Labs:  Allergy testing: environmental allergy skin prick testing is positive to weed pollens, cladosporium, cat, dog, mixed feathers.   10- common food allergens is negative.  Allergy testing results were read and interpreted by provider, documented by clinical staff.   Assessment and plan:   Allergic rhinitis  - environmental allergy skin testing today is positive to weed pollens, mold, cat, dog and mixed feathers (ie. Down)   - allergen avoidance measures discussed/handouts provided  - continue Xyzal 5mg  daily  - resume use of Flonase 2 sprays each nostril daily for nasal congestion.  Use for 1-2 weeks before stopping once symptoms improve.    - begin Singulair 10mg  daily at bedtime.  This medication works alongside antihistamines (Xyzal) to help block leukotrienes which can also cause similar symptoms like histamine.  If you notice any change in mood/behavior/sleep after starting Singulair then stop this medication and let us know.  Symptoms resolve after stopping the medication.    - allergen immunotherapy discussed today including protocol, benefits and risk.  Informational handout provided.  If interested in this therapuetic option you can check with your insurance carrier for coverage.  Let us know if you would like to proceed with this option.    Food intolerance  - skin testing for common food allergens (dairy, wheat, soy, sesame, egg, nuts, fish/shellfish) is    Follow-up 4 months or sooner if needed  I appreciate the opportunity to take part in Terryann's care. Please do not hesitate to contact me with questions.   Sincerely,   Prudy Feeler, MD Allergy/Immunology [facility]

## 2019-03-06 DIAGNOSIS — F331 Major depressive disorder, recurrent, moderate: Secondary | ICD-10-CM | POA: Diagnosis not present

## 2019-03-21 ENCOUNTER — Other Ambulatory Visit: Payer: Self-pay | Admitting: Family Medicine

## 2019-03-21 DIAGNOSIS — I10 Essential (primary) hypertension: Secondary | ICD-10-CM

## 2019-03-22 ENCOUNTER — Encounter: Payer: Self-pay | Admitting: Family Medicine

## 2019-03-22 NOTE — Telephone Encounter (Signed)
1) Medication(s) Requested (by name): amLODipine (NORVASC) 5 MG tablet    2) [facility]: [id number] - [place], [address] [place] [address]   3) Special Requests:   Approved medications will be sent to the pharmacy, we will reach out if there is an issue.  Requests made after 3pm may not be addressed until the following business day!  If a patient is unsure of the name of the medication(s) please note and ask patient to call back when they are able to provide all info, do not send to responsible party until all information is available!

## 2019-03-27 DIAGNOSIS — F331 Major depressive disorder, recurrent, moderate: Secondary | ICD-10-CM | POA: Diagnosis not present

## 2019-04-04 DIAGNOSIS — F331 Major depressive disorder, recurrent, moderate: Secondary | ICD-10-CM | POA: Diagnosis not present

## 2019-04-10 DIAGNOSIS — F331 Major depressive disorder, recurrent, moderate: Secondary | ICD-10-CM | POA: Diagnosis not present

## 2019-04-24 DIAGNOSIS — F331 Major depressive disorder, recurrent, moderate: Secondary | ICD-10-CM | POA: Diagnosis not present

## 2019-05-01 DIAGNOSIS — F331 Major depressive disorder, recurrent, moderate: Secondary | ICD-10-CM | POA: Diagnosis not present

## 2019-05-08 DIAGNOSIS — F331 Major depressive disorder, recurrent, moderate: Secondary | ICD-10-CM | POA: Diagnosis not present

## 2019-05-15 DIAGNOSIS — F331 Major depressive disorder, recurrent, moderate: Secondary | ICD-10-CM | POA: Diagnosis not present

## 2019-05-22 DIAGNOSIS — F331 Major depressive disorder, recurrent, moderate: Secondary | ICD-10-CM | POA: Diagnosis not present

## 2019-05-29 DIAGNOSIS — F331 Major depressive disorder, recurrent, moderate: Secondary | ICD-10-CM | POA: Diagnosis not present

## 2019-06-05 DIAGNOSIS — F331 Major depressive disorder, recurrent, moderate: Secondary | ICD-10-CM | POA: Diagnosis not present

## 2019-06-14 DIAGNOSIS — F331 Major depressive disorder, recurrent, moderate: Secondary | ICD-10-CM | POA: Diagnosis not present

## 2019-06-15 ENCOUNTER — Other Ambulatory Visit: Payer: Self-pay

## 2019-06-15 ENCOUNTER — Encounter: Payer: Self-pay | Admitting: Family Medicine

## 2019-06-15 ENCOUNTER — Other Ambulatory Visit: Payer: Self-pay | Admitting: Family Medicine

## 2019-06-15 ENCOUNTER — Ambulatory Visit: Payer: BC Managed Care – PPO | Attending: Family Medicine | Admitting: Family Medicine

## 2019-06-15 VITALS — BP 125/82 | HR 75 | Temp 98.2°F | Resp 18 | Ht 67.5 in | Wt 162.0 lb

## 2019-06-15 DIAGNOSIS — R739 Hyperglycemia, unspecified: Secondary | ICD-10-CM

## 2019-06-15 DIAGNOSIS — I10 Essential (primary) hypertension: Secondary | ICD-10-CM

## 2019-06-15 DIAGNOSIS — K5909 Other constipation: Secondary | ICD-10-CM

## 2019-06-15 DIAGNOSIS — D1803 Hemangioma of intra-abdominal structures: Secondary | ICD-10-CM

## 2019-06-15 DIAGNOSIS — R7989 Other specified abnormal findings of blood chemistry: Secondary | ICD-10-CM

## 2019-06-15 DIAGNOSIS — E876 Hypokalemia: Secondary | ICD-10-CM

## 2019-06-15 DIAGNOSIS — Z79899 Other long term (current) drug therapy: Secondary | ICD-10-CM

## 2019-06-15 NOTE — Progress Notes (Signed)
Subjective:  Patient ID: Erica [name], female    DOB: 03/10/1966  Age: 54 y.o. MRN: [id number]  CC: Medication Management   HPI Erica [name], 54 year old female, who presents secondary to complaint of recently having abnormal slightly painful sensation in her abdomen after eating cheese sticks and fried chicken wings.  She states that within an hour after completing the meal, she began to have a painful sensation in her abdomen and she has not had that type of food again and has had no further episodes of similar pain pain.  She believes that she did have worsening of her constipation shortly after having the cheese sticks and fried wings.  She continues to take Linzess as prescribed by gastroenterology to help with her chronic constipation.  She thinks that this medication has helped.  Upon questioning, her abdomen pain after eating, occurred in the left upper abdomen.  Though she is having more regular bowel movements, she still feels as if she has somewhat constipated.  She also wondered if her hemangioma of the liver could have contributed  to her episode of abdominal pain.  She denies any change in the color or shape of her bowel movements.  No blood in the stool and no black stools.  She continues to take Pepcid and has had no issues with acid reflux symptoms but wonders if it is okay for her to take the medication long-term.  She denies any sore throat or difficulty swallowing.  She has had no issues with chest pain or palpitations.  She continues to take lisinopril for treatment of hypertension and has had no associated cough with the use of this medication.  She denies any headaches or dizziness related to the blood pressure.  She also recalls that she was told in the past that she had an abnormality in her thyroid blood test and she would like to have recheck of her thyroid blood work at today's visit.  Past Medical History:  Diagnosis Date  . Anemia   . Depression   .  Essential hypertension, benign   . Fibroids   . GERD (gastroesophageal reflux disease)   . HH (hiatus hernia)   . Hx of adenomatous colonic polyps   . IBS (irritable bowel syndrome)   . Osteopenia 10/2016  . SUI (stress urinary incontinence, female) 09/29/2016    Past Surgical History:  Procedure Laterality Date  . COLONOSCOPY  01/01/2005   Normal   . POLYPECTOMY    . UTERINE FIBROID EMBOLIZATION      Family History  Problem Relation Age of Onset  . Hodgkin's lymphoma Mother   . Alzheimer's disease Father   . Colon cancer Maternal Grandfather   . Stroke Paternal Grandfather   . Breast cancer Sister   . Breast cancer Maternal Aunt   . Esophageal cancer Neg Hx   . Stomach cancer Neg Hx   . Rectal cancer Neg Hx     Social History   Tobacco Use  . Smoking status: Never Smoker  . Smokeless tobacco: Never Used  Substance Use Topics  . Alcohol use: No    Alcohol/week: 0.0 standard drinks    Comment: occ    ROS Review of Systems  Constitutional: Positive for fatigue (occasinoal, mild). Negative for appetite change, chills, fever and unexpected weight change.  HENT: Negative for sore throat and trouble swallowing.   Eyes: Negative for photophobia and visual disturbance.  Respiratory: Negative for cough and shortness of breath.   Cardiovascular: Negative for chest  pain and palpitations.  Gastrointestinal: Positive for constipation. Negative for abdominal pain, blood in stool, diarrhea and nausea.  Endocrine: Negative for cold intolerance, heat intolerance, polydipsia, polyphagia and polyuria.  Genitourinary: Negative for dysuria and frequency.  Musculoskeletal: Negative for arthralgias and back pain.  Neurological: Negative for dizziness and headaches.  Hematological: Negative for adenopathy. Does not bruise/bleed easily.    Objective:   Today's Vitals: BP 125/82 (BP Location: Left Arm, Patient Position: Sitting, Cuff Size: Normal)   Pulse 75   Temp 98.2 F (36.8 C)  (Oral)   Resp 18   Ht 5' 7.5" (1.715 m)   Wt 162 lb (73.5 kg)   LMP  (LMP Unknown)   SpO2 100%   BMI 25.00 kg/m   Physical Exam Vitals and nursing note reviewed.  Constitutional:      Appearance: Normal appearance.  Neck:     Vascular: No carotid bruit.  Cardiovascular:     Rate and Rhythm: Normal rate and regular rhythm.  Pulmonary:     Effort: Pulmonary effort is normal.     Breath sounds: Normal breath sounds.  Abdominal:     General: There is distension (slight abdominal fullness on exam).     Palpations: Abdomen is soft.     Tenderness: There is no abdominal tenderness. There is no right CVA tenderness, left CVA tenderness, guarding or rebound.  Musculoskeletal:        General: No tenderness.     Cervical back: Normal range of motion and neck supple. No tenderness.     Right lower leg: No edema.     Left lower leg: No edema.  Lymphadenopathy:     Cervical: No cervical adenopathy.  Skin:    General: Skin is warm and dry.  Neurological:     General: No focal deficit present.     Mental Status: She is alert and oriented to person, place, and time.  Psychiatric:        Mood and Affect: Mood normal.        Behavior: Behavior normal.     Comments: Slightly anxious     Assessment & Plan:  1. Chronic constipation; cavernous hemangioma of liver Discussed with patient that based on the location of the abdominal pain she experienced a few weeks ago that her symptoms are likely related to her constipation.  She still has her gallbladder and I discussed with the patient that this is located in the right upper abdomen.  She has had recent imaging that was also discussed with the patient that did not show any evidence of gallbladder disease or gallstones and she has had HIDA scan showing normal function of the gallbladder.  She has been followed by gastroenterology for her chronic constipation as well as for liver hemangioma and she does have upcoming imaging scheduled for future  follow-up with a hemangioma.  She is encouraged to continue increased water and fiber in the diet along with fresh fruits and vegetables to help with her chronic constipation and continue Linzess as per gastroenterology.  If she goes more than 2 days without a bowel movement, she was advised to take an over-the-counter laxative or drink apple/prune or pear juice to help with constipation.  Will check electrolytes to look for dehydration or low potassium in follow-up of use of Linzess as well as T4 and TSH to look for possible thyroid disorder which could cause worsening of her constipation. - Basic Metabolic Panel - T4 AND TSH  2. Elevated blood sugar level  She has had elevated blood sugar levels in the past and will have repeat basic metabolic panel and hemoglobin A1c at today's visit and follow-up.  She denies any current symptoms suggestive of diabetes such as increased thirst or urinary frequency. - Basic Metabolic Panel - Hemoglobin A1C  3. Hypokalemia She has previously had hypokalemia and she will have basic metabolic panel to check potassium level at today's visit. - Basic Metabolic Panel  4. Essential hypertension Blood pressure appears to be controlled on her current lisinopril 20 mg daily and she reports no issues with cough related to the medication use.  She is to continue her lisinopril.  5.  Abnormal thyroid blood work Discussed with patient that she did have low TSH on 09/22/2018 and this will be repeated at today's visit along with T4 to look for any continued abnormality in thyroid hormones which may require further evaluation. -TSH and T4  6. Encounter for long-term (current) use of medications Patient with long-term use of medications including Linzess for constipation and will recheck BMP to look for any signs of low potassium or dehydration.  She also takes lisinopril for hypertension which can cause issues with potassium. - Basic Metabolic Panel  Outpatient Encounter  Medications as of 06/15/2019  Medication Sig  . amLODipine (NORVASC) 5 MG tablet TAKE 1 TABLET (5 MG TOTAL) BY MOUTH DAILY. TO LOWER BLOOD PRESSURE  . famotidine (PEPCID) 20 MG tablet Take 1 tablet (20 mg total) by mouth 2 (two) times daily. To reduce stomach acid  . levocetirizine (XYZAL) 5 MG tablet Take 1 tablet (5 mg total) by mouth every evening. For nasal congestion  . linaCLOtide (LINZESS PO) Take by mouth.  Marland Kitchen lisinopril (ZESTRIL) 20 MG tablet Take 1 tablet (20 mg total) by mouth daily.  . montelukast (SINGULAIR) 10 MG tablet Take 1 tablet (10 mg total) by mouth at bedtime.  . Multiple Vitamins-Minerals (CENTRUM SILVER 50+WOMEN PO) Take by mouth.   No facility-administered encounter medications on file as of 06/15/2019.    An After Visit Summary was printed and given to the patient.   Follow-up: Return in about 4 months (around 10/15/2019) for chronic issues; sooner if needed.   Antony Blackbird MD

## 2019-06-16 LAB — T4 AND TSH
T4, Total: 7.5 ug/dL (ref 4.5–12.0)
TSH: 0.593 u[IU]/mL (ref 0.450–4.500)

## 2019-06-16 LAB — HEMOGLOBIN A1C
Est. average glucose Bld gHb Est-mCnc: 105 mg/dL
Hgb A1c MFr Bld: 5.3 % (ref 4.8–5.6)

## 2019-06-16 LAB — BASIC METABOLIC PANEL WITH GFR
BUN/Creatinine Ratio: 14 (ref 9–23)
BUN: 13 mg/dL (ref 6–24)
CO2: 25 mmol/L (ref 20–29)
Calcium: 10.2 mg/dL (ref 8.7–10.2)
Chloride: 104 mmol/L (ref 96–106)
Creatinine, Ser: 0.91 mg/dL (ref 0.57–1.00)
GFR calc Af Amer: 83 mL/min/1.73
GFR calc non Af Amer: 72 mL/min/1.73
Glucose: 76 mg/dL (ref 65–99)
Potassium: 4.5 mmol/L (ref 3.5–5.2)
Sodium: 141 mmol/L (ref 134–144)

## 2019-06-17 DIAGNOSIS — Z20828 Contact with and (suspected) exposure to other viral communicable diseases: Secondary | ICD-10-CM | POA: Diagnosis not present

## 2019-06-17 DIAGNOSIS — Z03818 Encounter for observation for suspected exposure to other biological agents ruled out: Secondary | ICD-10-CM | POA: Diagnosis not present

## 2019-06-21 DIAGNOSIS — F331 Major depressive disorder, recurrent, moderate: Secondary | ICD-10-CM | POA: Diagnosis not present

## 2019-06-26 DIAGNOSIS — F331 Major depressive disorder, recurrent, moderate: Secondary | ICD-10-CM | POA: Diagnosis not present

## 2019-06-28 ENCOUNTER — Ambulatory Visit: Payer: BC Managed Care – PPO | Attending: Internal Medicine

## 2019-06-28 DIAGNOSIS — Z23 Encounter for immunization: Secondary | ICD-10-CM

## 2019-06-28 NOTE — Progress Notes (Signed)
   Covid-19 Vaccination Clinic  Name:  Verena Ardis    MRN: [id number] DOB: October 04, 1965  06/28/2019  Ms. Swanigan was observed post Covid-19 immunization for 15 minutes without incident. She was provided with Vaccine Information Sheet and instruction to access the V-Safe system.   Ms. Martzall was instructed to call 911 with any severe reactions post vaccine: Marland Kitchen Difficulty breathing  . Swelling of face and throat  . A fast heartbeat  . A bad rash all over body  . Dizziness and weakness   Immunizations Administered    Name Date Dose VIS Date Route   Pfizer COVID-19 Vaccine 06/28/2019  1:36 PM 0.3 mL 03/23/2019 Intramuscular   Manufacturer: [place]   Lot: [id number]   [place]: [id number]

## 2019-07-23 ENCOUNTER — Ambulatory Visit: Payer: BC Managed Care – PPO | Attending: Internal Medicine

## 2019-07-23 DIAGNOSIS — Z23 Encounter for immunization: Secondary | ICD-10-CM

## 2019-07-23 NOTE — Progress Notes (Signed)
   Covid-19 Vaccination Clinic  Name:  Erica [name]    MRN: [id number] DOB: 19-Feb-1966  07/23/2019  [name] was observed post Covid-19 immunization for 15 minutes without incident. She was provided with Vaccine Information Sheet and instruction to access the V-Safe system.   [name] was instructed to call 911 with any severe reactions post vaccine: Marland Kitchen Difficulty breathing  . Swelling of face and throat  . A fast heartbeat  . A bad rash all over body  . Dizziness and weakness   Immunizations Administered    Name Date Dose VIS Date Route   Pfizer COVID-19 Vaccine 07/23/2019 10:46 AM 0.3 mL 03/23/2019 Intramuscular   Manufacturer: [place]   Lot: [id number]   [place]: [id number]

## 2019-07-31 DIAGNOSIS — F331 Major depressive disorder, recurrent, moderate: Secondary | ICD-10-CM | POA: Diagnosis not present

## 2019-08-21 DIAGNOSIS — F331 Major depressive disorder, recurrent, moderate: Secondary | ICD-10-CM | POA: Diagnosis not present

## 2019-08-22 ENCOUNTER — Other Ambulatory Visit: Payer: Self-pay

## 2019-08-22 DIAGNOSIS — N760 Acute vaginitis: Secondary | ICD-10-CM

## 2019-08-22 DIAGNOSIS — B9689 Other specified bacterial agents as the cause of diseases classified elsewhere: Secondary | ICD-10-CM

## 2019-08-22 MED ORDER — METRONIDAZOLE 0.75 % VA GEL: 1.0000 | Freq: Every day | VAGINAL | 1 refills | Status: DC

## 2019-08-22 NOTE — Progress Notes (Signed)
Pt called reporting she used a douche and is now experiencing vaginal burning and irritation. I advised pt I will send her Metrogel Rx to preferred pharmacy per protocol for BV. Advised pt to call back if symptoms worsen or do not improve. Pt voices understanding.

## 2019-08-29 ENCOUNTER — Other Ambulatory Visit: Payer: Self-pay | Admitting: Family Medicine

## 2019-08-29 ENCOUNTER — Telehealth: Payer: Self-pay

## 2019-08-29 ENCOUNTER — Other Ambulatory Visit: Payer: Self-pay | Admitting: Allergy

## 2019-08-29 MED ORDER — FLUCONAZOLE 150 MG PO TABS: 150.0000 mg | ORAL_TABLET | Freq: Once | ORAL | 0 refills | Status: AC

## 2019-08-29 NOTE — Telephone Encounter (Signed)
Last OV 03/02/2019.  Per chart note: Follow-up 4 months or sooner if needed.  Gave one courtesy refill on montelukast 10 mg.  Called patient to inform needs OV.  She will call office back to make OV.

## 2019-08-29 NOTE — Telephone Encounter (Signed)
Returned call and pt stated that after using metrogel she now has a yeast infection and is requesting rx, sent per protocol.

## 2019-08-30 DIAGNOSIS — F331 Major depressive disorder, recurrent, moderate: Secondary | ICD-10-CM | POA: Diagnosis not present

## 2019-09-01 ENCOUNTER — Other Ambulatory Visit: Payer: Self-pay | Admitting: Family Medicine

## 2019-09-03 ENCOUNTER — Telehealth: Payer: Self-pay | Admitting: *Deleted

## 2019-09-03 NOTE — Telephone Encounter (Signed)
No further treatment or appointment is necessary at this time.  This discharge is probably physiologic, with no odor or irritation.  She'll need an appointment if she develops odor or irritation with her discharge.

## 2019-09-03 NOTE — Telephone Encounter (Signed)
Pt called to office stating that she has been given treatment for both BV and yeast.   Pt states she is still having d/c - denies itch, irritation or odor. Pt states d/c was thin but is now becoming thick and clumpy. Pt made aware she may need appt for d/c check or can message provider for further recommendation.  Pt would like message to be sent to provider.    Please advise on treatment vs. appt.

## 2019-09-04 ENCOUNTER — Encounter: Payer: Self-pay | Admitting: *Deleted

## 2019-09-04 DIAGNOSIS — F331 Major depressive disorder, recurrent, moderate: Secondary | ICD-10-CM | POA: Diagnosis not present

## 2019-09-08 ENCOUNTER — Other Ambulatory Visit: Payer: Self-pay | Admitting: Family Medicine

## 2019-09-08 DIAGNOSIS — I10 Essential (primary) hypertension: Secondary | ICD-10-CM

## 2019-09-13 NOTE — Telephone Encounter (Signed)
Pt called to request a medication refill on [facility] on [address]. Please follow up

## 2019-09-14 DIAGNOSIS — F331 Major depressive disorder, recurrent, moderate: Secondary | ICD-10-CM | POA: Diagnosis not present

## 2019-09-20 DIAGNOSIS — F331 Major depressive disorder, recurrent, moderate: Secondary | ICD-10-CM | POA: Diagnosis not present

## 2019-09-21 ENCOUNTER — Other Ambulatory Visit: Payer: Self-pay | Admitting: Family Medicine

## 2019-09-23 ENCOUNTER — Other Ambulatory Visit: Payer: Self-pay | Admitting: Allergy

## 2019-09-24 NOTE — Telephone Encounter (Signed)
Denied montelukast. Courtesy already given 08/29/19. Pt aware that she needed ov at that time. She needs to schedule a follow up appt for any additional refills.

## 2019-09-26 DIAGNOSIS — L815 Leukoderma, not elsewhere classified: Secondary | ICD-10-CM | POA: Diagnosis not present

## 2019-09-26 DIAGNOSIS — D1801 Hemangioma of skin and subcutaneous tissue: Secondary | ICD-10-CM | POA: Diagnosis not present

## 2019-09-26 DIAGNOSIS — L819 Disorder of pigmentation, unspecified: Secondary | ICD-10-CM | POA: Diagnosis not present

## 2019-09-27 ENCOUNTER — Other Ambulatory Visit (HOSPITAL_COMMUNITY)
Admission: RE | Admit: 2019-09-27 | Discharge: 2019-09-27 | Disposition: A | Discharge status: Home / Self Care | Payer: BC Managed Care – PPO | Source: Ambulatory Visit | Attending: Family Medicine | Admitting: Family Medicine

## 2019-09-27 ENCOUNTER — Ambulatory Visit (HOSPITAL_BASED_OUTPATIENT_CLINIC_OR_DEPARTMENT_OTHER): Payer: BC Managed Care – PPO | Admitting: Family Medicine

## 2019-09-27 ENCOUNTER — Encounter: Payer: Self-pay | Admitting: Family Medicine

## 2019-09-27 ENCOUNTER — Other Ambulatory Visit: Payer: Self-pay

## 2019-09-27 VITALS — BP 139/89 | HR 71 | Temp 96.3°F | Resp 16 | Ht 67.0 in | Wt 157.0 lb

## 2019-09-27 DIAGNOSIS — J309 Allergic rhinitis, unspecified: Secondary | ICD-10-CM | POA: Diagnosis not present

## 2019-09-27 DIAGNOSIS — Z0000 Encounter for general adult medical examination without abnormal findings: Secondary | ICD-10-CM | POA: Diagnosis not present

## 2019-09-27 DIAGNOSIS — Z23 Encounter for immunization: Secondary | ICD-10-CM

## 2019-09-27 DIAGNOSIS — N951 Menopausal and female climacteric states: Secondary | ICD-10-CM

## 2019-09-27 DIAGNOSIS — Z1231 Encounter for screening mammogram for malignant neoplasm of breast: Secondary | ICD-10-CM

## 2019-09-27 DIAGNOSIS — N898 Other specified noninflammatory disorders of vagina: Secondary | ICD-10-CM

## 2019-09-27 LAB — POCT URINALYSIS DIP (CLINITEK)
Bilirubin, UA: NEGATIVE
Blood, UA: NEGATIVE
Glucose, UA: NEGATIVE mg/dL
Ketones, POC UA: NEGATIVE mg/dL
Leukocytes, UA: NEGATIVE
Nitrite, UA: NEGATIVE
POC PROTEIN,UA: NEGATIVE
Spec Grav, UA: >=1.03 — AB (ref 1.010–1.025)
Urobilinogen, UA: 1 E.U./dL
pH, UA: 7 (ref 5.0–8.0)

## 2019-09-27 MED ORDER — LEVOCETIRIZINE DIHYDROCHLORIDE 5 MG PO TABS: 5.0000 mg | ORAL_TABLET | Freq: Every evening | ORAL | 11 refills | Status: DC

## 2019-09-27 MED ORDER — CLONIDINE HCL 0.1 MG PO TABS: 0.1000 mg | ORAL_TABLET | Freq: Every day | ORAL | 3 refills | Status: DC

## 2019-09-27 NOTE — Progress Notes (Signed)
Vaginal irritation x 1 month Having hot increased amount of flashes

## 2019-09-27 NOTE — Patient Instructions (Signed)
Health Maintenance, Female Adopting a healthy lifestyle and getting preventive care are important in promoting health and wellness. Ask your health care provider about:  The right schedule for you to have regular tests and exams.  Things you can do on your own to prevent diseases and keep yourself healthy. What should I know about diet, weight, and exercise? Eat a healthy diet   Eat a diet that includes plenty of vegetables, fruits, low-fat dairy products, and lean protein.  Do not eat a lot of foods that are high in solid fats, added sugars, or sodium. Maintain a healthy weight Body mass index (BMI) is used to identify weight problems. It estimates body fat based on height and weight. Your health care provider can help determine your BMI and help you achieve or maintain a healthy weight. Get regular exercise Get regular exercise. This is one of the most important things you can do for your health. Most adults should:  Exercise for at least 150 minutes each week. The exercise should increase your heart rate and make you sweat (moderate-intensity exercise).  Do strengthening exercises at least twice a week. This is in addition to the moderate-intensity exercise.  Spend less time sitting. Even light physical activity can be beneficial. Watch cholesterol and blood lipids Have your blood tested for lipids and cholesterol at 55 years of age, then have this test every 5 years. Have your cholesterol levels checked more often if:  Your lipid or cholesterol levels are high.  You are older than 55 years of age.  You are at high risk for heart disease. What should I know about cancer screening? Depending on your health history and family history, you may need to have cancer screening at various ages. This may include screening for:  Breast cancer.  Cervical cancer.  Colorectal cancer.  Skin cancer.  Lung cancer. What should I know about heart disease, diabetes, and high blood  pressure? Blood pressure and heart disease  High blood pressure causes heart disease and increases the risk of stroke. This is more likely to develop in people who have high blood pressure readings, are of African descent, or are overweight.  Have your blood pressure checked: ? Every 3-5 years if you are 18-39 years of age. ? Every year if you are 40 years old or older. Diabetes Have regular diabetes screenings. This checks your fasting blood sugar level. Have the screening done:  Once every three years after age 40 if you are at a normal weight and have a low risk for diabetes.  More often and at a younger age if you are overweight or have a high risk for diabetes. What should I know about preventing infection? Hepatitis B If you have a higher risk for hepatitis B, you should be screened for this virus. Talk with your health care provider to find out if you are at risk for hepatitis B infection. Hepatitis C Testing is recommended for:  Everyone born from 1945 through 1965.  Anyone with known risk factors for hepatitis C. Sexually transmitted infections (STIs)  Get screened for STIs, including gonorrhea and chlamydia, if: ? You are sexually active and are younger than 55 years of age. ? You are older than 55 years of age and your health care provider tells you that you are at risk for this type of infection. ? Your sexual activity has changed since you were last screened, and you are at increased risk for chlamydia or gonorrhea. Ask your health care provider if   you are at risk.  Ask your health care provider about whether you are at high risk for HIV. Your health care provider may recommend a prescription medicine to help prevent HIV infection. If you choose to take medicine to prevent HIV, you should first get tested for HIV. You should then be tested every 3 months for as long as you are taking the medicine. Pregnancy  If you are about to stop having your period (premenopausal) and  you may become pregnant, seek counseling before you get pregnant.  Take 400 to 800 micrograms (mcg) of folic acid every day if you become pregnant.  Ask for birth control (contraception) if you want to prevent pregnancy. Osteoporosis and menopause Osteoporosis is a disease in which the bones lose minerals and strength with aging. This can result in bone fractures. If you are 65 years old or older, or if you are at risk for osteoporosis and fractures, ask your health care provider if you should:  Be screened for bone loss.  Take a calcium or vitamin D supplement to lower your risk of fractures.  Be given hormone replacement therapy (HRT) to treat symptoms of menopause. Follow these instructions at home: Lifestyle  Do not use any products that contain nicotine or tobacco, such as cigarettes, e-cigarettes, and chewing tobacco. If you need help quitting, ask your health care provider.  Do not use street drugs.  Do not share needles.  Ask your health care provider for help if you need support or information about quitting drugs. Alcohol use  Do not drink alcohol if: ? Your health care provider tells you not to drink. ? You are pregnant, may be pregnant, or are planning to become pregnant.  If you drink alcohol: ? Limit how much you use to 0-1 drink a day. ? Limit intake if you are breastfeeding.  Be aware of how much alcohol is in your drink. In the [place] one drink equals one 12 oz bottle of beer (355 mL), one 5 oz glass of wine (148 mL), or one 1 oz glass of hard liquor (44 mL). General instructions  Schedule regular health, dental, and eye exams.  Stay current with your vaccines.  Tell your health care provider if: ? You often feel depressed. ? You have ever been abused or do not feel safe at home. Summary  Adopting a healthy lifestyle and getting preventive care are important in promoting health and wellness.  Follow your health care provider's instructions about healthy  diet, exercising, and getting tested or screened for diseases.  Follow your health care provider's instructions on monitoring your cholesterol and blood pressure. This information is not intended to replace advice given to you by your health care provider. Make sure you discuss any questions you have with your health care provider. Document Revised: 03/22/2018 Document Reviewed: 03/22/2018 Elsevier Patient Education  2020 Elsevier Inc.  

## 2019-09-27 NOTE — Progress Notes (Signed)
Subjective:  Patient ID: Erica [name], female    DOB: 01-07-1966  Age: 54 y.o. MRN: [id number]  CC: Annual Exam   HPI [name] presents for an annual physical exam. She is needs a mammogram. Up to date on Colonoscopy and PAP smear. She is requesting a refill of her Xyzal.  Today she complains of hot flashes which occurs on most nights and she is wondering if she can have something for it. She also complains of vaginal irritation. Treated by GYN with 'a cream and pill' but then symptoms return.She has no urinary symptoms.  Past Medical History:  Diagnosis Date  . Anemia   . Depression   . Essential hypertension, benign   . Fibroids   . GERD (gastroesophageal reflux disease)   . HH (hiatus hernia)   . Hx of adenomatous colonic polyps   . IBS (irritable bowel syndrome)   . Osteopenia 10/2016  . SUI (stress urinary incontinence, female) 09/29/2016    Past Surgical History:  Procedure Laterality Date  . COLONOSCOPY  01/01/2005   Normal   . POLYPECTOMY    . UTERINE FIBROID EMBOLIZATION      Family History  Problem Relation Age of Onset  . Hodgkin's lymphoma Mother   . Alzheimer's disease Father   . Colon cancer Maternal Grandfather   . Stroke Paternal Grandfather   . Breast cancer Sister   . Breast cancer Maternal Aunt   . Esophageal cancer Neg Hx   . Stomach cancer Neg Hx   . Rectal cancer Neg Hx     No Known Allergies  Outpatient Medications Prior to Visit  Medication Sig Dispense Refill  . amLODipine (NORVASC) 5 MG tablet TAKE 1 TAB BY MOUTH DAILY 90 tablet 0  . famotidine (PEPCID) 20 MG tablet Take 1 tablet (20 mg total) by mouth 2 (two) times daily. To reduce stomach acid 60 tablet 11  . linaCLOtide (LINZESS PO) Take by mouth.    Marland Kitchen lisinopril (ZESTRIL) 20 MG tablet Take 1 tablet (20 mg total) by mouth daily. 90 tablet 3  . Multiple Vitamins-Minerals (CENTRUM SILVER 50+WOMEN PO) Take by mouth.    . levocetirizine (XYZAL) 5 MG tablet Take  1 tablet (5 mg total) by mouth every evening. For nasal congestion (Patient not taking: Reported on 09/27/2019) 30 tablet 11  . metroNIDAZOLE (METROGEL) 0.75 % vaginal gel Place 1 Applicatorful vaginally at bedtime. Apply one applicatorful to vagina at bedtime for 5 days 70 g 1  . montelukast (SINGULAIR) 10 MG tablet TAKE 1 TABLET BY MOUTH EVERYDAY AT BEDTIME (Patient not taking: Reported on 09/27/2019) 30 tablet 0   No facility-administered medications prior to visit.     ROS Review of Systems  Constitutional: Negative for activity change, appetite change and fatigue.  HENT: Negative for congestion, sinus pressure and sore throat.   Eyes: Negative for visual disturbance.  Respiratory: Negative for cough, chest tightness, shortness of breath and wheezing.   Cardiovascular: Negative for chest pain and palpitations.  Gastrointestinal: Negative for abdominal distention, abdominal pain and constipation.  Endocrine: Negative for polydipsia.  Genitourinary: Negative for dysuria, frequency, vaginal discharge and vaginal pain.  Musculoskeletal: Negative for arthralgias and back pain.  Skin: Negative for rash.  Neurological: Negative for tremors, light-headedness and numbness.  Hematological: Does not bruise/bleed easily.  Psychiatric/Behavioral: Negative for agitation and behavioral problems.    Objective:  BP 139/89 (BP Location: Left Arm, Patient Position: Sitting, Cuff Size: Normal)   Pulse 71   Temp Marland Kitchen)  96.3 F (35.7 C)   Resp 16   Ht 5\' 7"  (1.702 m)   Wt 157 lb (71.2 kg)   LMP  (LMP Unknown)   SpO2 100%   BMI 24.59 kg/m   BP/Weight 09/27/2019 06/15/2019 16/04/930  Systolic BP 355 732 202  Diastolic BP 89 82 80  Wt. (Lbs) 157 162 154.8  BMI 24.59 25 24.25      Physical Exam Constitutional:      Appearance: She is well-developed.  Neck:     Vascular: No JVD.  Cardiovascular:     Rate and Rhythm: Normal rate.     Heart sounds: Normal heart sounds. No murmur heard.    Pulmonary:     Effort: Pulmonary effort is normal.     Breath sounds: Normal breath sounds. No wheezing or rales.  Chest:     Chest wall: No tenderness.  Abdominal:     General: Bowel sounds are normal. There is no distension.     Palpations: Abdomen is soft. There is no mass.     Tenderness: There is no abdominal tenderness.  Musculoskeletal:        General: Normal range of motion.     Right lower leg: No edema.     Left lower leg: No edema.  Neurological:     Mental Status: She is alert and oriented to person, place, and time.  Psychiatric:        Mood and Affect: Mood normal.     CMP Latest Ref Rng & Units 06/15/2019 09/22/2018 08/10/2018  Glucose 65 - 99 mg/dL 76 96 93  BUN 6 - 24 mg/dL 13 9 12   Creatinine 0.57 - 1.00 mg/dL 0.91 0.92 0.94  Sodium 134 - 144 mmol/L 141 141 139  Potassium 3.5 - 5.2 mmol/L 4.5 4.1 3.6  Chloride 96 - 106 mmol/L 104 101 100  CO2 20 - 29 mmol/L 25 24 26   Calcium 8.7 - 10.2 mg/dL 10.2 10.4(H) 10.0  Total Protein 6.5 - 8.1 g/dL - - 7.5  Total Bilirubin 0.3 - 1.2 mg/dL - - 0.4  Alkaline Phos 38 - 126 U/L - - 62  AST 15 - 41 U/L - - 24  ALT 0 - 44 U/L - - 23    Lipid Panel     Component Value Date/Time   CHOL 181 05/02/2017 0941   TRIG 56.0 05/02/2017 0941   HDL 64.70 05/02/2017 0941   CHOLHDL 3 05/02/2017 0941   VLDL 11.2 05/02/2017 0941   LDLCALC 105 (H) 05/02/2017 0941    CBC    Component Value Date/Time   WBC 5.1 08/10/2018 1633   RBC 4.39 08/10/2018 1633   HGB 13.1 08/10/2018 1633   HGB 13.0 04/30/2013 1105   HCT 39.1 08/10/2018 1633   HCT 40.2 04/30/2013 1105   PLT 187 08/10/2018 1633   PLT 175 04/30/2013 1105   MCV 89.1 08/10/2018 1633   MCV 88.3 04/30/2013 1105   MCH 29.8 08/10/2018 1633   MCHC 33.5 08/10/2018 1633   RDW 12.7 08/10/2018 1633   RDW 15.3 (H) 04/30/2013 1105   LYMPHSABS 2.4 08/10/2018 1633   LYMPHSABS 1.9 04/30/2013 1105   MONOABS 0.3 08/10/2018 1633   MONOABS 0.5 04/30/2013 1105   EOSABS 0.1  08/10/2018 1633   EOSABS 0.1 04/30/2013 1105   BASOSABS 0.0 08/10/2018 1633   BASOSABS 0.0 04/30/2013 1105    Lab Results  Component Value Date   HGBA1C 5.3 06/15/2019    Assessment & Plan:  1.  Annual physical exam Counseled on 150 minutes of exercise per week, healthy eating (including decreased daily intake of saturated fats, cholesterol, added sugars, sodium), routine healthcare maintenance.  - CBC with Differential/Platelet - Lipid panel - VITAMIN D 25 Hydroxy (Vit-D Deficiency, Fractures)  2. Allergic rhinitis, unspecified seasonality, unspecified trigger Stable - levocetirizine (XYZAL) 5 MG tablet; Take 1 tablet (5 mg total) by mouth every evening. For nasal congestion  Dispense: 30 tablet; Refill: 11  3. Vaginal irritation Previously treated UA neg for UTI - POCT URINALYSIS DIP (CLINITEK) - Cervicovaginal ancillary only  4. Encounter for screening mammogram for malignant neoplasm of breast - [place] DIGITAL SCREENING BILATERAL; Future  5. Vasomotor symptoms due to menopause Discussed risks and benefits of HRT versus other treament options After joint decision making she has decided to go with Clonidine. - cloNIDine (CATAPRES) 0.1 MG tablet; Take 1 tablet (0.1 mg total) by mouth at bedtime.  Dispense: 30 tablet; Refill: 3   Meds ordered this encounter  Medications  . levocetirizine (XYZAL) 5 MG tablet    Sig: Take 1 tablet (5 mg total) by mouth every evening. For nasal congestion    Dispense:  30 tablet    Refill:  11  . cloNIDine (CATAPRES) 0.1 MG tablet    Sig: Take 1 tablet (0.1 mg total) by mouth at bedtime.    Dispense:  30 tablet    Refill:  3    Follow-up: Return in about 3 months (around 12/28/2019) for chronic disease management with PCP.       Charlott Rakes, MD, FAAFP. [facility] [place], [place]   09/27/2019, 12:05 PM

## 2019-09-28 ENCOUNTER — Other Ambulatory Visit: Payer: Self-pay | Admitting: Family Medicine

## 2019-09-28 LAB — CBC WITH DIFFERENTIAL/PLATELET
Basophils Absolute: 0 10*3/uL (ref 0.0–0.2)
Basos: 0 %
EOS (ABSOLUTE): 0 10*3/uL (ref 0.0–0.4)
Eos: 0 %
Hematocrit: 41.3 % (ref 34.0–46.6)
Hemoglobin: 13.9 g/dL (ref 11.1–15.9)
Immature Grans (Abs): 0 10*3/uL (ref 0.0–0.1)
Immature Granulocytes: 0 %
Lymphocytes Absolute: 2.4 10*3/uL (ref 0.7–3.1)
Lymphs: 50 %
MCH: 29.9 pg (ref 26.6–33.0)
MCHC: 33.7 g/dL (ref 31.5–35.7)
MCV: 89 fL (ref 79–97)
Monocytes Absolute: 0.4 10*3/uL (ref 0.1–0.9)
Monocytes: 8 %
Neutrophils Absolute: 2.1 10*3/uL (ref 1.4–7.0)
Neutrophils: 42 %
Platelets: 181 10*3/uL (ref 150–450)
RBC: 4.65 x10E6/uL (ref 3.77–5.28)
RDW: 12.7 % (ref 11.7–15.4)
WBC: 4.9 10*3/uL (ref 3.4–10.8)

## 2019-09-28 LAB — LIPID PANEL
Chol/HDL Ratio: 2.8 ratio (ref 0.0–4.4)
Cholesterol, Total: 178 mg/dL (ref 100–199)
HDL: 63 mg/dL (ref >39)
LDL Chol Calc (NIH): 104 mg/dL — ABNORMAL HIGH (ref 0–99)
Triglycerides: 59 mg/dL (ref 0–149)
VLDL Cholesterol Cal: 11 mg/dL (ref 5–40)

## 2019-09-28 LAB — CERVICOVAGINAL ANCILLARY ONLY
Bacterial Vaginitis (gardnerella): NEGATIVE
Candida Glabrata: NEGATIVE
Candida Vaginitis: NEGATIVE
Chlamydia: NEGATIVE
Comment: NEGATIVE
Comment: NEGATIVE
Comment: NEGATIVE
Comment: NEGATIVE
Comment: NEGATIVE
Comment: NORMAL
Neisseria Gonorrhea: NEGATIVE
Trichomonas: NEGATIVE

## 2019-09-28 LAB — VITAMIN D 25 HYDROXY (VIT D DEFICIENCY, FRACTURES): Vit D, 25-Hydroxy: 26.3 ng/mL — ABNORMAL LOW (ref 30.0–100.0)

## 2019-09-28 MED ORDER — ERGOCALCIFEROL 1.25 MG (50000 UT) PO CAPS: 50000.0000 [IU] | ORAL_CAPSULE | ORAL | 0 refills | Status: DC

## 2019-10-02 ENCOUNTER — Other Ambulatory Visit: Payer: Self-pay | Admitting: Family Medicine

## 2019-10-04 DIAGNOSIS — F331 Major depressive disorder, recurrent, moderate: Secondary | ICD-10-CM | POA: Diagnosis not present

## 2019-10-16 DIAGNOSIS — F331 Major depressive disorder, recurrent, moderate: Secondary | ICD-10-CM | POA: Diagnosis not present

## 2019-10-22 ENCOUNTER — Ambulatory Visit (INDEPENDENT_AMBULATORY_CARE_PROVIDER_SITE_OTHER): Payer: BC Managed Care – PPO | Admitting: Obstetrics

## 2019-10-22 ENCOUNTER — Other Ambulatory Visit (HOSPITAL_COMMUNITY)
Admission: RE | Admit: 2019-10-22 | Discharge: 2019-10-22 | Disposition: A | Discharge status: Home / Self Care | Payer: BC Managed Care – PPO | Source: Ambulatory Visit | Attending: Obstetrics | Admitting: Obstetrics

## 2019-10-22 ENCOUNTER — Other Ambulatory Visit: Payer: Self-pay

## 2019-10-22 ENCOUNTER — Encounter: Payer: Self-pay | Admitting: Obstetrics

## 2019-10-22 VITALS — BP 129/81 | HR 73 | Ht 67.5 in | Wt 159.6 lb

## 2019-10-22 DIAGNOSIS — L814 Other melanin hyperpigmentation: Secondary | ICD-10-CM

## 2019-10-22 DIAGNOSIS — Z1239 Encounter for other screening for malignant neoplasm of breast: Secondary | ICD-10-CM | POA: Diagnosis not present

## 2019-10-22 DIAGNOSIS — Z01419 Encounter for gynecological examination (general) (routine) without abnormal findings: Secondary | ICD-10-CM | POA: Insufficient documentation

## 2019-10-22 DIAGNOSIS — N898 Other specified noninflammatory disorders of vagina: Secondary | ICD-10-CM | POA: Insufficient documentation

## 2019-10-22 NOTE — Progress Notes (Signed)
Pt is in the office for annual. Last pap 10-19-18 Last mammogram 10-05-18 GAD-7= 0

## 2019-10-23 ENCOUNTER — Encounter: Payer: Self-pay | Admitting: Obstetrics

## 2019-10-23 NOTE — Progress Notes (Addendum)
Subjective:        Erica [name] is a 54 y.o. female here for a routine exam.  Current complaints: None.    Personal health questionnaire:  Is patient [name], have a family history of breast and/or ovarian cancer: yes Is there a family history of uterine cancer diagnosed at age < 76, gastrointestinal cancer, urinary tract cancer, family member who is a Field seismologist syndrome-associated carrier: yes Is the patient overweight and hypertensive, family history of diabetes, personal history of gestational diabetes, preeclampsia or PCOS: no Is patient over 39, have PCOS,  family history of premature CHD under age 67, diabetes, smoke, have hypertension or peripheral artery disease:  no At any time, has a partner hit, kicked or otherwise hurt or frightened you?: no Over the past 2 weeks, have you felt down, depressed or hopeless?: no Over the past 2 weeks, have you felt little interest or pleasure in doing things?:no   Gynecologic History No LMP recorded (lmp unknown). Patient is postmenopausal. Contraception: post menopausal status Last Pap: 10-19-2018. Results were: normal Last mammogram: 2019. Results were: normal  Obstetric History OB History  No obstetric history on file.    Past Medical History:  Diagnosis Date  . Anemia   . Depression   . Essential hypertension, benign   . Fibroids   . GERD (gastroesophageal reflux disease)   . HH (hiatus hernia)   . Hx of adenomatous colonic polyps   . IBS (irritable bowel syndrome)   . Osteopenia 10/2016  . SUI (stress urinary incontinence, female) 09/29/2016    Past Surgical History:  Procedure Laterality Date  . COLONOSCOPY  01/01/2005   Normal   . POLYPECTOMY    . UTERINE FIBROID EMBOLIZATION       Current Outpatient Medications:  .  amLODipine (NORVASC) 5 MG tablet, TAKE 1 TAB BY MOUTH DAILY, Disp: 90 tablet, Rfl: 0 .  cloNIDine (CATAPRES) 0.1 MG tablet, Take 1 tablet (0.1 mg total) by mouth at bedtime., Disp: 30  tablet, Rfl: 3 .  ergocalciferol (DRISDOL) 1.25 MG (50000 UT) capsule, Take 1 capsule (50,000 Units total) by mouth once a week., Disp: 12 capsule, Rfl: 0 .  famotidine (PEPCID) 20 MG tablet, Take 1 tablet (20 mg total) by mouth 2 (two) times daily. To reduce stomach acid, Disp: 60 tablet, Rfl: 11 .  lisinopril (ZESTRIL) 20 MG tablet, TAKE 1 TABLET BY MOUTH EVERY DAY, Disp: 30 tablet, Rfl: 0 .  Multiple Vitamins-Minerals (CENTRUM SILVER 50+WOMEN PO), Take by mouth., Disp: , Rfl:  .  levocetirizine (XYZAL) 5 MG tablet, Take 1 tablet (5 mg total) by mouth every evening. For nasal congestion (Patient not taking: Reported on 10/22/2019), Disp: 30 tablet, Rfl: 11 .  linaCLOtide (LINZESS PO), Take by mouth. (Patient not taking: Reported on 10/22/2019), Disp: , Rfl:  No Known Allergies  Social History   Tobacco Use  . Smoking status: Never Smoker  . Smokeless tobacco: Never Used  Substance Use Topics  . Alcohol use: No    Alcohol/week: 0.0 standard drinks    Comment: occ    Family History  Problem Relation Age of Onset  . Hodgkin's lymphoma Mother   . Alzheimer's disease Father   . Colon cancer Maternal Grandfather   . Stroke Paternal Grandfather   . Breast cancer Sister   . Breast cancer Maternal Aunt   . Esophageal cancer Neg Hx   . Stomach cancer Neg Hx   . Rectal cancer Neg Hx  Review of Systems  Constitutional: negative for fatigue and weight loss Respiratory: negative for cough and wheezing Cardiovascular: negative for chest pain, fatigue and palpitations Gastrointestinal: negative for abdominal pain and change in bowel habits Musculoskeletal:negative for myalgias Neurological: negative for gait problems and tremors Behavioral/Psych: negative for abusive relationship, depression Endocrine: negative for temperature intolerance    Genitourinary:negative for abnormal menstrual periods, genital lesions, hot flashes, sexual problems and vaginal discharge Integument/breast:  positive for dark spots on face. negative for breast lump, breast tenderness, nipple discharge and skin lesion(s)    Objective:       BP 129/81   Pulse 73   Ht 5' 7.5" (1.715 m)   Wt 159 lb 9.6 oz (72.4 kg)   LMP  (LMP Unknown)   BMI 24.63 kg/m  General:   alert and no distress  Skin:   no rash or abnormalities  Lungs:   clear to auscultation bilaterally  Heart:   regular rate and rhythm, S1, S2 normal, no murmur, click, rub or gallop  Breasts:   normal without suspicious masses, skin or nipple changes or axillary nodes  Abdomen:  normal findings: no organomegaly, soft, non-tender and no hernia  Pelvis:  External genitalia: normal general appearance Urinary system: urethral meatus normal and bladder without fullness, nontender Vaginal: normal without tenderness, induration or masses Cervix: normal appearance Adnexa: normal bimanual exam Uterus: anteverted and non-tender, normal size   Lab Review Urine pregnancy test Labs reviewed yes Radiologic studies reviewed yes  50% of 25 min visit spent on counseling and coordination of care.   Assessment:    Healthy female exam.    Plan:   Follow up in 1 year   Education reviewed: calcium supplements, depression evaluation, low fat, low cholesterol diet, safe sex/STD prevention, self breast exams and weight bearing exercise.    Orders Placed This Encounter  Procedures  . MM Digital Screening    Standing Status:   Future    Standing Expiration Date:   10/21/2020    Order Specific Question:   Reason for Exam (SYMPTOM  OR DIAGNOSIS REQUIRED)    Answer:   Screening    Order Specific Question:   Is the patient pregnant?    Answer:   No    Order Specific Question:   Preferred imaging location?    Answer:   [facility]  . Ambulatory referral to Dermatology    Referral Priority:   Routine    Referral Type:   Consultation    Referral Reason:   Specialty Services Required    Requested Specialty:   Dermatology    Number of  Visits Requested:   1    Shelly Bombard, MD 10-22-2019

## 2019-10-23 NOTE — Addendum Note (Signed)
Addended by: Tristan Schroeder D on: 10/23/2019 01:59 PM   Modules accepted: Orders

## 2019-10-24 LAB — CERVICOVAGINAL ANCILLARY ONLY
Bacterial Vaginitis (gardnerella): NEGATIVE
Candida Glabrata: NEGATIVE
Candida Vaginitis: NEGATIVE
Comment: NEGATIVE
Comment: NEGATIVE
Comment: NEGATIVE

## 2019-10-25 ENCOUNTER — Other Ambulatory Visit: Payer: Self-pay | Admitting: Family Medicine

## 2019-10-26 ENCOUNTER — Telehealth: Payer: Self-pay | Admitting: Family Medicine

## 2019-10-26 LAB — CYTOLOGY - PAP
Comment: NEGATIVE
Diagnosis: NEGATIVE
High risk HPV: NEGATIVE

## 2019-10-26 NOTE — Telephone Encounter (Signed)
Patient is calling to discuss a bill that she receive with DOS for 09/27/19- Patient was supposed to be seen for a CPE. Patient was told that Dr. Chapman Fitch was out of the office. Patient was asked was she  ok to be seen by Dr. Margarita Rana bc Dr. Chapman Fitch was out. And she was scheduled with Dr. Margarita Rana. Patient received a bill for $303.00. Patient was unsure why she received a bill because she was supposed to be seen for a CPE. After Visit summary indicate patient was seen for vaginal irritation. Patient is calling to request the OV recoded to reflect a CPE. Please advise CB- [phone]

## 2019-10-26 NOTE — Telephone Encounter (Signed)
Verify if visit / procedure / labs done were for CPE per pt requesting codes for physical.

## 2019-10-26 NOTE — Telephone Encounter (Signed)
Patient was called and informed that she did have a physical that day and all her labs were coded for a physical except vaginal swab and UA.  Patient states that she will reach back out to her insurance company.

## 2019-10-29 DIAGNOSIS — K219 Gastro-esophageal reflux disease without esophagitis: Secondary | ICD-10-CM | POA: Diagnosis not present

## 2019-10-30 DIAGNOSIS — F331 Major depressive disorder, recurrent, moderate: Secondary | ICD-10-CM | POA: Diagnosis not present

## 2019-11-06 ENCOUNTER — Other Ambulatory Visit: Payer: Self-pay

## 2019-11-06 ENCOUNTER — Ambulatory Visit
Admission: RE | Admit: 2019-11-06 | Discharge: 2019-11-06 | Disposition: A | Discharge status: Home / Self Care | Payer: BC Managed Care – PPO | Source: Ambulatory Visit | Attending: Obstetrics | Admitting: Obstetrics

## 2019-11-06 DIAGNOSIS — Z1239 Encounter for other screening for malignant neoplasm of breast: Secondary | ICD-10-CM

## 2019-11-06 DIAGNOSIS — Z1231 Encounter for screening mammogram for malignant neoplasm of breast: Secondary | ICD-10-CM | POA: Diagnosis not present

## 2019-11-13 ENCOUNTER — Telehealth: Payer: Self-pay

## 2019-11-13 NOTE — Telephone Encounter (Signed)
Can you please assist patient with the number to billing, received a CRM in reference to her receiving a bill.

## 2019-11-20 DIAGNOSIS — F331 Major depressive disorder, recurrent, moderate: Secondary | ICD-10-CM | POA: Diagnosis not present

## 2019-12-04 DIAGNOSIS — F331 Major depressive disorder, recurrent, moderate: Secondary | ICD-10-CM | POA: Diagnosis not present

## 2019-12-08 DIAGNOSIS — Z20822 Contact with and (suspected) exposure to covid-19: Secondary | ICD-10-CM | POA: Diagnosis not present

## 2019-12-09 DIAGNOSIS — Z20822 Contact with and (suspected) exposure to covid-19: Secondary | ICD-10-CM | POA: Diagnosis not present

## 2019-12-13 ENCOUNTER — Other Ambulatory Visit: Payer: Self-pay | Admitting: Family Medicine

## 2019-12-13 NOTE — Telephone Encounter (Signed)
Requested medication (s) are due for refill today: yes   Requested medication (s) are on the active medication list: yes   Last refill: 09/28/2019  Future visit scheduled: yes   Notes to clinic:  this refill cannot be delegated    Requested Prescriptions  Pending Prescriptions Disp Refills   Vitamin D, Ergocalciferol, (DRISDOL) 1.25 MG (50000 UNIT) CAPS capsule [Pharmacy Med Name: VITAMIN D2 1.25MG (50,000 UNIT)] 12 capsule 0    Sig: TAKE 1 CAPSULE BY MOUTH ONE TIME PER WEEK      Endocrinology:  Vitamins - Vitamin D Supplementation Failed - 12/13/2019  2:33 PM      Failed - 50,000 IU strengths are not delegated      Failed - Phosphate in normal range and within 360 days    No results found for: PHOS        Failed - Vitamin D in normal range and within 360 days    VITD  Date Value Ref Range Status  11/23/2017 31.87 30.00 - 100.00 ng/mL Final   Vit D, 25-Hydroxy  Date Value Ref Range Status  09/27/2019 26.3 (L) 30.0 - 100.0 ng/mL Final    Comment:    Vitamin D deficiency has been defined by the Institute of Medicine and an Endocrine Society practice guideline as a level of serum 25-OH vitamin D less than 20 ng/mL (1,2). The Endocrine Society went on to further define vitamin D insufficiency as a level between 21 and 29 ng/mL (2). 1. IOM (Institute of Medicine). 2010. Dietary reference    intakes for calcium and D. [place]: The    [employer]. 2. Holick MF, Binkley Lisman, Bischoff-Ferrari HA, et al.    Evaluation, treatment, and prevention of vitamin D    deficiency: an Endocrine Society clinical practice    guideline. JCEM. 2011 Jul; 96(7):1911-30.           Passed - Ca in normal range and within 360 days    Calcium  Date Value Ref Range Status  06/15/2019 10.2 8.7 - 10.2 mg/dL Final  04/30/2013 9.8 8.4 - 10.4 mg/dL Final   Calcium, Ion  Date Value Ref Range Status  10/07/2010 1.25 1.12 - 1.32 mmol/L Final          Passed - Valid encounter within  last 12 months    Recent Outpatient Visits           2 months ago Vaginal irritation   [place], Charlane Ferretti, MD   6 months ago Chronic constipation   [facility] [place], [place], MD   1 year ago Essential hypertension   [facility] [place], [place], MD   1 year ago Essential hypertension   [facility], [place], MD   1 year ago [place], MD       Future Appointments             In 2 weeks Charlott Rakes, MD [place]

## 2019-12-14 ENCOUNTER — Other Ambulatory Visit: Payer: Self-pay | Admitting: Family Medicine

## 2019-12-14 DIAGNOSIS — I10 Essential (primary) hypertension: Secondary | ICD-10-CM

## 2019-12-14 MED ORDER — AMLODIPINE BESYLATE 5 MG PO TABS: 5.0000 mg | ORAL_TABLET | Freq: Every day | ORAL | 1 refills | Status: DC

## 2019-12-14 NOTE — Telephone Encounter (Signed)
Patient states pharmacy has reached out several times requesting amLODipine (NORVASC) 5 MG tablet, informed please allow 48 to 72 hour turn around time  [id number] [address], [place] [address]  [place], [place] [address]

## 2019-12-14 NOTE — Telephone Encounter (Signed)
Requested medication (s) are due for refill today: yes  Requested medication (s) are on the active medication list: yes  Future visit scheduled: yes  Notes to clinic: medication was filled by a different provider Review for refill under Dr. Margarita Rana    Requested Prescriptions  Pending Prescriptions Disp Refills   amLODipine (NORVASC) 5 MG tablet 90 tablet 0    Sig: Take 1 tablet (5 mg total) by mouth daily.      Cardiovascular:  Calcium Channel Blockers Passed - 12/14/2019  3:25 PM      Passed - Last BP in normal range    BP Readings from Last 1 Encounters:  10/22/19 129/81          Passed - Valid encounter within last 6 months    Recent Outpatient Visits           2 months ago Vaginal irritation   [place], Charlane Ferretti, MD   6 months ago Chronic constipation   [facility] [place], [place], [place], [place]   1 year ago Essential hypertension   [facility] [place], [place], MD   1 year ago Essential hypertension   [facility] [place], [place], MD   1 year ago [place], MD       Future Appointments             In 2 weeks Charlott Rakes, MD [place]

## 2019-12-21 ENCOUNTER — Other Ambulatory Visit: Payer: Self-pay | Admitting: Family Medicine

## 2019-12-21 DIAGNOSIS — N951 Menopausal and female climacteric states: Secondary | ICD-10-CM

## 2019-12-27 DIAGNOSIS — F331 Major depressive disorder, recurrent, moderate: Secondary | ICD-10-CM | POA: Diagnosis not present

## 2019-12-31 ENCOUNTER — Ambulatory Visit: Payer: BC Managed Care – PPO | Attending: Family Medicine | Admitting: Family Medicine

## 2019-12-31 ENCOUNTER — Other Ambulatory Visit: Payer: Self-pay

## 2019-12-31 DIAGNOSIS — I10 Essential (primary) hypertension: Secondary | ICD-10-CM

## 2019-12-31 MED ORDER — LISINOPRIL-HYDROCHLOROTHIAZIDE 20-25 MG PO TABS: 1.0000 | ORAL_TABLET | Freq: Every day | ORAL | 3 refills | Status: DC

## 2019-12-31 NOTE — Progress Notes (Signed)
Patient wants to discuss the reason for 3 blood pressure medications and her pressure is still high.

## 2019-12-31 NOTE — Progress Notes (Signed)
Virtual Visit via Telephone Note  I connected with Erica [name], on 12/31/2019 at 4:21 PM by telephone due to the COVID-19 pandemic and verified that I am speaking with the correct person using two identifiers.   Consent: I discussed the limitations, risks, security and privacy concerns of performing an evaluation and management service by telephone and the availability of in person appointments. I also discussed with the patient that there may be a patient responsible charge related to this service. The patient expressed understanding and agreed to proceed.   Location of Patient: Home  Location of Provider: Clinic   Persons participating in Telemedicine visit: Shanessa Demi Trieu Farrington-CMA Dr. Margarita Rana     History of Present Illness: 54 year old female with a history of hypertension who is seen for follow-up visit today. Currently on amlodipine and lisinopril for hypertension she is on clonidine for vasomotor menopausal symptoms. She is wondering if her medications can be consolidated so she can take one blood pressure medication. Review of her blood pressures-139/89 at last visit in 09/2019 and prior to that was 125/82. She has no means of obtaining ambulatory blood pressures as her blood pressure monitor is broken.  Past Medical History:  Diagnosis Date  . Anemia   . Depression   . Essential hypertension, benign   . Fibroids   . GERD (gastroesophageal reflux disease)   . HH (hiatus hernia)   . Hx of adenomatous colonic polyps   . IBS (irritable bowel syndrome)   . Osteopenia 10/2016  . SUI (stress urinary incontinence, female) 09/29/2016   No Known Allergies  Current Outpatient Medications on File Prior to Visit  Medication Sig Dispense Refill  . amLODipine (NORVASC) 5 MG tablet Take 1 tablet (5 mg total) by mouth daily. 90 tablet 1  . cloNIDine (CATAPRES) 0.1 MG tablet TAKE 1 TABLET (0.1 MG TOTAL) BY MOUTH AT BEDTIME. 90 tablet 0  . famotidine  (PEPCID) 20 MG tablet Take 1 tablet (20 mg total) by mouth 2 (two) times daily. To reduce stomach acid 60 tablet 11  . lisinopril (ZESTRIL) 20 MG tablet TAKE 1 TABLET BY MOUTH EVERY DAY 90 tablet 1  . Multiple Vitamins-Minerals (CENTRUM SILVER 50+WOMEN PO) Take by mouth.    . ergocalciferol (DRISDOL) 1.25 MG (50000 UT) capsule Take 1 capsule (50,000 Units total) by mouth once a week. (Patient not taking: Reported on 12/31/2019) 12 capsule 0  . levocetirizine (XYZAL) 5 MG tablet Take 1 tablet (5 mg total) by mouth every evening. For nasal congestion (Patient not taking: Reported on 10/22/2019) 30 tablet 11  . linaCLOtide (LINZESS PO) Take by mouth. (Patient not taking: Reported on 10/22/2019)     No current facility-administered medications on file prior to visit.    Observations/Objective: Awake, alert, oriented x3 Not in acute distress  Assessment and Plan: 1. Essential hypertension Advised that we can consolidate her antihypertensive and she has.  Placed on lisinopril/hydrochlorothiazide which will substitute both amlodipine and lisinopril.  Unable to increase lisinopril from 20 mg to 40 mg as she might become hyperkalemic (last potassium 4.6) and just amlodipine 10 mg might be insufficient for optimization of blood pressure. Educated that clonidine is for her vasomotor symptoms of menopause to be used nightly as needed and if titrated further to be used during the day she might experience sedating side effects. After joint decision-making she has agreed to trial of lisinopril/HCTZ and will return in 1 month for evaluation of her blood pressure. - lisinopril-hydrochlorothiazide (ZESTORETIC) 20-25 MG tablet; Take  1 tablet by mouth daily.  Dispense: 30 tablet; Refill: 3   Follow Up Instructions: 1 month with PCP for blood pressure management   I discussed the assessment and treatment plan with the patient. The patient was provided an opportunity to ask questions and all were answered. The  patient agreed with the plan and demonstrated an understanding of the instructions.   The patient was advised to call back or seek an in-person evaluation if the symptoms worsen or if the condition fails to improve as anticipated.     I provided 12 minutes total of non-face-to-face time during this encounter including median intraservice time, reviewing previous notes, investigations, ordering medications, medical decision making, coordinating care and patient verbalized understanding at the end of the visit.     Charlott Rakes, MD, FAAFP. [facility] [place], [place]   12/31/2019, 4:21 PM

## 2020-01-01 DIAGNOSIS — F331 Major depressive disorder, recurrent, moderate: Secondary | ICD-10-CM | POA: Diagnosis not present

## 2020-01-15 DIAGNOSIS — F331 Major depressive disorder, recurrent, moderate: Secondary | ICD-10-CM | POA: Diagnosis not present

## 2020-01-29 IMAGING — CR CHEST - 2 VIEW
2 series · 2 of 2 positions shown · non-contrast
Comparison: 08/02/2018

CLINICAL DATA: Burning in chest and throat 2 months.

EXAM:
CHEST - 2 VIEW

[chest pa]
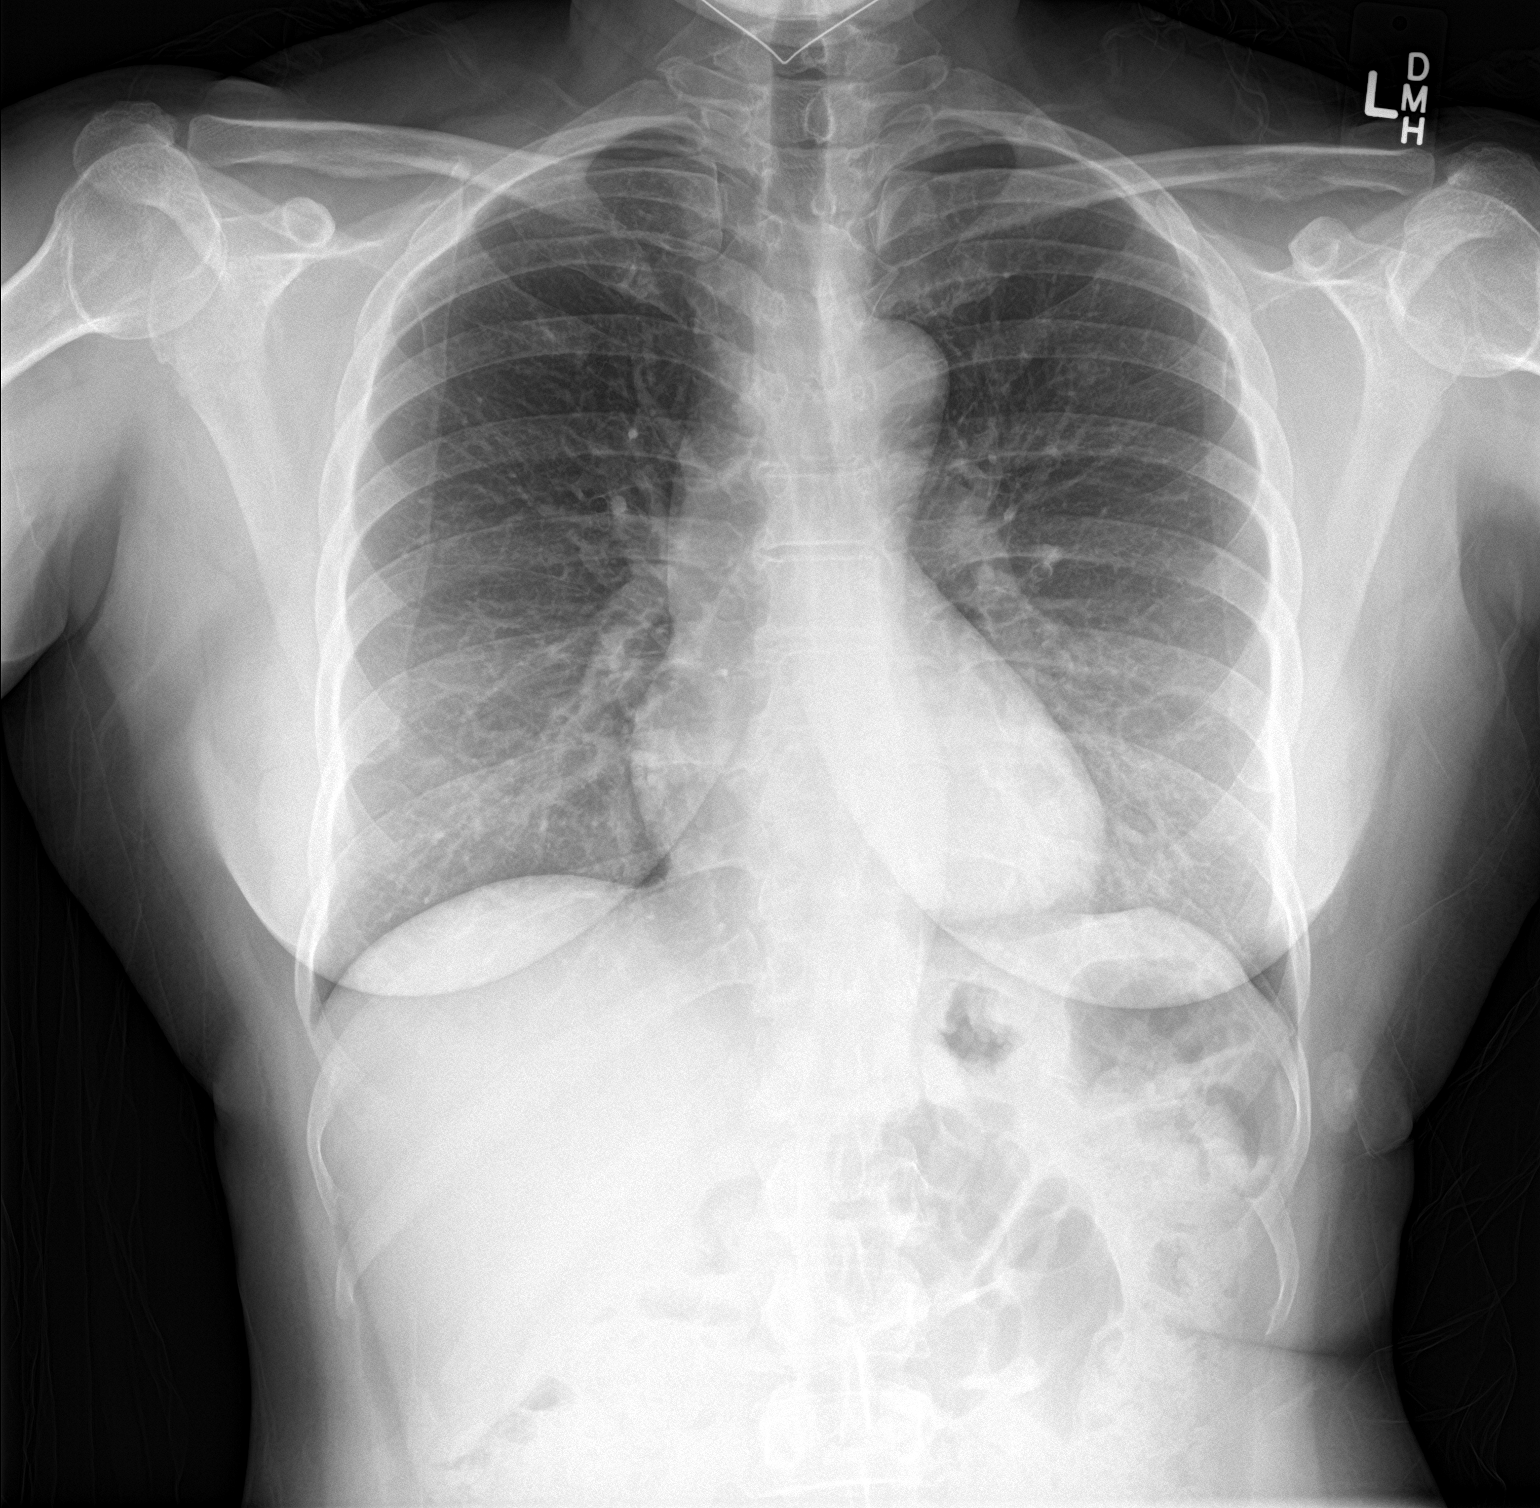

[chest lat]
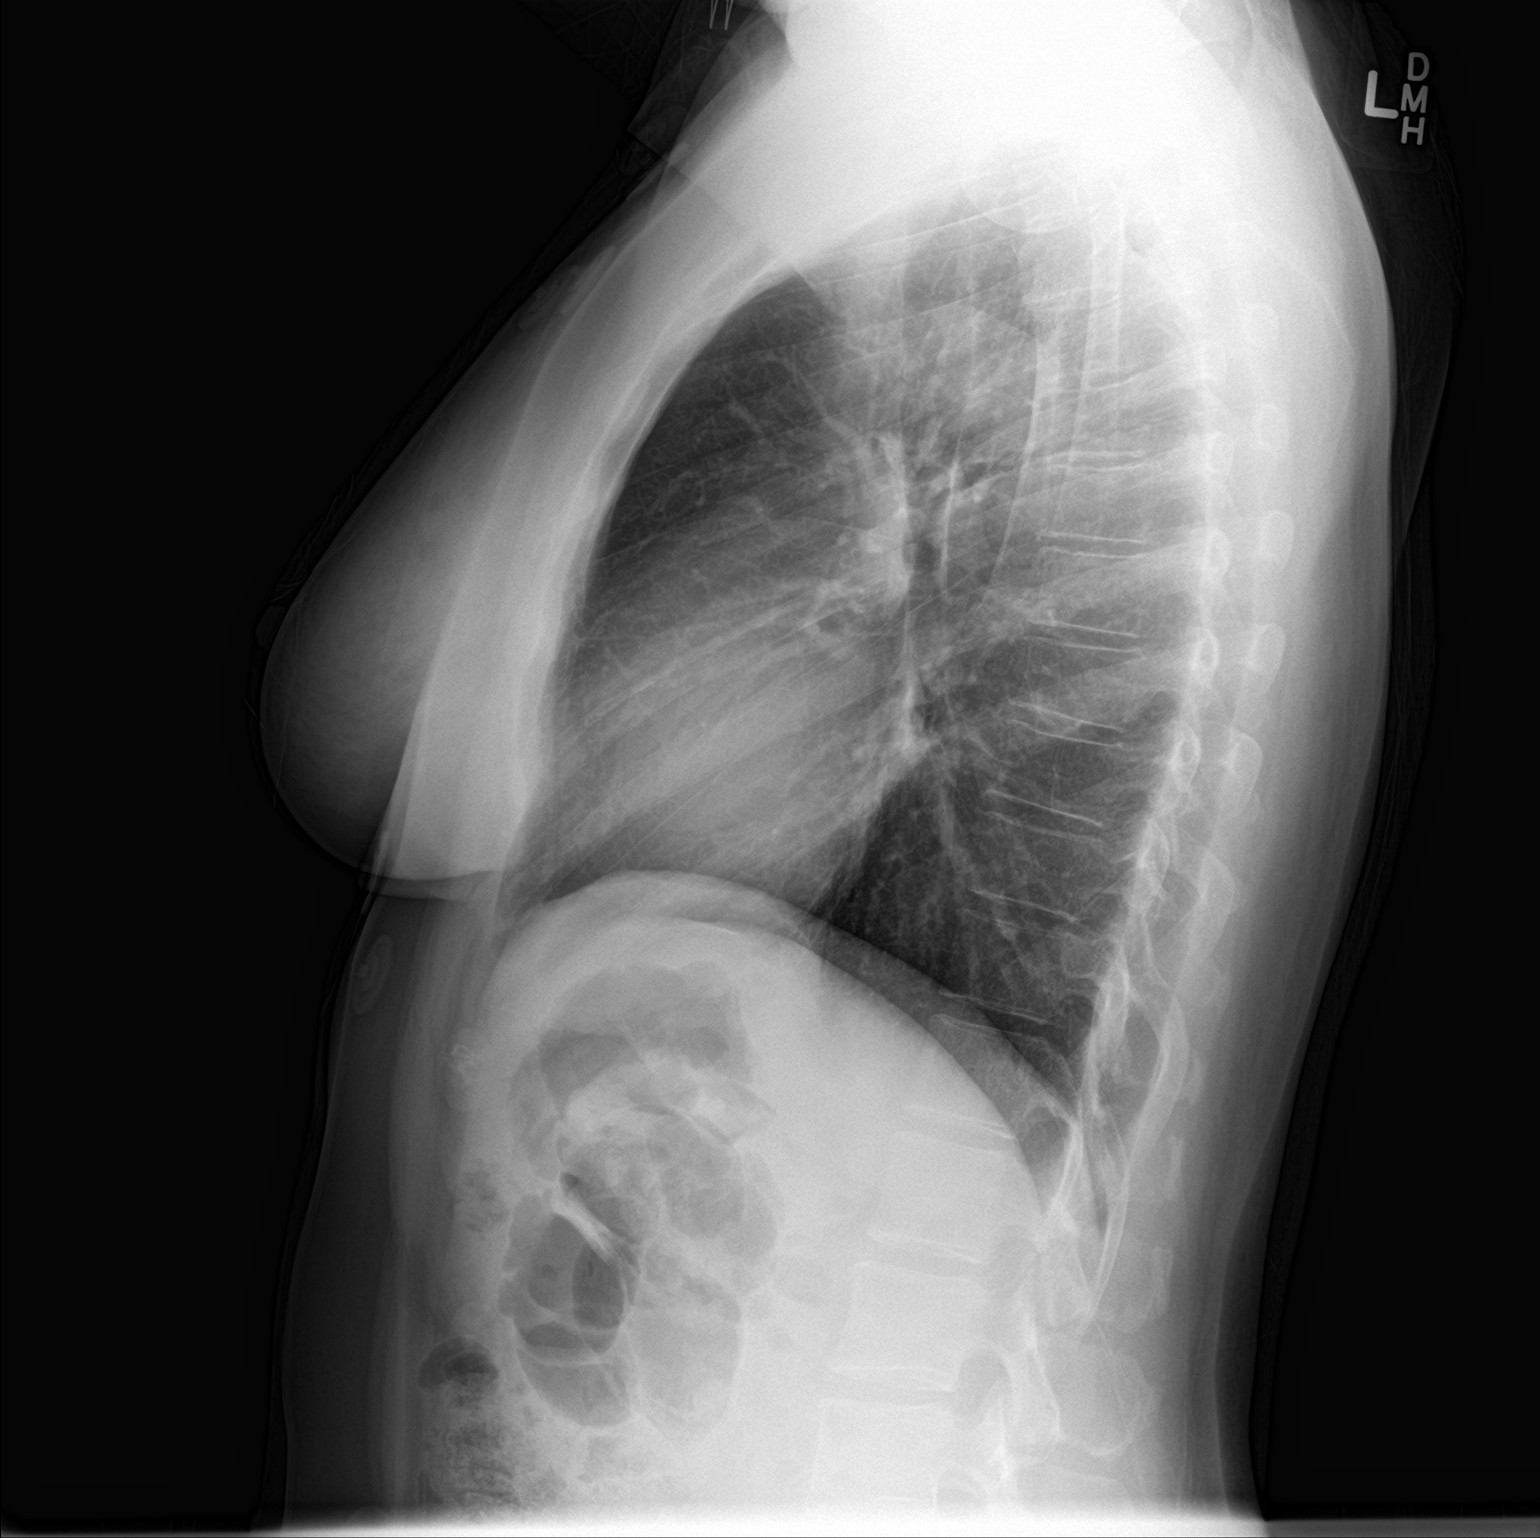

[2 of 2 positions shown; findings below may reference images not displayed]

FINDINGS: Lungs are adequately inflated and otherwise clear. Cardiomediastinal
silhouette and remainder of the exam is unchanged.
IMPRESSION: No active cardiopulmonary disease.

## 2020-01-30 ENCOUNTER — Encounter: Payer: Self-pay | Admitting: Family Medicine

## 2020-01-30 ENCOUNTER — Other Ambulatory Visit: Payer: Self-pay

## 2020-01-30 ENCOUNTER — Ambulatory Visit: Payer: BC Managed Care – PPO | Attending: Family Medicine | Admitting: Family Medicine

## 2020-01-30 VITALS — BP 115/80 | HR 67 | Temp 97.3°F | Wt 157.8 lb

## 2020-01-30 DIAGNOSIS — R202 Paresthesia of skin: Secondary | ICD-10-CM

## 2020-01-30 DIAGNOSIS — E559 Vitamin D deficiency, unspecified: Secondary | ICD-10-CM | POA: Diagnosis not present

## 2020-01-30 DIAGNOSIS — E876 Hypokalemia: Secondary | ICD-10-CM

## 2020-01-30 DIAGNOSIS — I10 Essential (primary) hypertension: Secondary | ICD-10-CM | POA: Diagnosis not present

## 2020-01-30 DIAGNOSIS — R200 Anesthesia of skin: Secondary | ICD-10-CM

## 2020-01-30 NOTE — Patient Instructions (Signed)
Paresthesia Paresthesia is a burning or prickling feeling. This feeling can happen in any part of the body. It often happens in the hands, arms, legs, or feet. Usually, it is not painful. In most cases, the feeling goes away in a short time and is not a sign of a serious problem. If you have paresthesia that lasts a long time, you may need to be seen by your doctor. Follow these instructions at home: Alcohol use   Do not drink alcohol if: ? Your doctor tells you not to drink. ? You are pregnant, may be pregnant, or are planning to become pregnant.  If you drink alcohol: ? Limit how much you use to:  0-1 drink a day for women.  0-2 drinks a day for men. ? Be aware of how much alcohol is in your drink. In the [place] one drink equals one 12 oz bottle of beer (355 mL), one 5 oz glass of wine (148 mL), or one 1 oz glass of hard liquor (44 mL). Nutrition   Eat a healthy diet. This includes: ? Eating foods that have a lot of fiber in them, such as fresh fruits and vegetables, whole grains, and beans. ? Limiting foods that have a lot of fat and processed sugars in them, such as fried or sweet foods. General instructions  Take over-the-counter and prescription medicines only as told by your doctor.  Do not use any products that have nicotine or tobacco in them, such as cigarettes and e-cigarettes. If you need help quitting, ask your doctor.  If you have diabetes, work with your doctor to make sure your blood sugar stays in a healthy range.  If your feet feel numb: ? Check for redness, warmth, and swelling every day. ? Wear padded socks and comfortable shoes. These help protect your feet.  Keep all follow-up visits as told by your doctor. This is important. Contact a doctor if:  You have paresthesia that gets worse or does not go away.  Your burning or prickling feeling gets worse when you walk.  You have pain or cramps.  You feel dizzy.  You have a rash. Get help right away if  you:  Feel weak.  Have trouble walking or moving.  Have problems speaking, understanding, or seeing.  Feel confused.  Cannot control when you pee (urinate) or poop (have a bowel movement).  Lose feeling (have numbness) after an injury.  Have new weakness in an arm or leg.  Pass out (faint). Summary  Paresthesia is a burning or prickling feeling. It often happens in the hands, arms, legs, or feet.  In most cases, the feeling goes away in a short time and is not a sign of a serious problem.  If you have paresthesia that lasts a long time, you may need to be seen by your doctor. This information is not intended to replace advice given to you by your health care provider. Make sure you discuss any questions you have with your health care provider. Document Revised: 04/24/2018 Document Reviewed: 04/07/2017 Elsevier Patient Education  2020 [employer].

## 2020-01-30 NOTE — Progress Notes (Signed)
Numbness in right big toe

## 2020-01-30 NOTE — Progress Notes (Signed)
Established Patient Office Visit  Subjective:  Patient ID: Erica [name], female    DOB: 20-Nov-1965  Age: 54 y.o. MRN: [id number]  CC:  Chief Complaint  Patient presents with  . Hypertension    HPI Erica [name], 54 year old female, who is seen in follow-up of chronic medical issues including hypertension and vitamin D deficiency and she has new concern at today's visit of noticing numbness and tingling in her right great toe a few weeks ago.  She does recall wearing a pair of shoes that were tighter and did not allow as much toe room when she first noticed the sensation of numbness and tingling in her right great toe.  This sensation did go away after a few days but she wonders if there may have been any other cause for the numbness and tingling.  She reports that her blood pressure is currently very well controlled and she has no issues with her current medications.  She denies any current issues with headaches or dizziness.  She reports no issues with cough related to lisinopril use.  She does have a history of low potassium and denies any issues with muscle cramping.  Overall she feels well.  She did complete prior vitamin D therapy for vitamin D deficiency and wonders if her vitamin D deficiency has resolved.  Past Medical History:  Diagnosis Date  . Anemia   . Depression   . Essential hypertension, benign   . Fibroids   . GERD (gastroesophageal reflux disease)   . HH (hiatus hernia)   . Hx of adenomatous colonic polyps   . IBS (irritable bowel syndrome)   . Osteopenia 10/2016  . SUI (stress urinary incontinence, female) 09/29/2016    Past Surgical History:  Procedure Laterality Date  . COLONOSCOPY  01/01/2005   Normal   . POLYPECTOMY    . UTERINE FIBROID EMBOLIZATION      Family History  Problem Relation Age of Onset  . Hodgkin's lymphoma Mother   . Alzheimer's disease Father   . Colon cancer Maternal Grandfather   . Stroke Paternal Grandfather   .  Breast cancer Sister   . Breast cancer Maternal Aunt   . Esophageal cancer Neg Hx   . Stomach cancer Neg Hx   . Rectal cancer Neg Hx     Social History   Socioeconomic History  . Marital status: Single    Spouse name: Not on file  . Number of children: 0  . Years of education: Not on file  . Highest education level: Not on file  Occupational History  . Occupation: MI service specialist    Employer: [employer]  Tobacco Use  . Smoking status: Never Smoker  . Smokeless tobacco: Never Used  Vaping Use  . Vaping Use: Never used  Substance and Sexual Activity  . Alcohol use: No    Alcohol/week: 0.0 standard drinks    Comment: occ  . Drug use: No  . Sexual activity: Not Currently    Birth control/protection: Abstinence, Post-menopausal  Other Topics Concern  . Not on file  Social History Narrative  . Not on file   Social Determinants of Health   Financial Resource Strain:   . Difficulty of Paying Living Expenses: Not on file  Food Insecurity:   . Worried About Charity fundraiser in the Last Year: Not on file  . Ran Out of Food in the Last Year: Not on file  Transportation Needs:   . Lack of Transportation (Medical):  Not on file  . Lack of Transportation (Non-Medical): Not on file  Physical Activity:   . Days of Exercise per Week: Not on file  . Minutes of Exercise per Session: Not on file  Stress:   . Feeling of Stress : Not on file  Social Connections:   . Frequency of Communication with Friends and Family: Not on file  . Frequency of Social Gatherings with Friends and Family: Not on file  . Attends Religious Services: Not on file  . Active Member of Clubs or Organizations: Not on file  . Attends Archivist Meetings: Not on file  . Marital Status: Not on file  Intimate Partner Violence:   . Fear of Current or Ex-Partner: Not on file  . Emotionally Abused: Not on file  . Physically Abused: Not on file  . Sexually Abused: Not on file     Outpatient Medications Prior to Visit  Medication Sig Dispense Refill  . cloNIDine (CATAPRES) 0.1 MG tablet TAKE 1 TABLET (0.1 MG TOTAL) BY MOUTH AT BEDTIME. 90 tablet 0  . famotidine (PEPCID) 20 MG tablet Take 1 tablet (20 mg total) by mouth 2 (two) times daily. To reduce stomach acid 60 tablet 11  . lisinopril-hydrochlorothiazide (ZESTORETIC) 20-25 MG tablet Take 1 tablet by mouth daily. 30 tablet 3  . Multiple Vitamins-Minerals (CENTRUM SILVER 50+WOMEN PO) Take by mouth.    . ergocalciferol (DRISDOL) 1.25 MG (50000 UT) capsule Take 1 capsule (50,000 Units total) by mouth once a week. (Patient not taking: Reported on 12/31/2019) 12 capsule 0  . levocetirizine (XYZAL) 5 MG tablet Take 1 tablet (5 mg total) by mouth every evening. For nasal congestion (Patient not taking: Reported on 10/22/2019) 30 tablet 11  . linaCLOtide (LINZESS PO) Take by mouth. (Patient not taking: Reported on 10/22/2019)     No facility-administered medications prior to visit.    No Known Allergies  ROS Review of Systems  Constitutional: Negative for chills and fever.  HENT: Negative for sore throat and trouble swallowing.   Eyes: Negative for photophobia and visual disturbance.  Respiratory: Negative for cough and shortness of breath.   Cardiovascular: Negative for chest pain and palpitations.  Gastrointestinal: Negative for abdominal pain, constipation, diarrhea and nausea.  Endocrine: Negative for polydipsia, polyphagia and polyuria.  Genitourinary: Negative for dysuria and frequency.  Musculoskeletal: Negative for arthralgias and gait problem.  Skin: Negative for rash and wound.  Neurological: Positive for numbness. Negative for dizziness and headaches.  Hematological: Negative for adenopathy. Does not bruise/bleed easily.      Objective:    Physical Exam Vitals and nursing note reviewed.  Constitutional:      General: She is not in acute distress.    Appearance: Normal appearance.   Cardiovascular:     Rate and Rhythm: Normal rate and regular rhythm.  Pulmonary:     Effort: Pulmonary effort is normal.     Breath sounds: Normal breath sounds.  Abdominal:     Palpations: Abdomen is soft.     Tenderness: There is no abdominal tenderness. There is no right CVA tenderness, left CVA tenderness, guarding or rebound.  Musculoskeletal:        General: No tenderness.     Cervical back: Normal range of motion and neck supple.     Right lower leg: No edema.     Left lower leg: No edema.  Lymphadenopathy:     Cervical: No cervical adenopathy.  Skin:    General: Skin is warm and dry.  Capillary Refill: Capillary refill takes less than 2 seconds. Right great toe with normal capillary refill Neurological:     General: No focal deficit present.     Mental Status: She is alert and oriented to person, place, and time.     Comments: Normal monofilament exam of the right great toe/right foot  Psychiatric:        Mood and Affect: Mood normal.        Behavior: Behavior normal.     BP 115/80 (BP Location: Left Arm, Patient Position: Sitting)   Pulse 67   Temp (!) 97.3 F (36.3 C)   Wt 157 lb 12.8 oz (71.6 kg)   LMP  (LMP Unknown)   SpO2 100%   BMI 24.35 kg/m  Wt Readings from Last 3 Encounters:  01/30/20 157 lb 12.8 oz (71.6 kg)  10/22/19 159 lb 9.6 oz (72.4 kg)  09/27/19 157 lb (71.2 kg)     Health Maintenance Due  Topic Date Due  . INFLUENZA VACCINE  11/11/2019      Lab Results  Component Value Date   TSH 1.070 01/30/2020   Lab Results  Component Value Date   WBC 4.9 09/27/2019   HGB 13.9 09/27/2019   HCT 41.3 09/27/2019   MCV 89 09/27/2019   PLT 181 09/27/2019   Lab Results  Component Value Date   NA 138 01/30/2020   K 4.3 01/30/2020   CHLORIDE 106 04/30/2013   CO2 27 01/30/2020   GLUCOSE 86 01/30/2020   BUN 18 01/30/2020   CREATININE 0.78 01/30/2020   BILITOT 0.4 08/10/2018   ALKPHOS 62 08/10/2018   AST 24 08/10/2018   ALT 23  08/10/2018   PROT 7.5 08/10/2018   ALBUMIN 4.2 08/10/2018   CALCIUM 9.9 01/30/2020   ANIONGAP 13 08/10/2018   GFR 74.46 01/27/2018   Lab Results  Component Value Date   CHOL 178 09/27/2019   Lab Results  Component Value Date   HDL 63 09/27/2019   Lab Results  Component Value Date   LDLCALC 104 (H) 09/27/2019   Lab Results  Component Value Date   TRIG 59 09/27/2019   Lab Results  Component Value Date   CHOLHDL 2.8 09/27/2019   Lab Results  Component Value Date   HGBA1C 5.3 06/15/2019      Assessment & Plan:  1. Essential hypertension Blood pressure is very good at today's visit.  She will continue her current lisinopril-hydrochlorothiazide, 20-25 mg daily as well as clonidine 0.1 mg at bedtime as her blood pressure is currently stable and controlled..  2. Numbness and tingling of foot Will obtain basic metabolic panel to look for electrolyte abnormality or elevated glucose, T4 and TSH to look for thyroid disorder and vitamin B12 level to look for vitamin B12 deficiency in follow-up of patient's complaint of numbness and tingling in her right great toe.  This did however occur in the setting of patient wearing shoes that were tight which may have caused the sensation of numbness and tingling.  She reports a sensation improved after wearing different shoes.  Educational material on paresthesias given as part of after visit summary. - Basic Metabolic Panel - T4 AND TSH - Vitamin B12  3. Hypokalemia We will check potassium level as part of basic metabolic panel as patient with history of hypokalemia - Basic Metabolic Panel  4. Vitamin D deficiency We will check vitamin D level in follow-up of vitamin D deficiency and will notify patient of recommendations based on lab results. -  Vitamin D, 25-hydroxy     Follow-up: Return in about 4 months (around 06/01/2020) for f/u blood pressure; call sooner if continued toe numbness.    Antony Blackbird, MD

## 2020-01-31 LAB — BASIC METABOLIC PANEL WITH GFR
BUN/Creatinine Ratio: 23 (ref 9–23)
BUN: 18 mg/dL (ref 6–24)
CO2: 27 mmol/L (ref 20–29)
Calcium: 9.9 mg/dL (ref 8.7–10.2)
Chloride: 100 mmol/L (ref 96–106)
Creatinine, Ser: 0.78 mg/dL (ref 0.57–1.00)
GFR calc Af Amer: 99 mL/min/1.73
GFR calc non Af Amer: 86 mL/min/1.73
Glucose: 86 mg/dL (ref 65–99)
Potassium: 4.3 mmol/L (ref 3.5–5.2)
Sodium: 138 mmol/L (ref 134–144)

## 2020-01-31 LAB — VITAMIN D 25 HYDROXY (VIT D DEFICIENCY, FRACTURES): Vit D, 25-Hydroxy: 35.1 ng/mL (ref 30.0–100.0)

## 2020-01-31 LAB — T4 AND TSH
T4, Total: 7.9 ug/dL (ref 4.5–12.0)
TSH: 1.07 u[IU]/mL (ref 0.450–4.500)

## 2020-01-31 LAB — VITAMIN B12: Vitamin B-12: 1059 pg/mL (ref 232–1245)

## 2020-02-01 ENCOUNTER — Telehealth: Payer: Self-pay | Admitting: Family Medicine

## 2020-02-01 NOTE — Telephone Encounter (Signed)
Copied from [place] [id number]. Topic: General - Other >> Feb 01, 2020  9:45 AM Yvette Rack wrote: Reason for CRM: Pt called for an update on her most recent lab results. Pt stated she saw her results but she is not sure what it means. Pt requests call back to discuss lab results. Pt asked that a detailed message is left if she is unable to answer. Pt also request referral to neurologist.

## 2020-02-14 DIAGNOSIS — F331 Major depressive disorder, recurrent, moderate: Secondary | ICD-10-CM | POA: Diagnosis not present

## 2020-02-19 DIAGNOSIS — F331 Major depressive disorder, recurrent, moderate: Secondary | ICD-10-CM | POA: Diagnosis not present

## 2020-03-04 DIAGNOSIS — F331 Major depressive disorder, recurrent, moderate: Secondary | ICD-10-CM | POA: Diagnosis not present

## 2020-03-07 ENCOUNTER — Ambulatory Visit: Payer: Self-pay

## 2020-03-07 NOTE — Telephone Encounter (Addendum)
Called left VM to return cal to office to discuss foot pain.  2nd attempt to contact patient about foot pain Left VM for her to call office.

## 2020-03-07 NOTE — Telephone Encounter (Signed)
Patient returned call and says she's been having pain to the bottom of both feet, mainly the right foot. She says she noticed it last night and also had some joint pain. She says the pain is a 2-3 intensity. She says she talked about numbness to her toes at her last visit, but at that time no pain in her feet. I advised Dr. Chapman Fitch is no longer at the practice. She asked if there is an opening on next Friday afternoon. I advised no availability with any provider next week, advised someone will call from the office with options for an appointment, since no availability until January. Care advice given, patient verbalized understanding.  Reason for Disposition . [1] MILD pain (e.g., does not interfere with normal activities) AND [2] present > 7 days  Answer Assessment - Initial Assessment Questions 1. ONSET: "When did the pain start?"      Last night 2. LOCATION: "Where is the pain located?"      Bottom of both feet, but mainly the right foot 3. PAIN: "How bad is the pain?"    (Scale 1-10; or mild, moderate, severe)  - MILD (1-3): doesn't interfere with normal activities.   - MODERATE (4-7): interferes with normal activities (e.g., work or school) or awakens from sleep, limping.   - SEVERE (8-10): excruciating pain, unable to do any normal activities, unable to walk.      2-3 4. WORK OR EXERCISE: "Has there been any recent work or exercise that involved this part of the body?"       No 5. CAUSE: "What do you think is causing the foot pain?"      Not real sure 6. OTHER SYMPTOMS: "Do you have any other symptoms?" (e.g., leg pain, rash, fever, numbness)     Joint pain 7. PREGNANCY: "Is there any chance you are pregnant?" "When was your last menstrual period?"     No  Protocols used: FOOT PAIN-A-AH

## 2020-03-10 ENCOUNTER — Ambulatory Visit: Payer: Self-pay | Admitting: *Deleted

## 2020-03-10 DIAGNOSIS — G5601 Carpal tunnel syndrome, right upper limb: Secondary | ICD-10-CM | POA: Diagnosis not present

## 2020-03-10 DIAGNOSIS — Z8719 Personal history of other diseases of the digestive system: Secondary | ICD-10-CM | POA: Diagnosis not present

## 2020-03-10 NOTE — Telephone Encounter (Signed)
C/o progressive pain in bottom of bilateral feet now in joints in legs, knees, hands and arms. Pain occurs mostly at night. Reports "pinches" on face and body and has progressively gotten worse over the past 2 weeks. Previously seen by PCP Dr. Chapman Fitch for pain starting in "big" toe. Now pain is worse.  Reports after eating a meal she gets "bloated" and then joints begin to ache and hurt. Noted frequent urination. Denies fever, SOB, chest pain, rash. Patient would like to continue to see Dr. Chapman Fitch if she can find where she is practicing now. No available appt with another provider until 04/30/20. Care advise given. Patient verbalized understanding of care advise and to call back or go to [facility] or ED if symptoms worsen. Patient reports she is going to UC do to increasing pain. Please advise if earlier visit available.   Reason for Disposition . [1] MODERATE pain (e.g., interferes with normal activities) AND [2] present > 3 days    Pain occurs mostly at night and can tolerate normal activities during the day.  Answer Assessment - Initial Assessment Questions 1. ONSET: "When did the muscle aches or body pains start?"      Greater than 2 weeks ago  2. LOCATION: "What part of your body is hurting?" (e.g., entire body, arms, legs)      Bottom of bilateral feet, legs, knees, hands, arms. 3. SEVERITY: "How bad is the pain?" (Scale 1-10; or mild, moderate, severe)   - MILD (1-3): doesn't interfere with normal activities    - MODERATE (4-7): interferes with normal activities or awakens from sleep    - SEVERE (8-10):  excruciating pain, unable to do any normal activities      Moderate  4. CAUSE: "What do you think is causing the pains?"     unknown 5. FEVER: "Have you been having fever?"     no 6. OTHER SYMPTOMS: "Do you have any other symptoms?" (e.g., chest pain, weakness, rash, cold or flu symptoms, weight loss)     No. Feels "bloated" after eating  And body aches at night . 7. PREGNANCY: "Is there any chance  you are pregnant?" "When was your last menstrual period?"     Na  8. TRAVEL: "Have you traveled out of the country in the last month?" (e.g., travel history, exposures)     na  Protocols used: MUSCLE ACHES AND BODY PAIN-A-AH

## 2020-03-10 NOTE — Telephone Encounter (Signed)
Contacted pt to schedule an appt pt is heading to the urgent care and pt has been scheduled for 12/6 with Dr. Wynetta Emery

## 2020-03-17 ENCOUNTER — Telehealth: Payer: Self-pay | Admitting: General Practice

## 2020-03-17 ENCOUNTER — Ambulatory Visit: Payer: BC Managed Care – PPO | Admitting: Internal Medicine

## 2020-03-17 NOTE — Telephone Encounter (Signed)
Patient called in reference to new patient appointment , per patient her Dr Marzetta Board spoke with you and stated you would make exception to see patient.  Please Advise if ok to schedule as a New Patient.

## 2020-03-17 NOTE — Telephone Encounter (Signed)
Yes, will see her

## 2020-03-23 ENCOUNTER — Other Ambulatory Visit: Payer: Self-pay | Admitting: Family Medicine

## 2020-03-23 DIAGNOSIS — N951 Menopausal and female climacteric states: Secondary | ICD-10-CM

## 2020-03-23 NOTE — Telephone Encounter (Signed)
Requested Prescriptions  Pending Prescriptions Disp Refills  . cloNIDine (CATAPRES) 0.1 MG tablet [Pharmacy Med Name: CLONIDINE HCL 0.1 MG TABLET] 90 tablet 0    Sig: TAKE 1 TABLET (0.1 MG TOTAL) BY MOUTH AT BEDTIME.     Cardiovascular:  Alpha-2 Agonists Passed - 03/23/2020  2:32 PM      Passed - Last BP in normal range    BP Readings from Last 1 Encounters:  01/30/20 115/80         Passed - Last Heart Rate in normal range    Pulse Readings from Last 1 Encounters:  01/30/20 67         Passed - Valid encounter within last 6 months    Recent Outpatient Visits          1 month ago Essential hypertension   [place], MD   2 months ago Essential hypertension   [place], Charlane Ferretti, MD   5 months ago Vaginal irritation   [place], [place], MD   9 months ago Chronic constipation   [facility] Fulp, [place], MD   1 year ago Essential hypertension   [facility] [place], [place], MD

## 2020-03-24 DIAGNOSIS — F331 Major depressive disorder, recurrent, moderate: Secondary | ICD-10-CM | POA: Diagnosis not present

## 2020-03-24 NOTE — Telephone Encounter (Signed)
Called patient to schedule appointment, no answer left voice mail

## 2020-03-25 ENCOUNTER — Other Ambulatory Visit: Payer: Self-pay | Admitting: Family Medicine

## 2020-03-25 DIAGNOSIS — I10 Essential (primary) hypertension: Secondary | ICD-10-CM

## 2020-04-03 ENCOUNTER — Other Ambulatory Visit (HOSPITAL_COMMUNITY)
Admission: RE | Admit: 2020-04-03 | Discharge: 2020-04-03 | Disposition: A | Discharge status: Home / Self Care | Payer: BC Managed Care – PPO | Source: Ambulatory Visit | Attending: Obstetrics | Admitting: Obstetrics

## 2020-04-03 ENCOUNTER — Other Ambulatory Visit: Payer: Self-pay

## 2020-04-03 ENCOUNTER — Ambulatory Visit: Payer: BC Managed Care – PPO | Admitting: Obstetrics

## 2020-04-03 ENCOUNTER — Encounter: Payer: Self-pay | Admitting: Obstetrics

## 2020-04-03 VITALS — BP 108/70 | HR 83 | Ht 67.5 in | Wt 162.0 lb

## 2020-04-03 DIAGNOSIS — D261 Other benign neoplasm of corpus uteri: Secondary | ICD-10-CM | POA: Diagnosis not present

## 2020-04-03 DIAGNOSIS — N950 Postmenopausal bleeding: Secondary | ICD-10-CM

## 2020-04-03 DIAGNOSIS — N858 Other specified noninflammatory disorders of uterus: Secondary | ICD-10-CM | POA: Diagnosis not present

## 2020-04-03 DIAGNOSIS — N898 Other specified noninflammatory disorders of vagina: Secondary | ICD-10-CM | POA: Insufficient documentation

## 2020-04-03 NOTE — Progress Notes (Signed)
Patient ID: Erica [name], female   DOB: 1965-09-16, 54 y.o.   MRN: [id number]  Chief Complaint  Patient presents with  . Postmenopausal Spotting    HPI Erica [name] is a 54 y.o. female.  Brownish vaginal discharge for the last several months.  Has intermittent pelvic pain. HPI  Past Medical History:  Diagnosis Date  . Anemia   . Depression   . Essential hypertension, benign   . Fibroids   . GERD (gastroesophageal reflux disease)   . HH (hiatus hernia)   . Hx of adenomatous colonic polyps   . IBS (irritable bowel syndrome)   . Osteopenia 10/2016  . SUI (stress urinary incontinence, female) 09/29/2016    Past Surgical History:  Procedure Laterality Date  . COLONOSCOPY  01/01/2005   Normal   . POLYPECTOMY    . UTERINE FIBROID EMBOLIZATION      Family History  Problem Relation Age of Onset  . Hodgkin's lymphoma Mother   . Alzheimer's disease Father   . Colon cancer Maternal Grandfather   . Stroke Paternal Grandfather   . Breast cancer Sister   . Breast cancer Maternal Aunt   . Esophageal cancer Neg Hx   . Stomach cancer Neg Hx   . Rectal cancer Neg Hx     Social History Social History   Tobacco Use  . Smoking status: Never Smoker  . Smokeless tobacco: Never Used  Vaping Use  . Vaping Use: Never used  Substance Use Topics  . Alcohol use: No    Alcohol/week: 0.0 standard drinks    Comment: occ  . Drug use: No    No Known Allergies  Current Outpatient Medications  Medication Sig Dispense Refill  . cloNIDine (CATAPRES) 0.1 MG tablet TAKE 1 TABLET (0.1 MG TOTAL) BY MOUTH AT BEDTIME. 90 tablet 0  . ergocalciferol (DRISDOL) 1.25 MG (50000 UT) capsule Take 1 capsule (50,000 Units total) by mouth once a week. (Patient not taking: Reported on 12/31/2019) 12 capsule 0  . famotidine (PEPCID) 20 MG tablet Take 1 tablet (20 mg total) by mouth 2 (two) times daily. To reduce stomach acid 60 tablet 11  . levocetirizine (XYZAL) 5 MG tablet Take 1 tablet (5  mg total) by mouth every evening. For nasal congestion (Patient not taking: Reported on 10/22/2019) 30 tablet 11  . linaCLOtide (LINZESS PO) Take by mouth. (Patient not taking: Reported on 10/22/2019)    . lisinopril-hydrochlorothiazide (ZESTORETIC) 20-25 MG tablet Take 1 tablet by mouth daily. 30 tablet 3  . Multiple Vitamins-Minerals (CENTRUM SILVER 50+WOMEN PO) Take by mouth.     No current facility-administered medications for this visit.    Review of Systems Review of Systems Constitutional: negative for fatigue and weight loss Respiratory: negative for cough and wheezing Cardiovascular: negative for chest pain, fatigue and palpitations Gastrointestinal: negative for abdominal pain and change in bowel habits Genitourinary: positive for brownish vaginal spotting and discharge Integument/breast: negative for nipple discharge Musculoskeletal:negative for myalgias Neurological: negative for gait problems and tremors Behavioral/Psych: negative for abusive relationship, depression Endocrine: negative for temperature intolerance      Blood pressure 108/70, pulse 83, height 5' 7.5" (1.715 m), weight 162 lb (73.5 kg).  Physical Exam Physical Exam           General: Alert and no distress Abdomen:  normal findings: no organomegaly, soft, non-tender and no hernia  Pelvis:  External genitalia: normal general appearance Urinary system: urethral meatus normal and bladder without fullness, nontender Vaginal: normal without tenderness, induration  or masses Cervix: normal appearance Adnexa: normal bimanual exam Uterus: anteverted and non-tender, normal size    50% of 20 min visit spent on counseling and coordination of care.   Data Reviewed Labs Previous ultrasound  Assessment     1. Postmenopausal vaginal bleeding Rx: - Surgical pathology( [place]/ [facility])  2. Vaginal discharge Rx: - Cervicovaginal ancillary only    Plan   Endometrial Biopsy  Follow up in 2 weeks    Shelly Bombard, MD 04/03/2020 1:23 PM    Endometrial Biopsy Procedure Note  Pre-operative Diagnosis: Postmenopausal Vaginal Bleeding  Post-operative Diagnosis: same  Indications: postmenopausal bleeding  Procedure Details   Urine pregnancy test was not done.  The risks (including infection, bleeding, pain, and uterine perforation) and benefits of the procedure were explained to the patient and Written informed consent was obtained.    The patient was placed in the dorsal lithotomy position.  Bimanual exam showed the uterus to be in the neutral position.  A Graves' speculum inserted in the vagina, and the cervix prepped with povidone iodine.  Endocervical curettage with a Kevorkian curette was not performed.   A sharp tenaculum was applied to the anterior lip of the cervix for stabilization.  A sterile uterine sound was used to sound the uterus to a depth of 9cm.  A Pipelle endometrial aspirator was used to sample the endometrium.  Sample was sent for pathologic examination.  Condition: Stable  Complications: None  Plan: Follow up in 2 weeks  The patient was advised to call for any fever or for prolonged or severe pain or bleeding. She was advised to use OTC ibuprofen as needed for mild to moderate pain. She was advised to avoid vaginal intercourse for 48 hours or until the bleeding has completely stopped.  Attending Physician Documentation: I was present for or participated in the entire procedure, including opening and closing.   Shelly Bombard, MD 04/03/2020 1:28 PM

## 2020-04-03 NOTE — Progress Notes (Signed)
GYN presents for Postmenopausal vaginal spotting x 2 months, intermittent pelvic pain 5-6/10.  Last PAP 10/22/2019 Next Mammogram 11/05/2021

## 2020-04-07 ENCOUNTER — Telehealth: Payer: Self-pay

## 2020-04-07 LAB — SURGICAL PATHOLOGY

## 2020-04-07 LAB — CERVICOVAGINAL ANCILLARY ONLY
Bacterial Vaginitis (gardnerella): NEGATIVE
Candida Glabrata: NEGATIVE
Candida Vaginitis: NEGATIVE
Comment: NEGATIVE
Comment: NEGATIVE
Comment: NEGATIVE

## 2020-04-07 NOTE — Telephone Encounter (Signed)
Patient called to advise [place] she noticed some blood this am after wiping and she is concerned. I advised patient it is not abnormal to have some bleeding after having a biopsy performed. She should monitored her bleeding for the next couple of days. If bleeding gets worse please call the office back.Patient voice understanding.

## 2020-04-08 DIAGNOSIS — F331 Major depressive disorder, recurrent, moderate: Secondary | ICD-10-CM | POA: Diagnosis not present

## 2020-04-10 ENCOUNTER — Other Ambulatory Visit: Payer: Self-pay

## 2020-04-12 NOTE — Progress Notes (Deleted)
[facility] at [facility] [address], [address] [place], [place] [address] [phone]  Date:  04/14/2020   Name:  Erica [name]   DOB:  1965/04/23   MRN:  [id number]  PCP:  No primary care provider on file.    Chief Complaint: No chief complaint on file.   History of Present Illness:  Erica [name] is a 55 y.o. very pleasant female patient who presents with the following:  Patient is here today as a new patient visit-referred to this office by Dr. Conley Rolls  History of hypertension, osteopenia, IBS  Flu vaccine COVID-19 series and booster complete She does have a gynecologist, Dr. Reather Littler recently with PMB Patient Active Problem List   Diagnosis Date Noted  . Chest pain of uncertain etiology 10/03/2018  . Liver hemangioma, giant, incidental finding 09/06/2018  . Dyspepsia 07/10/2018  . Osteopenia 11/26/2016  . Vitamin D deficiency 11/26/2016  . Essential hypertension 08/04/2006  . IBS 08/04/2006    Past Medical History:  Diagnosis Date  . Anemia   . Depression   . Essential hypertension, benign   . Fibroids   . GERD (gastroesophageal reflux disease)   . HH (hiatus hernia)   . Hx of adenomatous colonic polyps   . IBS (irritable bowel syndrome)   . Osteopenia 10/2016  . SUI (stress urinary incontinence, female) 09/29/2016    Past Surgical History:  Procedure Laterality Date  . COLONOSCOPY  01/01/2005   Normal   . POLYPECTOMY    . UTERINE FIBROID EMBOLIZATION      Social History   Tobacco Use  . Smoking status: Never Smoker  . Smokeless tobacco: Never Used  Vaping Use  . Vaping Use: Never used  Substance Use Topics  . Alcohol use: No    Alcohol/week: 0.0 standard drinks    Comment: occ  . Drug use: No    Family History  Problem Relation Age of Onset  . Hodgkin's lymphoma Mother   . Alzheimer's disease Father   . Colon cancer Maternal Grandfather   . Stroke Paternal Grandfather   . Breast  cancer Sister   . Breast cancer Maternal Aunt   . Esophageal cancer Neg Hx   . Stomach cancer Neg Hx   . Rectal cancer Neg Hx     No Known Allergies  Medication list has been reviewed and updated.  Current Outpatient Medications on File Prior to Visit  Medication Sig Dispense Refill  . cloNIDine (CATAPRES) 0.1 MG tablet TAKE 1 TABLET (0.1 MG TOTAL) BY MOUTH AT BEDTIME. 90 tablet 0  . ergocalciferol (DRISDOL) 1.25 MG (50000 UT) capsule Take 1 capsule (50,000 Units total) by mouth once a week. (Patient not taking: Reported on 12/31/2019) 12 capsule 0  . famotidine (PEPCID) 20 MG tablet Take 1 tablet (20 mg total) by mouth 2 (two) times daily. To reduce stomach acid 60 tablet 11  . levocetirizine (XYZAL) 5 MG tablet Take 1 tablet (5 mg total) by mouth every evening. For nasal congestion (Patient not taking: Reported on 10/22/2019) 30 tablet 11  . linaCLOtide (LINZESS PO) Take by mouth. (Patient not taking: Reported on 10/22/2019)    . lisinopril-hydrochlorothiazide (ZESTORETIC) 20-25 MG tablet Take 1 tablet by mouth daily. 30 tablet 3  . Multiple Vitamins-Minerals (CENTRUM SILVER 50+WOMEN PO) Take by mouth.     No current facility-administered medications on file prior to visit.    Review of Systems:  As per HPI- otherwise negative.  Physical Examination: There were no vitals filed for this visit. There were no vitals filed for this visit. There is no height or weight on file to calculate BMI. Ideal Body Weight:    GEN: no acute distress. HEENT: Atraumatic, Normocephalic.  Ears and Nose: No external deformity. CV: RRR, No M/G/R. No JVD. No thrill. No extra heart sounds. PULM: CTA B, no wheezes, crackles, rhonchi. No retractions. No resp. distress. No accessory muscle use. ABD: S, NT, ND, +BS. No rebound. No HSM. EXTR: No c/c/e PSYCH: Normally interactive. Conversant.    Assessment and Plan: *** This visit occurred during the SARS-CoV-2 public health emergency.  Safety  protocols were in place, including screening questions prior to the visit, additional usage of staff PPE, and extensive cleaning of exam room while observing appropriate contact time as indicated for disinfecting solutions.    Signed Lamar Blinks, MD

## 2020-04-14 ENCOUNTER — Ambulatory Visit: Payer: BC Managed Care – PPO | Admitting: Family Medicine

## 2020-04-15 NOTE — Telephone Encounter (Signed)
Ok to reschedule

## 2020-04-15 NOTE — Telephone Encounter (Signed)
Patient no-show appointment on 04/14/20 with Dr. Patsy Lager due to not been able to find the location. Patient states she called to notify us by the time she found the location it was too late.   Can we re-scheduled?

## 2020-04-22 DIAGNOSIS — F331 Major depressive disorder, recurrent, moderate: Secondary | ICD-10-CM | POA: Diagnosis not present

## 2020-04-24 ENCOUNTER — Other Ambulatory Visit: Payer: Self-pay | Admitting: Family Medicine

## 2020-04-24 DIAGNOSIS — I10 Essential (primary) hypertension: Secondary | ICD-10-CM

## 2020-04-28 ENCOUNTER — Ambulatory Visit: Payer: BC Managed Care – PPO | Admitting: Family Medicine

## 2020-05-06 DIAGNOSIS — F331 Major depressive disorder, recurrent, moderate: Secondary | ICD-10-CM | POA: Diagnosis not present

## 2020-05-18 ENCOUNTER — Other Ambulatory Visit: Payer: Self-pay | Admitting: Family Medicine

## 2020-05-18 DIAGNOSIS — I10 Essential (primary) hypertension: Secondary | ICD-10-CM

## 2020-05-20 DIAGNOSIS — F331 Major depressive disorder, recurrent, moderate: Secondary | ICD-10-CM | POA: Diagnosis not present

## 2020-05-27 NOTE — Progress Notes (Addendum)
[facility] [place], [place], [place] [address] [phone]  Date:  05/29/2020   Name:  Erica [name]   DOB:  01-05-1966   MRN:  [id number]  PCP:  Darreld Mclean, MD    Chief Complaint: New Patient (Initial Visit) (Was seeing Dr.Fulp at [facility])   History of Present Illness:  Erica [name] is a 55 y.o. very pleasant female patient who presents with the following:  Here today as a new patient to establish care- referred to see [place] per Dr Marin Comment  She is from [place], has lived in [place] for nearly 66 years  She works for [employer]  No children In her free time she enjoys exercise at the gym History of HTN, IBS, GERD, osteopenia  She went to see her foot doctor due to numbness in her toe- she was tested for DM and was negative   Her GYN is DR Joanna Hews is UTD Her GI retired but colonoscopy is UTD  Colonoscopy in 2020  She would like to do labs today   She would like to get the shingrix first dose today- prefers to do flu vaccine at a later date      Patient Active Problem List   Diagnosis Date Noted  . Chest pain of uncertain etiology 41/32/4401  . Liver hemangioma, giant, incidental finding 09/06/2018  . Dyspepsia 07/10/2018  . Osteopenia 11/26/2016  . Vitamin D deficiency 11/26/2016  . Essential hypertension 08/04/2006  . IBS 08/04/2006    Past Medical History:  Diagnosis Date  . Anemia   . Depression   . Essential hypertension, benign   . Fibroids   . GERD (gastroesophageal reflux disease)   . HH (hiatus hernia)   . Hx of adenomatous colonic polyps   . IBS (irritable bowel syndrome)   . Osteopenia 10/2016  . SUI (stress urinary incontinence, female) 09/29/2016    Past Surgical History:  Procedure Laterality Date  . COLONOSCOPY  01/01/2005   Normal   . POLYPECTOMY    . UTERINE FIBROID EMBOLIZATION      Social History   Tobacco Use  . Smoking status:  Never Smoker  . Smokeless tobacco: Never Used  Vaping Use  . Vaping Use: Never used  Substance Use Topics  . Alcohol use: No    Alcohol/week: 0.0 standard drinks    Comment: occ  . Drug use: No    Family History  Problem Relation Age of Onset  . Hodgkin's lymphoma Mother   . Alzheimer's disease Father   . Colon cancer Maternal Grandfather   . Stroke Paternal Grandfather   . Breast cancer Sister   . Breast cancer Maternal Aunt   . Esophageal cancer Neg Hx   . Stomach cancer Neg Hx   . Rectal cancer Neg Hx     No Known Allergies  Medication list has been reviewed and updated.  Current Outpatient Medications on File Prior to Visit  Medication Sig Dispense Refill  . Biotin 10 MG CAPS Take by mouth.    . famotidine (PEPCID) 20 MG tablet Take 1 tablet (20 mg total) by mouth 2 (two) times daily. To reduce stomach acid 60 tablet 11  . levocetirizine (XYZAL) 5 MG tablet Take 1 tablet (5 mg total) by mouth every evening. For nasal congestion 30 tablet 11  . lisinopril-hydrochlorothiazide (ZESTORETIC) 20-25 MG tablet TAKE 1 TABLET BY MOUTH EVERY DAY 90 tablet 0  .  Multiple Vitamins-Minerals (CENTRUM SILVER 50+WOMEN PO) Take by mouth.    . ergocalciferol (DRISDOL) 1.25 MG (50000 UT) capsule Take 1 capsule (50,000 Units total) by mouth once a week. (Patient not taking: No sig reported) 12 capsule 0  . linaCLOtide (LINZESS PO) Take by mouth. (Patient not taking: No sig reported)     No current facility-administered medications on file prior to visit.    Review of Systems:  As per HPI- otherwise negative.   Physical Examination: Vitals:   05/29/20 1508  BP: (!) 134/94  Pulse: 80  Temp: 98 F (36.7 C)  SpO2: 99%   Vitals:   05/29/20 1508  Weight: 165 lb 3.2 oz (74.9 kg)  Height: 5' 7.5" (1.715 m)   Body mass index is 25.49 kg/m. Ideal Body Weight: Weight in (lb) to have BMI = 25: 161.7  GEN: no acute distress.  Normal weight, looks well  HEENT: Atraumatic,  Normocephalic.  Ears and Nose: No external deformity. CV: RRR, No M/G/R. No JVD. No thrill. No extra heart sounds. PULM: CTA B, no wheezes, crackles, rhonchi. No retractions. No resp. distress. No accessory muscle use. ABD: S, NT, ND, +BS. No rebound. No HSM. EXTR: No c/c/e PSYCH: Normally interactive. Conversant.  Pt notes some nail pigmentation change.  This has been evaluated by derm and not thought to be concerning.  Offered reassurance    Assessment and Plan: Essential hypertension - Plan: CBC, Comprehensive metabolic panel  Immunization due - Plan: Varicella-zoster vaccine IM (Shingrix)  Screening, deficiency anemia, iron - Plan: CBC  Screening for diabetes mellitus - Plan: Hemoglobin A1c, TSH  Screening, lipid - Plan: Lipid panel  Establishing care today BP is under reasonable control shingrix #1 given Labs pending as above Asked her to visit with me in 6 months for recheck and 2nd shingles dose  Recommend flu shot  This visit occurred during the SARS-CoV-2 public health emergency.  Safety protocols were in place, including screening questions prior to the visit, additional usage of staff PPE, and extensive cleaning of exam room while observing appropriate contact time as indicated for disinfecting solutions.    Signed Lamar Blinks, MD  I received her labs as below, 2/18.  Message to patient  Results for orders placed or performed in visit on 05/29/20  CBC  Result Value Ref Range   WBC 5.5 4.0 - 10.5 K/uL   RBC 4.42 3.87 - 5.11 Mil/uL   Platelets 142.0 (L) 150.0 - 400.0 K/uL   Hemoglobin 13.3 12.0 - 15.0 g/dL   HCT 39.4 36.0 - 46.0 %   MCV 89.1 78.0 - 100.0 fl   MCHC 33.9 30.0 - 36.0 g/dL   RDW 13.0 11.5 - 15.5 %  Comprehensive metabolic panel  Result Value Ref Range   Sodium 140 135 - 145 mEq/L   Potassium 4.2 3.5 - 5.1 mEq/L   Chloride 102 96 - 112 mEq/L   CO2 32 19 - 32 mEq/L   Glucose, Bld 84 70 - 99 mg/dL   BUN 22 6 - 23 mg/dL   Creatinine, Ser  0.91 0.40 - 1.20 mg/dL   Total Bilirubin 0.4 0.2 - 1.2 mg/dL   Alkaline Phosphatase 73 39 - 117 U/L   AST 18 0 - 37 U/L   ALT 12 0 - 35 U/L   Total Protein 7.4 6.0 - 8.3 g/dL   Albumin 4.4 3.5 - 5.2 g/dL   GFR 70.88 >60.00 mL/min   Calcium 10.1 8.4 - 10.5 mg/dL  Hemoglobin A1c  Result Value Ref Range   Hgb A1c MFr Bld 5.6 4.6 - 6.5 %  Lipid panel  Result Value Ref Range   Cholesterol 187 0 - 200 mg/dL   Triglycerides 81.0 0.0 - 149.0 mg/dL   HDL 69.60 >39.00 mg/dL   VLDL 16.2 0.0 - 40.0 mg/dL   LDL Cholesterol 101 (H) 0 - 99 mg/dL   Total CHOL/HDL Ratio 3    NonHDL 117.39   TSH  Result Value Ref Range   TSH 1.00 0.35 - 4.50 uIU/mL    The 10-year ASCVD risk score Mikey Bussing DC Jr., et al., 2013) is: 4.3%   Values used to calculate the score:     Age: 46 years     Sex: Female     Is Non-Hispanic African American: Yes     Diabetic: No     Tobacco smoker: No     Systolic Blood Pressure: 298 mmHg     Is BP treated: Yes     HDL Cholesterol: 69.6 mg/dL     Total Cholesterol: 187 mg/dL

## 2020-05-29 ENCOUNTER — Encounter: Payer: Self-pay | Admitting: Family Medicine

## 2020-05-29 ENCOUNTER — Other Ambulatory Visit: Payer: Self-pay

## 2020-05-29 ENCOUNTER — Ambulatory Visit: Payer: BC Managed Care – PPO | Admitting: Family Medicine

## 2020-05-29 VITALS — BP 134/94 | HR 80 | Temp 98.0°F | Ht 67.5 in | Wt 165.2 lb

## 2020-05-29 DIAGNOSIS — Z13220 Encounter for screening for lipoid disorders: Secondary | ICD-10-CM

## 2020-05-29 DIAGNOSIS — Z23 Encounter for immunization: Secondary | ICD-10-CM

## 2020-05-29 DIAGNOSIS — Z130 Encounter for screening for diseases of the blood and blood-forming organs and certain disorders involving the immune mechanism: Secondary | ICD-10-CM | POA: Diagnosis not present

## 2020-05-29 DIAGNOSIS — Z131 Encounter for screening for diabetes mellitus: Secondary | ICD-10-CM

## 2020-05-29 DIAGNOSIS — I10 Essential (primary) hypertension: Secondary | ICD-10-CM | POA: Diagnosis not present

## 2020-05-29 NOTE — Patient Instructions (Addendum)
It was great to meet you today- take care  I will be in touch with your labs First dose of shingrix given today 2nd dose due in 2-6 months You can also have your flu shot at your convenience

## 2020-05-30 ENCOUNTER — Encounter: Payer: Self-pay | Admitting: Family Medicine

## 2020-05-30 LAB — COMPREHENSIVE METABOLIC PANEL
ALT: 12 U/L (ref 0–35)
AST: 18 U/L (ref 0–37)
Albumin: 4.4 g/dL (ref 3.5–5.2)
Alkaline Phosphatase: 73 U/L (ref 39–117)
BUN: 22 mg/dL (ref 6–23)
CO2: 32 mEq/L (ref 19–32)
Calcium: 10.1 mg/dL (ref 8.4–10.5)
Chloride: 102 mEq/L (ref 96–112)
Creatinine, Ser: 0.91 mg/dL (ref 0.40–1.20)
GFR: 70.88 mL/min (ref >60.00)
Glucose, Bld: 84 mg/dL (ref 70–99)
Potassium: 4.2 mEq/L (ref 3.5–5.1)
Sodium: 140 mEq/L (ref 135–145)
Total Bilirubin: 0.4 mg/dL (ref 0.2–1.2)
Total Protein: 7.4 g/dL (ref 6.0–8.3)

## 2020-05-30 LAB — LIPID PANEL
Cholesterol: 187 mg/dL (ref 0–200)
HDL: 69.6 mg/dL (ref >39.00)
LDL Cholesterol: 101 mg/dL — ABNORMAL HIGH (ref 0–99)
NonHDL: 117.39
Total CHOL/HDL Ratio: 3
Triglycerides: 81 mg/dL (ref 0.0–149.0)
VLDL: 16.2 mg/dL (ref 0.0–40.0)

## 2020-05-30 LAB — HEMOGLOBIN A1C: Hgb A1c MFr Bld: 5.6 % (ref 4.6–6.5)

## 2020-05-30 LAB — CBC
HCT: 39.4 % (ref 36.0–46.0)
Hemoglobin: 13.3 g/dL (ref 12.0–15.0)
MCHC: 33.9 g/dL (ref 30.0–36.0)
MCV: 89.1 fl (ref 78.0–100.0)
Platelets: 142 10*3/uL — ABNORMAL LOW (ref 150.0–400.0)
RBC: 4.42 Mil/uL (ref 3.87–5.11)
RDW: 13 % (ref 11.5–15.5)
WBC: 5.5 10*3/uL (ref 4.0–10.5)

## 2020-05-30 LAB — TSH: TSH: 1 u[IU]/mL (ref 0.35–4.50)

## 2020-06-03 DIAGNOSIS — F331 Major depressive disorder, recurrent, moderate: Secondary | ICD-10-CM | POA: Diagnosis not present

## 2020-06-24 DIAGNOSIS — F331 Major depressive disorder, recurrent, moderate: Secondary | ICD-10-CM | POA: Diagnosis not present

## 2020-06-24 IMAGING — NM NM HEPATO W/GB/PHARM/[PERSON_NAME]
2 series · 12 of 12 positions shown · non-contrast
Comparison: None.

CLINICAL DATA: Right upper quadrant pain with nausea and vomiting

EXAM:
NUCLEAR MEDICINE HEPATOBILIARY IMAGING WITH GALLBLADDER EF
VIEWS:
Anterior right upper quadrant
RADIOPHARMACEUTICALS:  5.1 mCi Dc-MMm  Choletec IV

[he hepatobiliary · 4.52mm/px · 6 of 60 frames shown (1 of 2)]
[frame 6/60]
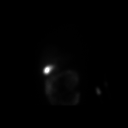
[frame 16/60]
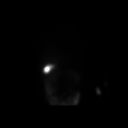
[frame 26/60]
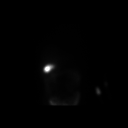
[frame 36/60]
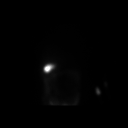
[frame 46/60]
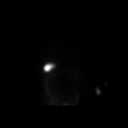
[frame 56/60]
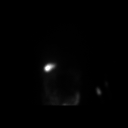

[he hepatobiliary · 4.52mm/px · 6 of 60 frames shown (2 of 2)]
[frame 6/60]
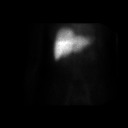
[frame 16/60]
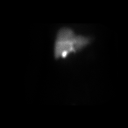
[frame 26/60]
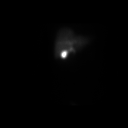
[frame 36/60]
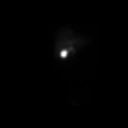
[frame 46/60]
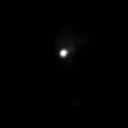
[frame 56/60]
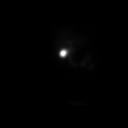

[12 of 12 positions shown; findings below may reference images not displayed]

FINDINGS: Liver uptake of radiotracer is unremarkable. There is prompt
visualization of gallbladder and small bowel, indicating patency of
the cystic and common bile ducts. The patient consumed 8 ounces of
Ensure orally with calculation of the computer generated ejection
fraction of radiotracer from the gallbladder. No report of clinical
symptoms with the oral Ensure consumption. The computer generated
ejection fraction of radiotracer from the gallbladder is within
normal limits at 48%, normal greater than 33% using the oral agent.
IMPRESSION: Study within normal limits.

## 2020-07-08 DIAGNOSIS — F331 Major depressive disorder, recurrent, moderate: Secondary | ICD-10-CM | POA: Diagnosis not present

## 2020-07-14 DIAGNOSIS — K5900 Constipation, unspecified: Secondary | ICD-10-CM | POA: Diagnosis not present

## 2020-07-14 DIAGNOSIS — D1803 Hemangioma of intra-abdominal structures: Secondary | ICD-10-CM | POA: Diagnosis not present

## 2020-07-14 DIAGNOSIS — R140 Abdominal distension (gaseous): Secondary | ICD-10-CM | POA: Diagnosis not present

## 2020-07-14 DIAGNOSIS — K219 Gastro-esophageal reflux disease without esophagitis: Secondary | ICD-10-CM | POA: Diagnosis not present

## 2020-07-22 DIAGNOSIS — F331 Major depressive disorder, recurrent, moderate: Secondary | ICD-10-CM | POA: Diagnosis not present

## 2020-08-01 ENCOUNTER — Ambulatory Visit: Payer: BC Managed Care – PPO

## 2020-08-04 ENCOUNTER — Ambulatory Visit: Payer: BC Managed Care – PPO | Admitting: Family Medicine

## 2020-08-05 ENCOUNTER — Ambulatory Visit: Payer: BC Managed Care – PPO

## 2020-08-15 ENCOUNTER — Encounter (HOSPITAL_BASED_OUTPATIENT_CLINIC_OR_DEPARTMENT_OTHER): Payer: Self-pay

## 2020-08-15 ENCOUNTER — Ambulatory Visit (HOSPITAL_BASED_OUTPATIENT_CLINIC_OR_DEPARTMENT_OTHER)
Admission: RE | Admit: 2020-08-15 | Discharge: 2020-08-15 | Disposition: A | Discharge status: Home / Self Care | Payer: BC Managed Care – PPO | Source: Ambulatory Visit | Attending: Medical | Admitting: Medical

## 2020-08-15 ENCOUNTER — Ambulatory Visit: Payer: BC Managed Care – PPO | Admitting: Medical

## 2020-08-15 ENCOUNTER — Other Ambulatory Visit: Payer: Self-pay

## 2020-08-15 ENCOUNTER — Ambulatory Visit
Admission: RE | Admit: 2020-08-15 | Discharge: 2020-08-15 | Disposition: A | Discharge status: Home / Self Care | Payer: BC Managed Care – PPO | Source: Ambulatory Visit | Attending: Medical | Admitting: Medical

## 2020-08-15 VITALS — BP 130/84 | HR 65 | Temp 98.1°F | Resp 18 | Ht 67.0 in | Wt 163.8 lb

## 2020-08-15 DIAGNOSIS — K581 Irritable bowel syndrome with constipation: Secondary | ICD-10-CM | POA: Diagnosis not present

## 2020-08-15 DIAGNOSIS — R1011 Right upper quadrant pain: Secondary | ICD-10-CM

## 2020-08-15 DIAGNOSIS — R109 Unspecified abdominal pain: Secondary | ICD-10-CM | POA: Diagnosis not present

## 2020-08-15 LAB — CBC WITH DIFFERENTIAL/PLATELET
Basophils Absolute: 0 10*3/uL (ref 0.0–0.1)
Basophils Relative: 0.4 % (ref 0.0–3.0)
Eosinophils Absolute: 0 10*3/uL (ref 0.0–0.7)
Eosinophils Relative: 1.2 % (ref 0.0–5.0)
HCT: 38.6 % (ref 36.0–46.0)
Hemoglobin: 12.8 g/dL (ref 12.0–15.0)
Lymphocytes Relative: 42.7 % (ref 12.0–46.0)
Lymphs Abs: 1.8 10*3/uL (ref 0.7–4.0)
MCHC: 33.2 g/dL (ref 30.0–36.0)
MCV: 89.2 fl (ref 78.0–100.0)
Monocytes Absolute: 0.4 10*3/uL (ref 0.1–1.0)
Monocytes Relative: 9.2 % (ref 3.0–12.0)
Neutro Abs: 1.9 10*3/uL (ref 1.4–7.7)
Neutrophils Relative %: 46.5 % (ref 43.0–77.0)
Platelets: 156 10*3/uL (ref 150.0–400.0)
RBC: 4.33 Mil/uL (ref 3.87–5.11)
RDW: 13.1 % (ref 11.5–15.5)
WBC: 4.1 10*3/uL (ref 4.0–10.5)

## 2020-08-15 LAB — COMPREHENSIVE METABOLIC PANEL
ALT: 12 U/L (ref 0–35)
AST: 18 U/L (ref 0–37)
Albumin: 4.3 g/dL (ref 3.5–5.2)
Alkaline Phosphatase: 75 U/L (ref 39–117)
BUN: 18 mg/dL (ref 6–23)
CO2: 30 mEq/L (ref 19–32)
Calcium: 9.7 mg/dL (ref 8.4–10.5)
Chloride: 102 mEq/L (ref 96–112)
Creatinine, Ser: 0.96 mg/dL (ref 0.40–1.20)
GFR: 66.38 mL/min (ref >60.00)
Glucose, Bld: 91 mg/dL (ref 70–99)
Potassium: 4.2 mEq/L (ref 3.5–5.1)
Sodium: 139 mEq/L (ref 135–145)
Total Bilirubin: 0.4 mg/dL (ref 0.2–1.2)
Total Protein: 7.1 g/dL (ref 6.0–8.3)

## 2020-08-15 LAB — LIPASE: Lipase: 40 U/L (ref 11.0–59.0)

## 2020-08-15 NOTE — Patient Instructions (Addendum)
History of recent diffuse intermittent abdomen pain with bloating.  On review you do have history of IBS-C.  Some intermittent small hard stools.  No pain on exam presently.  I do think your pain description likely represents IBS-C.  Would recommend that you restart Linzess samples that GI provider gave you recently.  I will get 1 view abdomen x-ray to assess stool burden.  If your signs and symptoms recur like 2 to 3 weeks ago or worsen/change let us know.  Also you note some concentration of pain in the right upper quadrant 2 to 3 weeks ago when pain was the worst.  We will go ahead and place abdomen ultrasound order.  Also get CBC, CMP and lipase labs.  Try to eat healthy/clean diet.  Stay well-hydrated.  Regarding your MRI done in 2020 we discussed central pelvis lesion findings.  MRI report states that that could be evaluated better by pelvic ultrasound.  Since you are going to see her gynecologist within this month inquire with them about need for a repeat pelvic ultrasound.  We will go ahead and refer you to GI MD with [facility] since you report your GI MD retired.  Follow-up in 10 to 14 days with PCP or as needed.

## 2020-08-15 NOTE — Progress Notes (Signed)
Subjective:    Patient ID: Erica [name], female    DOB: [birth year], 55 y.o.   MRN: [id number]  HPI  Pt in for recent diffuse abdomen pain and some pain in her back. Pain was upper back and intetmittent. No no back pain at all.  Pt states 2-3 weeks ago pain was the worse. She felt bloated and she felt gasy. She was passing a lot of gas at that time. Last bm yesterday small. Before that last bm 5 days ago.  Pain in abdomen was diffuse but was at time concentrated in rt upper quadrant.  Pt has GI MD in the past. Dr Percell Miller retired. Saw GI PA one month ago. Has reflux hx and ibs-C. I looked in epic and about one month ago saw [facility] physician.     In the past pt had used linzess. She stopped long time ago.      Mri in 2020 showed. IMPRESSION: 1. Cavernous hemangioma in the posterior right hepatic dome measuring up to 4.8 cm. Other scattered tiny lesions in the liver with similar signal intensity are too small to characterize but likely represent flash filling hemangiomata. 2. Rim enhancing lesion in the central pelvis has been incompletely visualized and seen only on coronal pulse sequences. This does not appear to represent bowel and would be an unusual appearance to the uterine fundus or bladder. Pelvic CT with contrast material or pelvic ultrasound recommended to further evaluate as neoplasm a Concern.  Pt states her gyn stats the area in pelvis was fibroids.    Pt last bm was day before yesterday. Prior to that was 5 days.   Abdomen pain is still intermittent and less intense. No fever, no chill, nausea or vomiting.    Review of Systems  Constitutional: Negative for chills and fever.  Respiratory: Negative for cough, chest tightness, shortness of breath and wheezing.   Cardiovascular: Negative for chest pain and palpitations.  Gastrointestinal: Positive for abdominal distention and abdominal pain. Negative for blood in stool.       Very mild presently.   Genitourinary: Negative for difficulty urinating, dysuria, frequency and urgency.  Musculoskeletal: Negative for back pain.  Skin: Negative for rash.  Neurological: Negative for dizziness, numbness and headaches.  Hematological: Negative for adenopathy. Does not bruise/bleed easily.  Psychiatric/Behavioral: Negative for behavioral problems, confusion, dysphoric mood and suicidal ideas. The patient is not nervous/anxious.     Past Medical History:  Diagnosis Date  . Anemia   . Depression   . Essential hypertension, benign   . Fibroids   . GERD (gastroesophageal reflux disease)   . HH (hiatus hernia)   . Hx of adenomatous colonic polyps   . IBS (irritable bowel syndrome)   . Osteopenia 10/2016  . SUI (stress urinary incontinence, female) 09/29/2016     Social History   Socioeconomic History  . Marital status: Single    Spouse name: Not on file  . Number of children: 0  . Years of education: Not on file  . Highest education level: Not on file  Occupational History  . Occupation: MI service specialist    Employer: [employer]  Tobacco Use  . Smoking status: Never Smoker  . Smokeless tobacco: Never Used  Vaping Use  . Vaping Use: Never used  Substance and Sexual Activity  . Alcohol use: No    Alcohol/week: 0.0 standard drinks    Comment: occ  . Drug use: No  . Sexual activity: Not Currently    Birth  control/protection: Abstinence, Post-menopausal  Other Topics Concern  . Not on file  Social History Narrative  . Not on file   Social Determinants of Health   Financial Resource Strain: Not on file  Food Insecurity: Not on file  Transportation Needs: Not on file  Physical Activity: Not on file  Stress: Not on file  Social Connections: Not on file  Intimate Partner Violence: Not on file    Past Surgical History:  Procedure Laterality Date  . COLONOSCOPY  01/01/2005   Normal   . POLYPECTOMY    . UTERINE FIBROID EMBOLIZATION      Family History  Problem  Relation Age of Onset  . Hodgkin's lymphoma Mother   . Alzheimer's disease Father   . Colon cancer Maternal Grandfather   . Stroke Paternal Grandfather   . Breast cancer Sister   . Breast cancer Maternal Aunt   . Esophageal cancer Neg Hx   . Stomach cancer Neg Hx   . Rectal cancer Neg Hx     No Known Allergies  Current Outpatient Medications on File Prior to Visit  Medication Sig Dispense Refill  . Biotin 10 MG CAPS Take by mouth.    . famotidine (PEPCID) 20 MG tablet Take 1 tablet (20 mg total) by mouth 2 (two) times daily. To reduce stomach acid 60 tablet 11  . levocetirizine (XYZAL) 5 MG tablet Take 1 tablet (5 mg total) by mouth every evening. For nasal congestion 30 tablet 11  . lisinopril-hydrochlorothiazide (ZESTORETIC) 20-25 MG tablet TAKE 1 TABLET BY MOUTH EVERY DAY 90 tablet 0  . Multiple Vitamins-Minerals (CENTRUM SILVER 50+WOMEN PO) Take by mouth.     No current facility-administered medications on file prior to visit.    BP 130/84   Pulse 65   Temp 98.1 F (36.7 C)   Resp 18   Ht 5\' 7"  (1.702 m)   Wt 163 lb 12.8 oz (74.3 kg)   LMP  (LMP Unknown)   SpO2 100%   BMI 25.65 kg/m      Objective:   Physical Exam  General Mental Status- Alert. General Appearance- Not in acute distress.   Skin General: Color- Normal Color. Moisture- Normal Moisture.  Neck Carotid Arteries- Normal color. Moisture- Normal Moisture. No carotid bruits. No JVD.  Chest and Lung Exam Auscultation: Breath Sounds:-Normal.  Cardiovascular Auscultation:Rythm- Regular. Murmurs & Other Heart Sounds:Auscultation of the heart reveals- No Murmurs.  Abdomen Inspection:-Inspeection Normal. Palpation/Percussion:Note:No mass. Palpation and Percussion of the abdomen reveal- Non Tender, Non Distended + BS, no rebound or guarding.    Neurologic Cranial Nerve exam:- CN III-XII intact(No nystagmus), symmetric smile. Strength:- 5/5 equal and symmetric strength both upper and lower  extremities.  Back-no CVA tenderness.      Assessment & Plan:  History of recent diffuse intermittent abdomen pain with bloating.  On review you do have history of IBS-C.  Some intermittent small hard stools.  No pain on exam presently.  I do think your pain description likely represents IBS-C.  Would recommend that you restart Linzess samples that GI provider gave you recently.  I will get 1 view abdomen x-ray to assess stool burden.  If your signs and symptoms recur like 2 to 3 weeks ago or worsen/change let us know.  Also you note some concentration of pain in the right upper quadrant 2 to 3 weeks ago when pain was the worst.  We will go ahead and place abdomen ultrasound order.  Also get CBC, CMP and lipase labs.  Try to eat healthy/clean diet.  Stay well-hydrated.  Regarding your MRI done in 2020 we discussed central pelvis lesion findings.  MRI report states that that could be evaluated better by pelvic ultrasound.  Since you are going to see her gynecologist within this month inquire with them about need for a repeat pelvic ultrasound.  We will go ahead and refer you to GI MD with [facility] since you report your GI MD retired.  Follow-up in 10 to 14 days with PCP or as needed.  Mackie Pai, PA-C    Time spent with patient today was 40  minutes which consisted of chart review, discussing diagnosis, work up treatment and documentation.

## 2020-08-17 ENCOUNTER — Encounter: Payer: Self-pay | Admitting: Medical

## 2020-08-17 NOTE — Progress Notes (Signed)
[place] at [facility] [address] [place],  [address] [phone]  Date:  08/20/2020   Name:  Erica [name]   DOB:  May 04, 1965   MRN:  [id number]  PCP:  Darreld Mclean, MD    Chief Complaint: Annual Exam (Sees gyn)   History of Present Illness:  Erica [name] is a 55 y.o. very pleasant female patient who presents with the following:  Here today for a CPE- history of hypertension  Last seen by myself in February She was also recently seen by Percell Miller- last week with RUQ pain  This has improved  However, she was noted to have some possible bladder calcifications on plain film.  A CT stone study is pending  She is from [place], has lived in [place] for nearly 30 years  She works for [employer] - going ok No children In her free time she enjoys exercise at the gym History of HTN, IBS, GERD, osteopenia  She has GYN care Would like 2nd shingles vaccine today - 1st dose 2/22 Give today   covid 4th dose - can do at her convenience   She has noted RUQ pain for about a month -nonspecific course, not necessarily worse after eating No vomiting She is using linzess from her GI doctor- has samples but would like an rx-the medication is really helping with her constipation.  She is not sure what strength she is using right now -she will find out and then message me    Patient Active Problem List   Diagnosis Date Noted  . Chest pain of uncertain etiology 67/89/3810  . Liver hemangioma, giant, incidental finding 09/06/2018  . Dyspepsia 07/10/2018  . Osteopenia 11/26/2016  . Vitamin D deficiency 11/26/2016  . Essential hypertension 08/04/2006  . IBS 08/04/2006    Past Medical History:  Diagnosis Date  . Anemia   . Depression   . Essential hypertension, benign   . Fibroids   . GERD (gastroesophageal reflux disease)   . HH (hiatus hernia)   . Hx of adenomatous colonic polyps   . IBS (irritable bowel syndrome)   .  Osteopenia 10/2016  . SUI (stress urinary incontinence, female) 09/29/2016    Past Surgical History:  Procedure Laterality Date  . COLONOSCOPY  01/01/2005   Normal   . POLYPECTOMY    . UTERINE FIBROID EMBOLIZATION      Social History   Tobacco Use  . Smoking status: Never Smoker  . Smokeless tobacco: Never Used  Vaping Use  . Vaping Use: Never used  Substance Use Topics  . Alcohol use: No    Alcohol/week: 0.0 standard drinks    Comment: occ  . Drug use: No    Family History  Problem Relation Age of Onset  . Hodgkin's lymphoma Mother   . Alzheimer's disease Father   . Colon cancer Maternal Grandfather   . Stroke Paternal Grandfather   . Breast cancer Sister   . Breast cancer Maternal Aunt   . Esophageal cancer Neg Hx   . Stomach cancer Neg Hx   . Rectal cancer Neg Hx     No Known Allergies  Medication list has been reviewed and updated.  Current Outpatient Medications on File Prior to Visit  Medication Sig Dispense Refill  . Biotin 10 MG CAPS Take by mouth.    . esomeprazole (NEXIUM) 40 MG capsule 1 tab po q day 30 capsule 1  . famotidine (PEPCID) 20 MG  tablet Take 1 tablet (20 mg total) by mouth 2 (two) times daily. To reduce stomach acid 60 tablet 11  . levocetirizine (XYZAL) 5 MG tablet Take 1 tablet (5 mg total) by mouth every evening. For nasal congestion 30 tablet 11  . Multiple Vitamins-Minerals (CENTRUM SILVER 50+WOMEN PO) Take by mouth.     No current facility-administered medications on file prior to visit.    Review of Systems:  As per HPI- otherwise negative.   Physical Examination: Vitals:   08/20/20 1431  BP: 116/72  Pulse: 66  Resp: 16  Temp: (!) 97.5 F (36.4 C)  SpO2: 97%   Vitals:   08/20/20 1431  Weight: 166 lb (75.3 kg)  Height: 5\' 7"  (1.702 m)   Body mass index is 26 kg/m. Ideal Body Weight: Weight in (lb) to have BMI = 25: 159.3  GEN: no acute distress.  Mild overweight, looks well HEENT: Atraumatic, Normocephalic.   Ears and Nose: No external deformity. CV: RRR, No M/G/R. No JVD. No thrill. No extra heart sounds. PULM: CTA B, no wheezes, crackles, rhonchi. No retractions. No resp. distress. No accessory muscle use. ABD: S, NT, ND, +BS. No rebound. No HSM.  She notes minimal tenderness in the right upper quadrant EXTR: No c/c/e PSYCH: Normally interactive. Conversant.    Assessment and Plan: Physical exam  Essential hypertension - Plan: lisinopril-hydrochlorothiazide (ZESTORETIC) 20-25 MG tablet  Immunization due - Plan: Varicella-zoster vaccine IM (Shingrix)  RUQ pain - Plan: US Abdomen Limited RUQ (LIVER/GB)  Chronic constipation  Need for shingles vaccine - Plan: ibuprofen (ADVIL) tablet 400 mg  Patient today for a physical exam CPE today Encouraged healthy diet and exercise routine Given shingles dose #2  Await CT stone study and right upper quadrant ultrasound-she will let me know if any symptoms are changing or worsening in the meantime Continue current blood pressure treatment-pressures well controlled This visit occurred during the SARS-CoV-2 public health emergency.  Safety protocols were in place, including screening questions prior to the visit, additional usage of staff PPE, and extensive cleaning of exam room while observing appropriate contact time as indicated for disinfecting solutions.    Signed Lamar Blinks, MD

## 2020-08-18 ENCOUNTER — Encounter: Payer: Self-pay | Admitting: Medical

## 2020-08-18 ENCOUNTER — Other Ambulatory Visit: Payer: Self-pay | Admitting: Family Medicine

## 2020-08-18 ENCOUNTER — Telehealth: Payer: Self-pay | Admitting: Medical

## 2020-08-18 ENCOUNTER — Telehealth: Payer: Self-pay

## 2020-08-18 ENCOUNTER — Encounter: Payer: Self-pay | Admitting: Family Medicine

## 2020-08-18 DIAGNOSIS — I10 Essential (primary) hypertension: Secondary | ICD-10-CM

## 2020-08-18 DIAGNOSIS — K581 Irritable bowel syndrome with constipation: Secondary | ICD-10-CM

## 2020-08-18 MED ORDER — ESOMEPRAZOLE MAGNESIUM 40 MG PO CPDR: DELAYED_RELEASE_CAPSULE | ORAL | 1 refills | Status: DC

## 2020-08-18 NOTE — Telephone Encounter (Signed)
Reviewed/see result note.

## 2020-08-18 NOTE — Telephone Encounter (Signed)
CRITICAL VALUE STICKER  CRITICAL VALUE: US abdomen complete/ results pt has stones in the bladder may need CT scan for further evaluation  RECEIVER (on-site recipient of call): Lotoya Casella  DATE & TIME NOTIFIED: 08/18/20 12:40pm  MESSENGER (representative from lab):  MD NOTIFIED:[name] and Dr. Lorelei Pont  TIME OF NOTIFICATION:12:45pm  RESPONSE:

## 2020-08-18 NOTE — Telephone Encounter (Signed)
nexium rx sent.

## 2020-08-19 ENCOUNTER — Telehealth: Payer: Self-pay | Admitting: Family Medicine

## 2020-08-19 DIAGNOSIS — N210 Calculus in bladder: Secondary | ICD-10-CM

## 2020-08-19 NOTE — Telephone Encounter (Signed)
-----   Message from Darreld Mclean, MD sent at 08/19/2020  7:23 AM EDT ----- Call and ask radiology about contrast

## 2020-08-20 ENCOUNTER — Encounter: Payer: Self-pay | Admitting: Family Medicine

## 2020-08-20 ENCOUNTER — Ambulatory Visit (INDEPENDENT_AMBULATORY_CARE_PROVIDER_SITE_OTHER): Payer: BC Managed Care – PPO | Admitting: Family Medicine

## 2020-08-20 ENCOUNTER — Other Ambulatory Visit: Payer: Self-pay

## 2020-08-20 VITALS — BP 116/72 | HR 66 | Temp 97.5°F | Resp 16 | Ht 67.0 in | Wt 166.0 lb

## 2020-08-20 DIAGNOSIS — R1011 Right upper quadrant pain: Secondary | ICD-10-CM | POA: Diagnosis not present

## 2020-08-20 DIAGNOSIS — K5909 Other constipation: Secondary | ICD-10-CM | POA: Diagnosis not present

## 2020-08-20 DIAGNOSIS — I10 Essential (primary) hypertension: Secondary | ICD-10-CM

## 2020-08-20 DIAGNOSIS — Z23 Encounter for immunization: Secondary | ICD-10-CM

## 2020-08-20 DIAGNOSIS — Z0000 Encounter for general adult medical examination without abnormal findings: Secondary | ICD-10-CM

## 2020-08-20 MED ORDER — IBUPROFEN 200 MG PO TABS
400.0000 mg | ORAL_TABLET | Freq: Once | ORAL | Status: AC
  Administered 2020-08-20: 400 mg via ORAL

## 2020-08-20 MED ORDER — LINACLOTIDE 72 MCG PO CAPS: 72.0000 ug | ORAL_CAPSULE | Freq: Every day | ORAL | 11 refills | Status: DC

## 2020-08-20 MED ORDER — LISINOPRIL-HYDROCHLOROTHIAZIDE 20-25 MG PO TABS: 1.0000 | ORAL_TABLET | Freq: Every day | ORAL | 3 refills | Status: DC

## 2020-08-20 NOTE — Patient Instructions (Signed)
Good to see you today- you got your 2nd shingles shot, you can get your last covid vaccine at your convenience Please call [facility] and schedule your CT abdomen and your gallbladder ultrasound- I hope these can both be done at the same visit  Phone: [phone]  You can get your 4th dose of covid 19 vaccine at your convenience  Take care!  Let me know what dose of linzes you are taking when you get home    Health Maintenance, Female Adopting a healthy lifestyle and getting preventive care are important in promoting health and wellness. Ask your health care provider about:  The right schedule for you to have regular tests and exams.  Things you can do on your own to prevent diseases and keep yourself healthy. What should I know about diet, weight, and exercise? Eat a healthy diet  Eat a diet that includes plenty of vegetables, fruits, low-fat dairy products, and lean protein.  Do not eat a lot of foods that are high in solid fats, added sugars, or sodium.   Maintain a healthy weight Body mass index (BMI) is used to identify weight problems. It estimates body fat based on height and weight. Your health care provider can help determine your BMI and help you achieve or maintain a healthy weight. Get regular exercise Get regular exercise. This is one of the most important things you can do for your health. Most adults should:  Exercise for at least 150 minutes each week. The exercise should increase your heart rate and make you sweat (moderate-intensity exercise).  Do strengthening exercises at least twice a week. This is in addition to the moderate-intensity exercise.  Spend less time sitting. Even light physical activity can be beneficial. Watch cholesterol and blood lipids Have your blood tested for lipids and cholesterol at 56 years of age, then have this test every 5 years. Have your cholesterol levels checked more often if:  Your lipid or cholesterol levels are  high.  You are older than 56 years of age.  You are at high risk for heart disease. What should I know about cancer screening? Depending on your health history and family history, you may need to have cancer screening at various ages. This may include screening for:  Breast cancer.  Cervical cancer.  Colorectal cancer.  Skin cancer.  Lung cancer. What should I know about heart disease, diabetes, and high blood pressure? Blood pressure and heart disease  High blood pressure causes heart disease and increases the risk of stroke. This is more likely to develop in people who have high blood pressure readings, are of African descent, or are overweight.  Have your blood pressure checked: ? Every 3-5 years if you are 81-35 years of age. ? Every year if you are 74 years old or older. Diabetes Have regular diabetes screenings. This checks your fasting blood sugar level. Have the screening done:  Once every three years after age 49 if you are at a normal weight and have a low risk for diabetes.  More often and at a younger age if you are overweight or have a high risk for diabetes. What should I know about preventing infection? Hepatitis B If you have a higher risk for hepatitis B, you should be screened for this virus. Talk with your health care provider to find out if you are at risk for hepatitis B infection. Hepatitis C Testing is recommended for:  Everyone born from [birth year] through 1966.  Anyone with known  risk factors for hepatitis C. Sexually transmitted infections (STIs)  Get screened for STIs, including gonorrhea and chlamydia, if: ? You are sexually active and are younger than 56 years of age. ? You are older than 56 years of age and your health care provider tells you that you are at risk for this type of infection. ? Your sexual activity has changed since you were last screened, and you are at increased risk for chlamydia or gonorrhea. Ask your health care provider if you  are at risk.  Ask your health care provider about whether you are at high risk for HIV. Your health care provider may recommend a prescription medicine to help prevent HIV infection. If you choose to take medicine to prevent HIV, you should first get tested for HIV. You should then be tested every 3 months for as long as you are taking the medicine. Pregnancy  If you are about to stop having your period (premenopausal) and you may become pregnant, seek counseling before you get pregnant.  Take 400 to 800 micrograms (mcg) of folic acid every day if you become pregnant.  Ask for birth control (contraception) if you want to prevent pregnancy. Osteoporosis and menopause Osteoporosis is a disease in which the bones lose minerals and strength with aging. This can result in bone fractures. If you are 28 years old or older, or if you are at risk for osteoporosis and fractures, ask your health care provider if you should:  Be screened for bone loss.  Take a calcium or vitamin D supplement to lower your risk of fractures.  Be given hormone replacement therapy (HRT) to treat symptoms of menopause. Follow these instructions at home: Lifestyle  Do not use any products that contain nicotine or tobacco, such as cigarettes, e-cigarettes, and chewing tobacco. If you need help quitting, ask your health care provider.  Do not use street drugs.  Do not share needles.  Ask your health care provider for help if you need support or information about quitting drugs. Alcohol use  Do not drink alcohol if: ? Your health care provider tells you not to drink. ? You are pregnant, may be pregnant, or are planning to become pregnant.  If you drink alcohol: ? Limit how much you use to 0-1 drink a day. ? Limit intake if you are breastfeeding.  Be aware of how much alcohol is in your drink. In the [place] one drink equals one 12 oz bottle of beer (355 mL), one 5 oz glass of wine (148 mL), or one 1 oz glass of hard  liquor (44 mL). General instructions  Schedule regular health, dental, and eye exams.  Stay current with your vaccines.  Tell your health care provider if: ? You often feel depressed. ? You have ever been abused or do not feel safe at home. Summary  Adopting a healthy lifestyle and getting preventive care are important in promoting health and wellness.  Follow your health care provider's instructions about healthy diet, exercising, and getting tested or screened for diseases.  Follow your health care provider's instructions on monitoring your cholesterol and blood pressure. This information is not intended to replace advice given to you by your health care provider. Make sure you discuss any questions you have with your health care provider. Document Revised: 03/22/2018 Document Reviewed: 03/22/2018 Elsevier Patient Education  2021 [employer].

## 2020-08-25 ENCOUNTER — Ambulatory Visit
Admission: RE | Admit: 2020-08-25 | Discharge: 2020-08-25 | Disposition: A | Discharge status: Home / Self Care | Payer: BC Managed Care – PPO | Source: Ambulatory Visit | Attending: Family Medicine | Admitting: Family Medicine

## 2020-08-25 DIAGNOSIS — D259 Leiomyoma of uterus, unspecified: Secondary | ICD-10-CM | POA: Diagnosis not present

## 2020-08-25 DIAGNOSIS — D1803 Hemangioma of intra-abdominal structures: Secondary | ICD-10-CM | POA: Diagnosis not present

## 2020-08-25 DIAGNOSIS — N210 Calculus in bladder: Secondary | ICD-10-CM

## 2020-08-26 ENCOUNTER — Encounter: Payer: Self-pay | Admitting: Family Medicine

## 2020-08-26 ENCOUNTER — Telehealth: Payer: Self-pay | Admitting: Family Medicine

## 2020-08-26 NOTE — Telephone Encounter (Signed)
Patient stated she got her ct result through my chart. She would like to know what is next

## 2020-08-26 NOTE — Telephone Encounter (Signed)
I dont see a note from CT results. Please advise and I will call patient to discuss.

## 2020-08-27 ENCOUNTER — Encounter: Payer: Self-pay | Admitting: Family Medicine

## 2020-08-28 ENCOUNTER — Ambulatory Visit: Payer: BC Managed Care – PPO | Admitting: Obstetrics

## 2020-08-28 NOTE — Telephone Encounter (Signed)
Called imaging to inquire about her RUQ [place] which has not been scheduled- we are not sure why this was not scheduled, but they will contact the patient

## 2020-09-01 ENCOUNTER — Ambulatory Visit (HOSPITAL_BASED_OUTPATIENT_CLINIC_OR_DEPARTMENT_OTHER): Payer: BC Managed Care – PPO

## 2020-09-02 DIAGNOSIS — F331 Major depressive disorder, recurrent, moderate: Secondary | ICD-10-CM | POA: Diagnosis not present

## 2020-09-04 ENCOUNTER — Other Ambulatory Visit: Payer: Self-pay | Admitting: Family Medicine

## 2020-09-04 DIAGNOSIS — Z1231 Encounter for screening mammogram for malignant neoplasm of breast: Secondary | ICD-10-CM

## 2020-09-05 ENCOUNTER — Other Ambulatory Visit: Payer: Self-pay

## 2020-09-05 ENCOUNTER — Ambulatory Visit (HOSPITAL_BASED_OUTPATIENT_CLINIC_OR_DEPARTMENT_OTHER)
Admission: RE | Admit: 2020-09-05 | Discharge: 2020-09-05 | Disposition: A | Discharge status: Home / Self Care | Payer: BC Managed Care – PPO | Source: Ambulatory Visit | Attending: Family Medicine | Admitting: Family Medicine

## 2020-09-05 DIAGNOSIS — R1011 Right upper quadrant pain: Secondary | ICD-10-CM | POA: Insufficient documentation

## 2020-09-05 DIAGNOSIS — R109 Unspecified abdominal pain: Secondary | ICD-10-CM | POA: Diagnosis not present

## 2020-09-06 ENCOUNTER — Encounter: Payer: Self-pay | Admitting: Family Medicine

## 2020-09-14 ENCOUNTER — Other Ambulatory Visit: Payer: Self-pay | Admitting: Medical

## 2020-09-17 DIAGNOSIS — F331 Major depressive disorder, recurrent, moderate: Secondary | ICD-10-CM | POA: Diagnosis not present

## 2020-09-30 DIAGNOSIS — F331 Major depressive disorder, recurrent, moderate: Secondary | ICD-10-CM | POA: Diagnosis not present

## 2020-10-02 ENCOUNTER — Other Ambulatory Visit: Payer: Self-pay | Admitting: Family Medicine

## 2020-10-02 DIAGNOSIS — J309 Allergic rhinitis, unspecified: Secondary | ICD-10-CM

## 2020-10-02 NOTE — Telephone Encounter (Signed)
  Notes to clinic: Review for refill Looks like patient has new pcp    Requested Prescriptions  Pending Prescriptions Disp Refills   levocetirizine (XYZAL) 5 MG tablet [Pharmacy Med Name: LEVOCETIRIZINE 5 MG TABLET] 90 tablet 8    Sig: TAKE 1 TABLET (5 MG TOTAL) BY MOUTH EVERY EVENING. FOR NASAL CONGESTION      Ear, Nose, and Throat:  Antihistamines Passed - 10/02/2020  1:29 AM      Passed - Valid encounter within last 12 months    Recent Outpatient Visits           8 months ago Essential hypertension   [facility] Fulp, Del Dios, MD   9 months ago Essential hypertension   [place], Charlane Ferretti, MD   1 year ago Vaginal irritation   [place]an, Enobong, MD   1 year ago Chronic constipation   [facility] Fulp, [place], MD   1 year ago Essential hypertension   [facility] Antony Blackbird, MD

## 2020-10-12 ENCOUNTER — Other Ambulatory Visit: Payer: Self-pay | Admitting: Medical

## 2020-10-14 ENCOUNTER — Other Ambulatory Visit: Payer: Self-pay | Admitting: Medical

## 2020-10-14 DIAGNOSIS — F331 Major depressive disorder, recurrent, moderate: Secondary | ICD-10-CM | POA: Diagnosis not present

## 2020-10-24 ENCOUNTER — Encounter: Payer: Self-pay | Admitting: Family Medicine

## 2020-10-24 ENCOUNTER — Telehealth: Payer: Self-pay

## 2020-10-24 MED ORDER — LEVOCETIRIZINE DIHYDROCHLORIDE 5 MG PO TABS: 5.0000 mg | ORAL_TABLET | Freq: Every evening | ORAL | 3 refills | Status: DC

## 2020-10-24 NOTE — Telephone Encounter (Signed)
Pt called stating she would like to get a refill on xyzal 5 mg tabs.  PCP has not written this script for her before and is wanting to know if she will.  If she cannot, she is wanting to know if there is an alternative that she can take OTC that is comparable to it.  Please advise.

## 2020-10-24 NOTE — Telephone Encounter (Signed)
Sure, done

## 2020-10-28 ENCOUNTER — Other Ambulatory Visit: Payer: Self-pay | Admitting: Medical

## 2020-11-04 DIAGNOSIS — F329 Major depressive disorder, single episode, unspecified: Secondary | ICD-10-CM | POA: Diagnosis not present

## 2020-11-07 ENCOUNTER — Ambulatory Visit
Admission: RE | Admit: 2020-11-07 | Discharge: 2020-11-07 | Disposition: A | Discharge status: Home / Self Care | Payer: BC Managed Care – PPO | Source: Ambulatory Visit | Attending: Family Medicine | Admitting: Family Medicine

## 2020-11-07 ENCOUNTER — Other Ambulatory Visit: Payer: Self-pay

## 2020-11-07 DIAGNOSIS — Z1231 Encounter for screening mammogram for malignant neoplasm of breast: Secondary | ICD-10-CM

## 2020-11-12 ENCOUNTER — Encounter: Payer: Self-pay | Admitting: Obstetrics

## 2020-11-12 ENCOUNTER — Other Ambulatory Visit: Payer: Self-pay

## 2020-11-12 ENCOUNTER — Ambulatory Visit (INDEPENDENT_AMBULATORY_CARE_PROVIDER_SITE_OTHER): Payer: BC Managed Care – PPO | Admitting: Obstetrics

## 2020-11-12 ENCOUNTER — Other Ambulatory Visit (HOSPITAL_COMMUNITY)
Admission: RE | Admit: 2020-11-12 | Discharge: 2020-11-12 | Disposition: A | Discharge status: Home / Self Care | Payer: BC Managed Care – PPO | Source: Ambulatory Visit | Attending: Obstetrics | Admitting: Obstetrics

## 2020-11-12 VITALS — BP 157/96 | Wt 166.9 lb

## 2020-11-12 DIAGNOSIS — N898 Other specified noninflammatory disorders of vagina: Secondary | ICD-10-CM

## 2020-11-12 DIAGNOSIS — Z780 Asymptomatic menopausal state: Secondary | ICD-10-CM | POA: Diagnosis not present

## 2020-11-12 DIAGNOSIS — Z113 Encounter for screening for infections with a predominantly sexual mode of transmission: Secondary | ICD-10-CM | POA: Diagnosis not present

## 2020-11-12 DIAGNOSIS — Z01419 Encounter for gynecological examination (general) (routine) without abnormal findings: Secondary | ICD-10-CM | POA: Insufficient documentation

## 2020-11-12 NOTE — Progress Notes (Signed)
Subjective:        Erica [name] is a 55 y.o. female here for a routine exam.  Current complaints: Vaginal discharge.    Personal health questionnaire:  Is patient [name], have a family history of breast and/or ovarian cancer: yes Is there a family history of uterine cancer diagnosed at age < 59, gastrointestinal cancer, urinary tract cancer, family member who is a Field seismologist syndrome-associated carrier: yes Is the patient overweight and hypertensive, family history of diabetes, personal history of gestational diabetes, preeclampsia or PCOS: no Is patient over 84, have PCOS,  family history of premature CHD under age 42, diabetes, smoke, have hypertension or peripheral artery disease:  yes At any time, has a partner hit, kicked or otherwise hurt or frightened you?: no Over the past 2 weeks, have you felt down, depressed or hopeless?: no Over the past 2 weeks, have you felt little interest or pleasure in doing things?:no   Gynecologic History No LMP recorded (lmp unknown). Patient is postmenopausal. Contraception: post menopausal status Last Pap: 10-22-2019. Results were: normal Last mammogram: 7-29=2022. Results were: normal  Obstetric History OB History  No obstetric history on file.    Past Medical History:  Diagnosis Date   Anemia    Depression    Essential hypertension, benign    Fibroids    GERD (gastroesophageal reflux disease)    HH (hiatus hernia)    Hx of adenomatous colonic polyps    IBS (irritable bowel syndrome)    Osteopenia 10/2016   SUI (stress urinary incontinence, female) 09/29/2016    Past Surgical History:  Procedure Laterality Date   COLONOSCOPY  01/01/2005   Normal    POLYPECTOMY     UTERINE FIBROID EMBOLIZATION       Current Outpatient Medications:    famotidine (PEPCID) 20 MG tablet, Take 1 tablet (20 mg total) by mouth 2 (two) times daily. To reduce stomach acid, Disp: 60 tablet, Rfl: 11   levocetirizine (XYZAL) 5 MG tablet,  Take 1 tablet (5 mg total) by mouth every evening., Disp: 90 tablet, Rfl: 3   lisinopril-hydrochlorothiazide (ZESTORETIC) 20-25 MG tablet, Take 1 tablet by mouth daily., Disp: 90 tablet, Rfl: 3   Biotin 10 MG CAPS, Take by mouth. (Patient not taking: Reported on 11/12/2020), Disp: , Rfl:    esomeprazole (NEXIUM) 40 MG capsule, TAKE 1 CAPSULE BY MOUTH EVERY DAY (Patient not taking: Reported on 11/12/2020), Disp: 90 capsule, Rfl: 1   levocetirizine (XYZAL) 5 MG tablet, Take 1 tablet (5 mg total) by mouth every evening. For nasal congestion, Disp: 30 tablet, Rfl: 11   linaclotide (LINZESS) 72 MCG capsule, Take 1 capsule (72 mcg total) by mouth daily before breakfast., Disp: 30 capsule, Rfl: 11   Multiple Vitamins-Minerals (CENTRUM SILVER 50+WOMEN PO), Take by mouth. (Patient not taking: Reported on 11/12/2020), Disp: , Rfl:  No Known Allergies  Social History   Tobacco Use   Smoking status: Never   Smokeless tobacco: Never  Substance Use Topics   Alcohol use: No    Alcohol/week: 0.0 standard drinks    Comment: occ    Family History  Problem Relation Age of Onset   Hodgkin's lymphoma Mother    Alzheimer's disease Father    Colon cancer Maternal Grandfather    Stroke Paternal Grandfather    Breast cancer Sister    Breast cancer Maternal Aunt    Esophageal cancer Neg Hx    Stomach cancer Neg Hx    Rectal cancer Neg Hx  Review of Systems  Constitutional: negative for fatigue and weight loss Respiratory: negative for cough and wheezing Cardiovascular: negative for chest pain, fatigue and palpitations Gastrointestinal: negative for abdominal pain and change in bowel habits Musculoskeletal:negative for myalgias Neurological: negative for gait problems and tremors Behavioral/Psych: negative for abusive relationship, depression Endocrine: negative for temperature intolerance    Genitourinary:positive for vaginal discharge.  negative for abnormal menstrual periods, genital lesions, hot  flashes, sexual problems Integument/breast: negative for breast lump, breast tenderness, nipple discharge and skin lesion(s)    Objective:       BP (!) 155/91   Wt 166 lb 14.4 oz (75.7 kg)   LMP  (LMP Unknown)   BMI 26.14 kg/m  General:   Alert and no distress   Skin:   no rash or abnormalities  Lungs:   clear to auscultation bilaterally  Heart:   regular rate and rhythm, S1, S2 normal, no murmur, click, rub or gallop  Breasts:   normal without suspicious masses, skin or nipple changes or axillary nodes  Abdomen:  normal findings: no organomegaly, soft, non-tender and no hernia  Pelvis:  External genitalia: normal general appearance Urinary system: urethral meatus normal and bladder without fullness, nontender Vaginal: normal without tenderness, induration or masses Cervix: normal appearance Adnexa: normal bimanual exam Uterus: anteverted and non-tender, normal size   Lab Review Urine pregnancy test Labs reviewed yes Radiologic studies reviewed yes  I have spent a total of 20 minutes of face-to-face time, excluding clinical staff time, reviewing notes and preparing to see patient, ordering tests and/or medications, and counseling the patient.   Assessment:    1. Encounter for routine gynecological examination with Papanicolaou smear of cervix Rx: - Cytology - PAP( [facility])  2. Vaginal discharge Rx: - Cervicovaginal ancillary only  3. Screening for STD (sexually transmitted disease) Rx: - Hepatitis B surface antigen - Hepatitis C antibody - HIV Antibody (routine testing w rflx) - RPR  4. Postmenopause - doing well     Plan:    Education reviewed: calcium supplements, depression evaluation, low fat, low cholesterol diet, safe sex/STD prevention, self breast exams, and weight bearing exercise. Follow up in: 1 year.     Shelly Bombard, MD  11/12/2020 4:08 PM

## 2020-11-12 NOTE — Progress Notes (Signed)
Pt presents for annual, STD testing, and pap smear.  Normal Mammogram completed 11/07/20. Normal Colonoscopy completed 2021 per pt

## 2020-11-13 LAB — HEPATITIS B SURFACE ANTIGEN: Hepatitis B Surface Ag: NEGATIVE

## 2020-11-13 LAB — HEPATITIS C ANTIBODY: Hep C Virus Ab: <0.1 s/co ratio (ref 0.0–0.9)

## 2020-11-13 LAB — HIV ANTIBODY (ROUTINE TESTING W REFLEX): HIV Screen 4th Generation wRfx: NONREACTIVE

## 2020-11-13 LAB — RPR: RPR Ser Ql: NONREACTIVE

## 2020-11-14 LAB — CERVICOVAGINAL ANCILLARY ONLY
Bacterial Vaginitis (gardnerella): NEGATIVE
Candida Glabrata: NEGATIVE
Candida Vaginitis: NEGATIVE
Chlamydia: NEGATIVE
Comment: NEGATIVE
Comment: NEGATIVE
Comment: NEGATIVE
Comment: NEGATIVE
Comment: NEGATIVE
Comment: NORMAL
Neisseria Gonorrhea: NEGATIVE
Trichomonas: NEGATIVE

## 2020-11-17 LAB — CYTOLOGY - PAP
Comment: NEGATIVE
Diagnosis: NEGATIVE
High risk HPV: NEGATIVE

## 2020-11-18 DIAGNOSIS — F329 Major depressive disorder, single episode, unspecified: Secondary | ICD-10-CM | POA: Diagnosis not present

## 2020-12-02 DIAGNOSIS — F329 Major depressive disorder, single episode, unspecified: Secondary | ICD-10-CM | POA: Diagnosis not present

## 2020-12-30 DIAGNOSIS — F329 Major depressive disorder, single episode, unspecified: Secondary | ICD-10-CM | POA: Diagnosis not present

## 2021-01-03 NOTE — Progress Notes (Addendum)
[facility] [place], [place], [place] [address] [phone]  Date:  01/05/2021   Name:  Erica [name]   DOB:  01-19-1966   MRN:  [id number]  PCP:  Darreld Mclean, MD    Chief Complaint: Follow-up (Blood pressure./Concerns/ questions: pt says her bp was high at her GYN appointment and she was having some dizziness. /Flu shot today:yes/)   History of Present Illness:  Erica [name] is a 55 y.o. very pleasant female patient who presents with the following:  Patient seen today for hypertension follow-up Last visit with myself was in May  She is from [place], has lived in [place] for nearly 30 years  She works for [employer] - going ok No children In her free time she enjoys exercise at the gym  She was seen by her gynecologist last month and noted to have elevated blood pressure She is taking lisinopril/HCTZ 20/25 Her blood pressure has been occasionally elevated over the last year or so on her current dose of medication BP Readings from Last 3 Encounters:  01/05/21 (!) 150/90  11/12/20 (!) 157/96  08/20/20 116/72   COVID-19 new booster Flu vaccine-we will give today Mammogram, Pap, colonoscopy up-to-date Shingrix is complete CMP, CBC, A1c, thyroid on chart from May  She notes that she is under a lot of stress at her job She is trying to cut down on her salt and also her sugar intake-she was concerned about correlation between high blood pressure and diabetes Her mother and sister have HTN She has started exercising more   She also was having dizziness last week.  Asked her more about this, it sounds typical of BPPV.  It is currently resolved  No chest pain or shortness of breath  Patient Active Problem List   Diagnosis Date Noted   Chest pain of uncertain etiology 37/16/9678   Liver hemangioma, giant, incidental finding 09/06/2018   Dyspepsia 07/10/2018   Osteopenia 11/26/2016   Vitamin D  deficiency 11/26/2016   Essential hypertension 08/04/2006   IBS 08/04/2006    Past Medical History:  Diagnosis Date   Anemia    Depression    Essential hypertension, benign    Fibroids    GERD (gastroesophageal reflux disease)    HH (hiatus hernia)    Hx of adenomatous colonic polyps    IBS (irritable bowel syndrome)    Osteopenia 10/2016   SUI (stress urinary incontinence, female) 09/29/2016    Past Surgical History:  Procedure Laterality Date   COLONOSCOPY  01/01/2005   Normal    POLYPECTOMY     UTERINE FIBROID EMBOLIZATION      Social History   Tobacco Use   Smoking status: Never   Smokeless tobacco: Never  Vaping Use   Vaping Use: Never used  Substance Use Topics   Alcohol use: No    Alcohol/week: 0.0 standard drinks    Comment: occ   Drug use: No    Family History  Problem Relation Age of Onset   Hodgkin's lymphoma Mother    Alzheimer's disease Father    Colon cancer Maternal Grandfather    Stroke Paternal Grandfather    Breast cancer Sister    Breast cancer Maternal Aunt    Esophageal cancer Neg Hx    Stomach cancer Neg Hx    Rectal cancer Neg Hx     No Known Allergies  Medication list has been reviewed and updated.  Current  Outpatient Medications on File Prior to Visit  Medication Sig Dispense Refill   Biotin 10 MG CAPS Take by mouth.     esomeprazole (NEXIUM) 40 MG capsule TAKE 1 CAPSULE BY MOUTH EVERY DAY 90 capsule 1   famotidine (PEPCID) 20 MG tablet Take 1 tablet (20 mg total) by mouth 2 (two) times daily. To reduce stomach acid 60 tablet 11   levocetirizine (XYZAL) 5 MG tablet Take 1 tablet (5 mg total) by mouth every evening. For nasal congestion 30 tablet 11   levocetirizine (XYZAL) 5 MG tablet Take 1 tablet (5 mg total) by mouth every evening. 90 tablet 3   linaclotide (LINZESS) 72 MCG capsule Take 1 capsule (72 mcg total) by mouth daily before breakfast. 30 capsule 11   lisinopril-hydrochlorothiazide (ZESTORETIC) 20-25 MG tablet Take 1  tablet by mouth daily. 90 tablet 3   Multiple Vitamins-Minerals (CENTRUM SILVER 50+WOMEN PO) Take by mouth.     No current facility-administered medications on file prior to visit.    Review of Systems:  As per HPI- otherwise negative.   Physical Examination: Vitals:   01/05/21 1425  BP: (!) 150/90  Pulse: 71  Resp: 18  Temp: 98.1 F (36.7 C)  SpO2: 99%   Vitals:   01/05/21 1425  Weight: 165 lb 6.4 oz (75 kg)  Height: 5\' 7"  (1.702 m)   Body mass index is 25.91 kg/m. Ideal Body Weight: Weight in (lb) to have BMI = 25: 159.3  GEN: no acute distress.  Minimal overweight, looks well HEENT: Atraumatic, Normocephalic.  Ears and Nose: No external deformity. CV: RRR, No M/G/R. No JVD. No thrill. No extra heart sounds. PULM: CTA B, no wheezes, crackles, rhonchi. No retractions. No resp. distress. No accessory muscle use. ABD: S, NT, ND. No rebound. No HSM. EXTR: No c/c/e PSYCH: Normally interactive. Conversant.    Assessment and Plan: Essential hypertension - Plan: lisinopril-hydrochlorothiazide (ZESTORETIC) 20-12.5 MG tablet, Basic metabolic panel, CBC, TSH  Screening for diabetes mellitus - Plan: Hemoglobin A1c  Need for influenza vaccination - Plan: Flu Vaccine QUAD 6+ mos PF IM (Fluarix Quad PF)  Patient seen today for elevation of blood pressure, she is compliant with her lisinopril/HCTZ.  We will increase her dose of lisinopril to 40 mg total, continue HCTZ 25 She plans to either get a home blood pressure cuff, or schedule a nurse visit in about 2 weeks for follow-up  Follow-up on blood sugar with A1c  Flu vaccine given  This visit occurred during the SARS-CoV-2 public health emergency.  Safety protocols were in place, including screening questions prior to the visit, additional usage of staff PPE, and extensive cleaning of exam room while observing appropriate contact time as indicated for disinfecting solutions.   Signed Lamar Blinks, MD  Addnd 9/27-  received labs as below, message to pt  Results for orders placed or performed in visit on 44/81/85  Basic metabolic panel  Result Value Ref Range   Sodium 137 135 - 145 mEq/L   Potassium 3.9 3.5 - 5.1 mEq/L   Chloride 100 96 - 112 mEq/L   CO2 29 19 - 32 mEq/L   Glucose, Bld 88 70 - 99 mg/dL   BUN 16 6 - 23 mg/dL   Creatinine, Ser 0.87 0.40 - 1.20 mg/dL   GFR 74.49 >60.00 mL/min   Calcium 9.9 8.4 - 10.5 mg/dL  CBC  Result Value Ref Range   WBC 5.0 4.0 - 10.5 K/uL   RBC 4.18 3.87 - 5.11 Mil/uL  Platelets 151.0 150.0 - 400.0 K/uL   Hemoglobin 12.4 12.0 - 15.0 g/dL   HCT 37.2 36.0 - 46.0 %   MCV 89.0 78.0 - 100.0 fl   MCHC 33.3 30.0 - 36.0 g/dL   RDW 13.2 11.5 - 15.5 %  TSH  Result Value Ref Range   TSH 0.76 0.35 - 5.50 uIU/mL  Hemoglobin A1c  Result Value Ref Range   Hgb A1c MFr Bld 5.8 4.6 - 6.5 %

## 2021-01-05 ENCOUNTER — Ambulatory Visit: Payer: BC Managed Care – PPO | Admitting: Family Medicine

## 2021-01-05 ENCOUNTER — Other Ambulatory Visit: Payer: Self-pay

## 2021-01-05 VITALS — BP 150/90 | HR 71 | Temp 98.1°F | Resp 18 | Ht 67.0 in | Wt 165.4 lb

## 2021-01-05 DIAGNOSIS — I10 Essential (primary) hypertension: Secondary | ICD-10-CM | POA: Diagnosis not present

## 2021-01-05 DIAGNOSIS — Z131 Encounter for screening for diabetes mellitus: Secondary | ICD-10-CM

## 2021-01-05 DIAGNOSIS — Z23 Encounter for immunization: Secondary | ICD-10-CM | POA: Diagnosis not present

## 2021-01-05 MED ORDER — LISINOPRIL-HYDROCHLOROTHIAZIDE 20-12.5 MG PO TABS
2.0000 | ORAL_TABLET | Freq: Every day | ORAL | 3 refills | Status: DC
Start: 2021-01-05 — End: 2021-05-08

## 2021-01-05 NOTE — Patient Instructions (Signed)
We will increase your BP medication to 40mg / hctz 25 mg daily - this should bring your BP down Please schedule a nurse visit in about 2 weeks for a BP check, or check your BP at home if you prefer  Goal BP 135/85 or less I will be in touch with your labs asap  Flu shot given Please get the updated covid booster this fall as well

## 2021-01-06 ENCOUNTER — Encounter: Payer: Self-pay | Admitting: Family Medicine

## 2021-01-06 LAB — HEMOGLOBIN A1C: Hgb A1c MFr Bld: 5.8 % (ref 4.6–6.5)

## 2021-01-06 LAB — BASIC METABOLIC PANEL
BUN: 16 mg/dL (ref 6–23)
CO2: 29 mEq/L (ref 19–32)
Calcium: 9.9 mg/dL (ref 8.4–10.5)
Chloride: 100 mEq/L (ref 96–112)
Creatinine, Ser: 0.87 mg/dL (ref 0.40–1.20)
GFR: 74.49 mL/min (ref >60.00)
Glucose, Bld: 88 mg/dL (ref 70–99)
Potassium: 3.9 mEq/L (ref 3.5–5.1)
Sodium: 137 mEq/L (ref 135–145)

## 2021-01-06 LAB — TSH: TSH: 0.76 u[IU]/mL (ref 0.35–5.50)

## 2021-01-06 LAB — CBC
HCT: 37.2 % (ref 36.0–46.0)
Hemoglobin: 12.4 g/dL (ref 12.0–15.0)
MCHC: 33.3 g/dL (ref 30.0–36.0)
MCV: 89 fl (ref 78.0–100.0)
Platelets: 151 10*3/uL (ref 150.0–400.0)
RBC: 4.18 Mil/uL (ref 3.87–5.11)
RDW: 13.2 % (ref 11.5–15.5)
WBC: 5 10*3/uL (ref 4.0–10.5)

## 2021-01-13 DIAGNOSIS — F329 Major depressive disorder, single episode, unspecified: Secondary | ICD-10-CM | POA: Diagnosis not present

## 2021-01-27 DIAGNOSIS — F329 Major depressive disorder, single episode, unspecified: Secondary | ICD-10-CM | POA: Diagnosis not present

## 2021-02-02 DIAGNOSIS — F329 Major depressive disorder, single episode, unspecified: Secondary | ICD-10-CM | POA: Diagnosis not present

## 2021-02-06 ENCOUNTER — Other Ambulatory Visit: Payer: Self-pay | Admitting: Family Medicine

## 2021-02-06 DIAGNOSIS — K219 Gastro-esophageal reflux disease without esophagitis: Secondary | ICD-10-CM

## 2021-02-26 ENCOUNTER — Ambulatory Visit: Payer: BC Managed Care – PPO

## 2021-02-26 ENCOUNTER — Other Ambulatory Visit: Payer: Self-pay

## 2021-02-26 DIAGNOSIS — I10 Essential (primary) hypertension: Secondary | ICD-10-CM

## 2021-02-26 NOTE — Progress Notes (Signed)
BP Readings from Last 3 Encounters:  01/05/21 (!) 150/90  11/12/20 (!) 157/96  08/20/20 116/72   Patient here for BP check per last visit 01-05-21. Essential hypertension - Plan: lisinopril-hydrochlorothiazide (ZESTORETIC) 20-12.5  BP today is 133 /84  with a pulse of 82  Per Dr. Lorelei Pont continue to take medication and follow up in about 4 to 6 months.

## 2021-02-26 NOTE — Patient Instructions (Signed)
Per Dr. Lorelei Pont continue to take medication and follow up in about 4 to 6 months.

## 2021-02-27 DIAGNOSIS — F411 Generalized anxiety disorder: Secondary | ICD-10-CM | POA: Diagnosis not present

## 2021-02-27 DIAGNOSIS — F339 Major depressive disorder, recurrent, unspecified: Secondary | ICD-10-CM | POA: Diagnosis not present

## 2021-03-03 DIAGNOSIS — F329 Major depressive disorder, single episode, unspecified: Secondary | ICD-10-CM | POA: Diagnosis not present

## 2021-03-13 DIAGNOSIS — F411 Generalized anxiety disorder: Secondary | ICD-10-CM | POA: Diagnosis not present

## 2021-03-13 DIAGNOSIS — F339 Major depressive disorder, recurrent, unspecified: Secondary | ICD-10-CM | POA: Diagnosis not present

## 2021-03-13 DIAGNOSIS — F29 Unspecified psychosis not due to a substance or known physiological condition: Secondary | ICD-10-CM | POA: Diagnosis not present

## 2021-03-14 NOTE — Progress Notes (Deleted)
[facility] at [facility] [address], [place], [place] [address] [phone]  Date:  03/18/2021   Name:  Erica [name]   DOB:  [birth year]   MRN:  [id number]  PCP:  Darreld Mclean, MD    Chief Complaint: No chief complaint on file.   History of Present Illness:  Erica [name] is a 54 y.o. very pleasant female patient who presents with the following:  Pt seen today with concern of a bump on her arm History of HTN, liver hemangioma   She is from [place], has lived in [place] for nearly 36 years  She works for [employer] - going ok No children In her free time she enjoys exercise at the gym    Patient Active Problem List   Diagnosis Date Noted   Chest pain of uncertain etiology 94/85/4627   Liver hemangioma, giant, incidental finding 09/06/2018   Dyspepsia 07/10/2018   Osteopenia 11/26/2016   Vitamin D deficiency 11/26/2016   Essential hypertension 08/04/2006   IBS 08/04/2006    Past Medical History:  Diagnosis Date   Anemia    Depression    Essential hypertension, benign    Fibroids    GERD (gastroesophageal reflux disease)    HH (hiatus hernia)    Hx of adenomatous colonic polyps    IBS (irritable bowel syndrome)    Osteopenia 10/2016   SUI (stress urinary incontinence, female) 09/29/2016    Past Surgical History:  Procedure Laterality Date   COLONOSCOPY  01/01/2005   Normal    POLYPECTOMY     UTERINE FIBROID EMBOLIZATION      Social History   Tobacco Use   Smoking status: Never   Smokeless tobacco: Never  Vaping Use   Vaping Use: Never used  Substance Use Topics   Alcohol use: No    Alcohol/week: 0.0 standard drinks    Comment: occ   Drug use: No    Family History  Problem Relation Age of Onset   Hodgkin's lymphoma Mother    Alzheimer's disease Father    Colon cancer Maternal Grandfather    Stroke Paternal Grandfather    Breast cancer Sister    Breast cancer Maternal Aunt    Esophageal  cancer Neg Hx    Stomach cancer Neg Hx    Rectal cancer Neg Hx     No Known Allergies  Medication list has been reviewed and updated.  Current Outpatient Medications on File Prior to Visit  Medication Sig Dispense Refill   Biotin 10 MG CAPS Take by mouth.     esomeprazole (NEXIUM) 40 MG capsule TAKE 1 CAPSULE BY MOUTH EVERY DAY 90 capsule 1   famotidine (PEPCID) 20 MG tablet Take 1 tablet (20 mg total) by mouth 2 (two) times daily. 180 tablet 1   levocetirizine (XYZAL) 5 MG tablet Take 1 tablet (5 mg total) by mouth every evening. For nasal congestion 30 tablet 11   levocetirizine (XYZAL) 5 MG tablet Take 1 tablet (5 mg total) by mouth every evening. 90 tablet 3   linaclotide (LINZESS) 72 MCG capsule Take 1 capsule (72 mcg total) by mouth daily before breakfast. 30 capsule 11   lisinopril-hydrochlorothiazide (ZESTORETIC) 20-12.5 MG tablet Take 2 tablets by mouth daily. 180 tablet 3   Multiple Vitamins-Minerals (CENTRUM SILVER 50+WOMEN PO) Take by mouth.     No current facility-administered medications on file prior to visit.    Review of Systems:  As per HPI-  otherwise negative.   Physical Examination: There were no vitals filed for this visit. There were no vitals filed for this visit. There is no height or weight on file to calculate BMI. Ideal Body Weight:    GEN: no acute distress. HEENT: Atraumatic, Normocephalic.  Ears and Nose: No external deformity. CV: RRR, No M/G/R. No JVD. No thrill. No extra heart sounds. PULM: CTA B, no wheezes, crackles, rhonchi. No retractions. No resp. distress. No accessory muscle use. ABD: S, NT, ND, +BS. No rebound. No HSM. EXTR: No c/c/e PSYCH: Normally interactive. Conversant.    Assessment and Plan: ***  Signed Lamar Blinks, MD

## 2021-03-18 ENCOUNTER — Ambulatory Visit: Payer: BC Managed Care – PPO | Admitting: Family Medicine

## 2021-03-21 NOTE — Progress Notes (Deleted)
[facility] at [facility] [address] [place], [place] [address] [phone]  Date:  03/26/2021   Name:  Erica [name]   DOB:  1965-10-30   MRN:  [id number]  PCP:  Darreld Mclean, MD    Chief Complaint: No chief complaint on file.   History of Present Illness:  Erica [name] is a 55 y.o. very pleasant female patient who presents with the following:  Pt seen today with concern of a bump on her arm  Last seen by myself 9/22- history of HTN  Patient Active Problem List   Diagnosis Date Noted   Chest pain of uncertain etiology 02/58/5277   Liver hemangioma, giant, incidental finding 09/06/2018   Dyspepsia 07/10/2018   Osteopenia 11/26/2016   Vitamin D deficiency 11/26/2016   Essential hypertension 08/04/2006   IBS 08/04/2006    Past Medical History:  Diagnosis Date   Anemia    Depression    Essential hypertension, benign    Fibroids    GERD (gastroesophageal reflux disease)    HH (hiatus hernia)    Hx of adenomatous colonic polyps    IBS (irritable bowel syndrome)    Osteopenia 10/2016   SUI (stress urinary incontinence, female) 09/29/2016    Past Surgical History:  Procedure Laterality Date   COLONOSCOPY  01/01/2005   Normal    POLYPECTOMY     UTERINE FIBROID EMBOLIZATION      Social History   Tobacco Use   Smoking status: Never   Smokeless tobacco: Never  Vaping Use   Vaping Use: Never used  Substance Use Topics   Alcohol use: No    Alcohol/week: 0.0 standard drinks    Comment: occ   Drug use: No    Family History  Problem Relation Age of Onset   Hodgkin's lymphoma Mother    Alzheimer's disease Father    Colon cancer Maternal Grandfather    Stroke Paternal Grandfather    Breast cancer Sister    Breast cancer Maternal Aunt    Esophageal cancer Neg Hx    Stomach cancer Neg Hx    Rectal cancer Neg Hx     No Known Allergies  Medication list has been reviewed and  updated.  Current Outpatient Medications on File Prior to Visit  Medication Sig Dispense Refill   Biotin 10 MG CAPS Take by mouth.     esomeprazole (NEXIUM) 40 MG capsule TAKE 1 CAPSULE BY MOUTH EVERY DAY 90 capsule 1   famotidine (PEPCID) 20 MG tablet Take 1 tablet (20 mg total) by mouth 2 (two) times daily. 180 tablet 1   levocetirizine (XYZAL) 5 MG tablet Take 1 tablet (5 mg total) by mouth every evening. For nasal congestion 30 tablet 11   levocetirizine (XYZAL) 5 MG tablet Take 1 tablet (5 mg total) by mouth every evening. 90 tablet 3   linaclotide (LINZESS) 72 MCG capsule Take 1 capsule (72 mcg total) by mouth daily before breakfast. 30 capsule 11   lisinopril-hydrochlorothiazide (ZESTORETIC) 20-12.5 MG tablet Take 2 tablets by mouth daily. 180 tablet 3   Multiple Vitamins-Minerals (CENTRUM SILVER 50+WOMEN PO) Take by mouth.     No current facility-administered medications on file prior to visit.    Review of Systems:  As per HPI- otherwise negative.   Physical Examination: There were no vitals filed for this visit. There were no vitals filed for this visit. There is no height or weight on file to calculate  BMI. Ideal Body Weight:    GEN: no acute distress. HEENT: Atraumatic, Normocephalic.  Ears and Nose: No external deformity. CV: RRR, No M/G/R. No JVD. No thrill. No extra heart sounds. PULM: CTA B, no wheezes, crackles, rhonchi. No retractions. No resp. distress. No accessory muscle use. ABD: S, NT, ND, +BS. No rebound. No HSM. EXTR: No c/c/e PSYCH: Normally interactive. Conversant.    Assessment and Plan: ***  Signed Lamar Blinks, MD

## 2021-03-26 ENCOUNTER — Ambulatory Visit: Payer: BC Managed Care – PPO | Admitting: Family Medicine

## 2021-04-15 ENCOUNTER — Telehealth: Payer: Self-pay | Admitting: Family Medicine

## 2021-04-15 NOTE — Telephone Encounter (Signed)
Patient would like to know if she can transfer care to [facility]. Is this ok? Please advice.

## 2021-04-16 NOTE — Telephone Encounter (Signed)
Can you get patient scheduled

## 2021-04-16 NOTE — Telephone Encounter (Signed)
LVM to set up TOC appointment and fasting labs in 2 months.

## 2021-04-24 NOTE — Patient Instructions (Incomplete)
It was good to see you again today.  I would recommend the latest COVID-19 booster if not done already

## 2021-04-24 NOTE — Progress Notes (Deleted)
[facility] [address], [place], [place] [address] [phone]  Date:  04/27/2021   Name:  Erica [name]   DOB:  [birth year]   MRN:  [id number]  PCP:  Erica Mclean, MD    Chief Complaint: No chief complaint on file.   History of Present Illness:  Erica [name] is a 56 y.o. very pleasant female patient who presents with the following:  Patient seen today with concern of a lump in her arm Most recent visit with myself in September History of hypertension, giant liver hemangioma, vitamin D deficiency, IBS, mild prediabetes  At her last visit her blood pressure was elevated, we increased her dose of lisinopril, continue HCTZ   Patient Active Problem List   Diagnosis Date Noted   Chest pain of uncertain etiology 29/52/8413   Liver hemangioma, giant, incidental finding 09/06/2018   Dyspepsia 07/10/2018   Osteopenia 11/26/2016   Vitamin D deficiency 11/26/2016   Essential hypertension 08/04/2006   IBS 08/04/2006    Past Medical History:  Diagnosis Date   Anemia    Depression    Essential hypertension, benign    Fibroids    GERD (gastroesophageal reflux disease)    HH (hiatus hernia)    Hx of adenomatous colonic polyps    IBS (irritable bowel syndrome)    Osteopenia 10/2016   SUI (stress urinary incontinence, female) 09/29/2016    Past Surgical History:  Procedure Laterality Date   COLONOSCOPY  01/01/2005   Normal    POLYPECTOMY     UTERINE FIBROID EMBOLIZATION      Social History   Tobacco Use   Smoking status: Never   Smokeless tobacco: Never  Vaping Use   Vaping Use: Never used  Substance Use Topics   Alcohol use: No    Alcohol/week: 0.0 standard drinks    Comment: occ   Drug use: No    Family History  Problem Relation Age of Onset   Hodgkin's lymphoma Mother    Alzheimer's disease Father    Colon cancer Maternal Grandfather    Stroke Paternal Grandfather     Breast cancer Sister    Breast cancer Maternal Aunt    Esophageal cancer Neg Hx    Stomach cancer Neg Hx    Rectal cancer Neg Hx     No Known Allergies  Medication list has been reviewed and updated.  Current Outpatient Medications on File Prior to Visit  Medication Sig Dispense Refill   Biotin 10 MG CAPS Take by mouth.     esomeprazole (NEXIUM) 40 MG capsule TAKE 1 CAPSULE BY MOUTH EVERY DAY 90 capsule 1   famotidine (PEPCID) 20 MG tablet Take 1 tablet (20 mg total) by mouth 2 (two) times daily. 180 tablet 1   levocetirizine (XYZAL) 5 MG tablet Take 1 tablet (5 mg total) by mouth every evening. For nasal congestion 30 tablet 11   levocetirizine (XYZAL) 5 MG tablet Take 1 tablet (5 mg total) by mouth every evening. 90 tablet 3   linaclotide (LINZESS) 72 MCG capsule Take 1 capsule (72 mcg total) by mouth daily before breakfast. 30 capsule 11   lisinopril-hydrochlorothiazide (ZESTORETIC) 20-12.5 MG tablet Take 2 tablets by mouth daily. 180 tablet 3   Multiple Vitamins-Minerals (CENTRUM SILVER 50+WOMEN PO) Take by mouth.     No current facility-administered medications on file prior to visit.    Review of Systems:  As per HPI- otherwise negative.  Physical Examination: There were no vitals filed for this visit. There were no vitals filed for this visit. There is no height or weight on file to calculate BMI. Ideal Body Weight:    GEN: no acute distress. HEENT: Atraumatic, Normocephalic.  Ears and Nose: No external deformity. CV: RRR, No M/G/R. No JVD. No thrill. No extra heart sounds. PULM: CTA B, no wheezes, crackles, rhonchi. No retractions. No resp. distress. No accessory muscle use. ABD: S, NT, ND, +BS. No rebound. No HSM. EXTR: No c/c/e PSYCH: Normally interactive. Conversant.    Assessment and Plan: ***  Signed Lamar Blinks, MD

## 2021-04-27 ENCOUNTER — Ambulatory Visit: Payer: BC Managed Care – PPO | Admitting: Family Medicine

## 2021-04-29 ENCOUNTER — Ambulatory Visit: Payer: BC Managed Care – PPO | Admitting: Medical

## 2021-04-29 VITALS — BP 132/83 | HR 68 | Temp 98.2°F | Resp 18 | Ht 67.0 in | Wt 158.2 lb

## 2021-04-29 DIAGNOSIS — I10 Essential (primary) hypertension: Secondary | ICD-10-CM | POA: Diagnosis not present

## 2021-04-29 DIAGNOSIS — M6740 Ganglion, unspecified site: Secondary | ICD-10-CM | POA: Diagnosis not present

## 2021-04-29 DIAGNOSIS — M65331 Trigger finger, right middle finger: Secondary | ICD-10-CM

## 2021-04-29 DIAGNOSIS — G5601 Carpal tunnel syndrome, right upper limb: Secondary | ICD-10-CM | POA: Diagnosis not present

## 2021-04-29 DIAGNOSIS — R739 Hyperglycemia, unspecified: Secondary | ICD-10-CM | POA: Diagnosis not present

## 2021-04-29 LAB — COMPREHENSIVE METABOLIC PANEL
ALT: 11 U/L (ref 0–35)
AST: 16 U/L (ref 0–37)
Albumin: 4.3 g/dL (ref 3.5–5.2)
Alkaline Phosphatase: 60 U/L (ref 39–117)
BUN: 19 mg/dL (ref 6–23)
CO2: 29 mEq/L (ref 19–32)
Calcium: 9.8 mg/dL (ref 8.4–10.5)
Chloride: 101 mEq/L (ref 96–112)
Creatinine, Ser: 0.8 mg/dL (ref 0.40–1.20)
GFR: 82.2 mL/min (ref >60.00)
Glucose, Bld: 75 mg/dL (ref 70–99)
Potassium: 3.9 mEq/L (ref 3.5–5.1)
Sodium: 137 mEq/L (ref 135–145)
Total Bilirubin: 0.4 mg/dL (ref 0.2–1.2)
Total Protein: 7.3 g/dL (ref 6.0–8.3)

## 2021-04-29 LAB — HEMOGLOBIN A1C: Hgb A1c MFr Bld: 5.8 % (ref 4.6–6.5)

## 2021-04-29 NOTE — Progress Notes (Signed)
Subjective:    Patient ID: Erica [name], female    DOB: 1966/01/26, 56 y.o.   MRN: [id number]  HPI  Pt in with small lump in her rt forearm area. Pt had small lump in forearm 2 months ago. She states does not hurt at all.   Pt states rt hand pain in middle of the night. Pt ambidextrous. Used rt hand a lot using mouse. Has numb sensation at times when she wakes up. Also has rt hand middle finger trigger. Finger triggered for maybe 5 years.   Htn- pt most current rx in computer 20-12.5(2 tab daily). Pt states her pharmacy gave her 20-25. She has been taking 2 tab daily. Pt after months became aware. She does note some lower leg cramping intermittently.  Pt states her pharmacy now has corrected the dose.    Review of Systems  Constitutional:  Negative for chills, fatigue and fever.  Respiratory:  Negative for cough, chest tightness, shortness of breath and wheezing.   Cardiovascular:  Negative for chest pain and palpitations.  Gastrointestinal:  Negative for abdominal pain and blood in stool.  Genitourinary:  Negative for dyspareunia, enuresis and frequency.  Musculoskeletal:  Negative for back pain, joint swelling, myalgias and neck stiffness.  Skin:  Negative for rash.  Neurological:  Negative for dizziness, seizures, weakness, light-headedness and headaches.  Hematological:  Negative for adenopathy. Does not bruise/bleed easily.  Psychiatric/Behavioral:  Negative for behavioral problems and confusion.     Past Medical History:  Diagnosis Date   Anemia    Depression    Essential hypertension, benign    Fibroids    GERD (gastroesophageal reflux disease)    HH (hiatus hernia)    Hx of adenomatous colonic polyps    IBS (irritable bowel syndrome)    Osteopenia 10/2016   SUI (stress urinary incontinence, female) 09/29/2016     Social History   Socioeconomic History   Marital status: Single    Spouse name: Not on file   Number of children: 0   Years of education:  Not on file   Highest education level: Not on file  Occupational History   Occupation: MI service specialist    Employer: [employer]  Tobacco Use   Smoking status: Never   Smokeless tobacco: Never  Vaping Use   Vaping Use: Never used  Substance and Sexual Activity   Alcohol use: No    Alcohol/week: 0.0 standard drinks    Comment: occ   Drug use: No   Sexual activity: Not Currently    Birth control/protection: Abstinence, Post-menopausal  Other Topics Concern   Not on file  Social History Narrative   Not on file   Social Determinants of Health   Financial Resource Strain: Not on file  Food Insecurity: Not on file  Transportation Needs: Not on file  Physical Activity: Not on file  Stress: Not on file  Social Connections: Not on file  Intimate Partner Violence: Not on file    Past Surgical History:  Procedure Laterality Date   COLONOSCOPY  01/01/2005   Normal    POLYPECTOMY     UTERINE FIBROID EMBOLIZATION      Family History  Problem Relation Age of Onset   Hodgkin's lymphoma Mother    Alzheimer's disease Father    Colon cancer Maternal Grandfather    Stroke Paternal Grandfather    Breast cancer Sister    Breast cancer Maternal Aunt    Esophageal cancer Neg Hx    Stomach cancer  Neg Hx    Rectal cancer Neg Hx     No Known Allergies  Current Outpatient Medications on File Prior to Visit  Medication Sig Dispense Refill   Biotin 10 MG CAPS Take by mouth.     esomeprazole (NEXIUM) 40 MG capsule TAKE 1 CAPSULE BY MOUTH EVERY DAY 90 capsule 1   famotidine (PEPCID) 20 MG tablet Take 1 tablet (20 mg total) by mouth 2 (two) times daily. 180 tablet 1   levocetirizine (XYZAL) 5 MG tablet Take 1 tablet (5 mg total) by mouth every evening. For nasal congestion 30 tablet 11   levocetirizine (XYZAL) 5 MG tablet Take 1 tablet (5 mg total) by mouth every evening. 90 tablet 3   linaclotide (LINZESS) 72 MCG capsule Take 1 capsule (72 mcg total) by mouth daily before  breakfast. 30 capsule 11   lisinopril-hydrochlorothiazide (ZESTORETIC) 20-12.5 MG tablet Take 2 tablets by mouth daily. 180 tablet 3   Multiple Vitamins-Minerals (CENTRUM SILVER 50+WOMEN PO) Take by mouth.     No current facility-administered medications on file prior to visit.    BP 132/83    Pulse 68    Temp 98.2 F (36.8 C)    Resp 18    Ht 5\' 7"  (1.702 m)    Wt 158 lb 3.2 oz (71.8 kg)    LMP  (LMP Unknown)    SpO2 97%    BMI 24.78 kg/m       Objective:   Physical Exam General Mental Status- Alert. General Appearance- Not in acute distress.   Skin General: Color- Normal Color. Moisture- Normal Moisture.  Neck Carotid Arteries- Normal color. Moisture- Normal Moisture. No carotid bruits. No JVD.  Chest and Lung Exam Auscultation: Breath Sounds:-Normal.  Cardiovascular Auscultation:Rythm- Regular. Murmurs & Other Heart Sounds:Auscultation of the heart reveals- No Murmurs.  Abdomen Inspection:-Inspeection Normal. Palpation/Percussion:Note:No mass. Palpation and Percussion of the abdomen reveal- Non Tender, Non Distended + BS, no rebound or guarding.  Neurologic Cranial Nerve exam:- CN III-XII intact(No nystagmus), symmetric smile. Strength:- 5/5 equal and symmetric strength both upper and lower extremities.   Rt forearm- small ganglion cyst like lump. Rt wrist- negative phalens signs.  Rt mid finger- it is triggered.      Assessment & Plan:   Patient Instructions  Hypertension-presently blood pressure well controlled.  On review of your most recent prescription your PCP prescribed lisinopril-HCTZ  20-12.5 to be taken 1 tablet twice a day.  You report that you were given 20-25 and had taken 1 tablet twice a day.  This could account for your muscle cramps.  Will check metabolic panel today to evaluate potassium level.  Please clarify with pharmacy that they give you correct dose.  Mild elevated sugar in the past.  Will check A1c today.  Recent description of probable  carpal tunnel syndrome right upper extremity.  Also you have trigger finger right middle finger as well as likely small ganglion cyst informed.  I went ahead and placed referral to Dr. Erlinda Hong orthopedist to evaluate these conditions.  Follow-up date to be determined after lab review.   Mackie Pai, PA-C

## 2021-04-29 NOTE — Patient Instructions (Addendum)
Hypertension-presently blood pressure well controlled.  On review of your most recent prescription your PCP prescribed lisinopril-HCTZ  20-12.5 to be taken 1 tablet twice a day.  You report that you were given 20-25 and had taken 1 tablet twice a day.  This could account for your muscle cramps.  Will check metabolic panel today to evaluate potassium level.  Please clarify with pharmacy that they give you correct dose.  Mild elevated sugar in the past.  Will check A1c today.  Recent description of probable carpal tunnel syndrome right upper extremity.  Also you have trigger finger right middle finger as well as likely small ganglion cyst informed.  I went ahead and placed referral to Dr. Erlinda Hong orthopedist to evaluate these conditions.  Follow-up date to be determined after lab review.   On further discussion patient is pretty convinced that she has 20-25 mg dose of lisinopril/HCTZ at home.  She states that over the last week she has only been taking 1 tablet daily.  Asked her to verify the dose and send me a picture via MyChart.  If that is the case then advise she could continue once daily dose but get over-the-counter blood pressure cuff and check blood pressure daily over the next 4 days to confirm that blood pressure is adequately controlled.

## 2021-04-30 ENCOUNTER — Encounter: Payer: Self-pay | Admitting: Medical

## 2021-05-08 ENCOUNTER — Encounter: Payer: Self-pay | Admitting: Medical

## 2021-05-08 ENCOUNTER — Telehealth: Payer: Self-pay | Admitting: Medical

## 2021-05-08 ENCOUNTER — Other Ambulatory Visit: Payer: Self-pay | Admitting: Family Medicine

## 2021-05-08 DIAGNOSIS — I10 Essential (primary) hypertension: Secondary | ICD-10-CM

## 2021-05-08 MED ORDER — LISINOPRIL-HYDROCHLOROTHIAZIDE 20-25 MG PO TABS: 1.0000 | ORAL_TABLET | Freq: Every day | ORAL | 0 refills | Status: DC

## 2021-05-08 MED ORDER — LISINOPRIL-HYDROCHLOROTHIAZIDE 20-12.5 MG PO TABS: 2.0000 | ORAL_TABLET | Freq: Every day | ORAL | 3 refills | Status: DC

## 2021-05-08 NOTE — Telephone Encounter (Signed)
Sent in new rx losartan 20/25 1 q day. Looks like staff accidentally sent in wrong dose. See my chart message send to pt.

## 2021-05-08 NOTE — Telephone Encounter (Signed)
Medication sent , just updating you on bp

## 2021-05-12 ENCOUNTER — Other Ambulatory Visit: Payer: Self-pay

## 2021-05-12 ENCOUNTER — Encounter: Payer: Self-pay | Admitting: Orthopaedic Surgery

## 2021-05-12 ENCOUNTER — Ambulatory Visit: Payer: BC Managed Care – PPO | Admitting: Orthopaedic Surgery

## 2021-05-12 DIAGNOSIS — R2231 Localized swelling, mass and lump, right upper limb: Secondary | ICD-10-CM | POA: Diagnosis not present

## 2021-05-12 DIAGNOSIS — M65331 Trigger finger, right middle finger: Secondary | ICD-10-CM

## 2021-05-12 DIAGNOSIS — G5601 Carpal tunnel syndrome, right upper limb: Secondary | ICD-10-CM

## 2021-05-12 MED ORDER — METHYLPREDNISOLONE ACETATE 40 MG/ML IJ SUSP
13.3300 mg | INTRAMUSCULAR | Status: AC | PRN
  Administered 2021-05-12: 13.33 mg

## 2021-05-12 MED ORDER — LIDOCAINE HCL 1 % IJ SOLN
0.3000 mL | INTRAMUSCULAR | Status: AC | PRN
  Administered 2021-05-12: .3 mL

## 2021-05-12 MED ORDER — BUPIVACAINE HCL 0.5 % IJ SOLN
0.3300 mL | INTRAMUSCULAR | Status: AC | PRN
  Administered 2021-05-12: .33 mL

## 2021-05-12 NOTE — Progress Notes (Signed)
Office Visit Note   Patient: Erica [name]           Date of Birth: 05-19-1965           MRN: [id number] Visit Date: 05/12/2021              Requested by: Erica Pai, PA-C [address] [place],  [place] [address] PCP: Erica Mclean, MD   Assessment & Plan: Visit Diagnoses:  1. Right carpal tunnel syndrome   2. Trigger finger, right middle finger   3. Forearm mass, right     Plan: Impression is right carpal tunnel syndrome and right long trigger finger and benign mass of the right radial forearm.  For the carpal tunnel syndrome we will get nerve conduction studies.  She declined a Velcro splint.  We did do cortisone injection for the trigger finger which gave good immediate anesthetic relief.  For the mass we will just monitor this as this is not really overly symptomatic or affecting function.  Follow-up after the nerve conduction studies.  Follow-Up Instructions: No follow-ups on file.   Orders:  Orders Placed This Encounter  Procedures   Hand/UE Inj   Ambulatory referral to Physical Medicine Rehab   No orders of the defined types were placed in this encounter.     Procedures: Hand/UE Inj: R long A1 for trigger finger on 05/12/2021 3:09 PM Indications: pain Details: 25 G needle Medications: 0.3 mL lidocaine 1 %; 0.33 mL bupivacaine 0.5 %; 13.33 mg methylPREDNISolone acetate 40 MG/ML Outcome: tolerated well, no immediate complications Consent was given by the patient. Patient was prepped and draped in the usual sterile fashion.      Clinical Data: No additional findings.   Subjective: Chief Complaint  Patient presents with   Right Hand - Pain    HPI  Erica [name] is a very pleasant 56 year old female here for evaluation of 3 separate issues.  Referral from primary care physician.  First problem is right long trigger finger that she has had for years.  She has come to live with this.  Has not had any treatments for this.  She does work at  a computer all day.  She is also describing numbness and tingling to the right hand and fingertips.  She wakes up with numbness and tingling in the morning and often has to shake out her hand because it feels like it is asleep.  She is also noticed a small mass in the radial forearm.  Review of Systems  Constitutional: Negative.   HENT: Negative.    Eyes: Negative.   Respiratory: Negative.    Cardiovascular: Negative.   Endocrine: Negative.   Musculoskeletal: Negative.   Neurological: Negative.   Hematological: Negative.   Psychiatric/Behavioral: Negative.    All other systems reviewed and are negative.   Objective: Vital Signs: LMP  (LMP Unknown)   Physical Exam Vitals and nursing note reviewed.  Constitutional:      Appearance: She is well-developed.  Pulmonary:     Effort: Pulmonary effort is normal.  Skin:    General: Skin is warm.     Capillary Refill: Capillary refill takes less than 2 seconds.  Neurological:     Mental Status: She is alert and oriented to person, place, and time.  Psychiatric:        Behavior: Behavior normal.        Thought Content: Thought content normal.        Judgment: Judgment normal.  Ortho Exam  Examination of the right upper extremity shows reproducible long trigger finger with tenderness in the palm at the distal flexion palmar crease.  Negative carpal tunnel compressive signs.  She does have a small palpable firm mass in the radial forearm which does not appear aggressive in any way.  Specialty Comments:  No specialty comments available.  Imaging: No results found.   PMFS History: Patient Active Problem List   Diagnosis Date Noted   Chest pain of uncertain etiology 76/54/6503   Liver hemangioma, giant, incidental finding 09/06/2018   Dyspepsia 07/10/2018   Osteopenia 11/26/2016   Vitamin D deficiency 11/26/2016   Essential hypertension 08/04/2006   IBS 08/04/2006   Past Medical History:  Diagnosis Date   Anemia     Depression    Essential hypertension, benign    Fibroids    GERD (gastroesophageal reflux disease)    HH (hiatus hernia)    Hx of adenomatous colonic polyps    IBS (irritable bowel syndrome)    Osteopenia 10/2016   SUI (stress urinary incontinence, female) 09/29/2016    Family History  Problem Relation Age of Onset   Hodgkin's lymphoma Mother    Alzheimer's disease Father    Colon cancer Maternal Grandfather    Stroke Paternal Grandfather    Breast cancer Sister    Breast cancer Maternal Aunt    Esophageal cancer Neg Hx    Stomach cancer Neg Hx    Rectal cancer Neg Hx     Past Surgical History:  Procedure Laterality Date   COLONOSCOPY  01/01/2005   Normal    POLYPECTOMY     UTERINE FIBROID EMBOLIZATION     Social History   Occupational History   Occupation: MI service specialist    Employer: [employer]  Tobacco Use   Smoking status: Never   Smokeless tobacco: Never  Vaping Use   Vaping Use: Never used  Substance and Sexual Activity   Alcohol use: No    Alcohol/week: 0.0 standard drinks    Comment: occ   Drug use: No   Sexual activity: Not Currently    Birth control/protection: Abstinence, Post-menopausal

## 2021-05-26 ENCOUNTER — Encounter: Payer: Self-pay | Admitting: Medical

## 2021-05-27 ENCOUNTER — Encounter: Payer: Self-pay | Admitting: Medical

## 2021-05-27 ENCOUNTER — Ambulatory Visit: Payer: BC Managed Care – PPO | Admitting: Medical

## 2021-05-27 VITALS — BP 114/70 | HR 73 | Temp 97.8°F | Resp 16 | Ht 67.0 in | Wt 157.2 lb

## 2021-05-27 DIAGNOSIS — R739 Hyperglycemia, unspecified: Secondary | ICD-10-CM

## 2021-05-27 DIAGNOSIS — I10 Essential (primary) hypertension: Secondary | ICD-10-CM

## 2021-05-27 DIAGNOSIS — R5383 Other fatigue: Secondary | ICD-10-CM

## 2021-05-27 NOTE — Telephone Encounter (Signed)
Triage advised pt to come in today , she is placed on your schedule for pm

## 2021-05-27 NOTE — Telephone Encounter (Signed)
Pt stated her bp today was 97/65 and she feels very tired. Transferred pt to triage.

## 2021-05-27 NOTE — Telephone Encounter (Signed)
Nurse Assessment Nurse: Thad Ranger RN, [facility] Date/Time (Eastern Time): 05/27/2021 10:30:48 AM Confirm and document reason for call. If symptomatic, describe symptoms. ---Blood pressure readings have been low, most recent is 97/65 this am and has not taken her blood pressure medication this morning. Also feeling very tired and fatigued. Does the patient have any new or worsening symptoms? ---Yes Will a triage be completed? ---Yes Related visit to physician within the last 2 weeks? ---No Does the PT have any chronic conditions? (i.e. diabetes, asthma, this includes High risk factors for pregnancy, etc.) ---No Is this a behavioral health or substance abuse call? ---No Guidelines Guideline Title Affirmed Question Affirmed Notes Nurse Date/Time (Eastern Time) Blood Pressure - Low [8] Systolic BP 78-676 AND [7] taking blood pressure medications AND [3] dizzy, lightheaded or weak Carmon, RN, [facility] 05/27/2021 10:32:43 AM PLEASE NOTE: All timestamps contained within this report are represented as [place] Standard Time. CONFIDENTIALTY NOTICE: This fax transmission is intended only for the addressee. It contains information that is legally privileged, confidential or otherwise protected from use or disclosure. If you are not the intended recipient, you are strictly prohibited from reviewing, disclosing, copying using or disseminating any of this information or taking any action in reliance on or regarding this information. If you have received this fax in error, please notify us immediately by telephone so that we can arrange for its return to [place]. Phone: [phone], Toll-Free: [phone], Fax: [phone] Page: 2 of 2 Call Id: [id number] [place]. Time Eilene Ghazi Time) Disposition Final User 05/27/2021 10:00:11 AM Attempt made - message left Jackqulyn Livings 05/27/2021 10:39:40 AM Call Completed Thad Ranger, RN, [facility] 05/27/2021 10:35:58 AM See HCP within 4 Hours (or PCP triage) Yes Carmon, RN,  Yevette Edwards Disagree/Comply Comply Caller Understands Yes PreDisposition Call Doctor Care Advice Given Per Guideline SEE HCP (OR PCP TRIAGE) WITHIN 4 HOURS: CALL BACK IF: * You become worse CARE ADVICE given per Low Blood Pressure (Adult) guideline. Comments User: Romeo Apple, RN Date/Time Eilene Ghazi Time): 05/27/2021 10:39:27 AM Called [facility] for appt and WT to office. Referrals REFERRED TO PCP OFFICE

## 2021-05-27 NOTE — Progress Notes (Signed)
Subjective:    Patient ID: Erica [name], female    DB: May 02, 1965, 56 y.o.   MRM: [id number]  HPI  Pt in for blood pressure check.    Reading today in our office by MA was 112/68.  05/26/21 - 119/84 05/25/21 - 111/84 05/24/21 - 101/65 05/23/21 - 102/76  Pt BP this morning was 97/65.  Pt started to feel tired yesterday.   Pt is eating a lot less sugar. And for brief time reduced her salt intake. Eating more fish recently.  Pt had used bp med lisinopril 20/25 1 tab po q day.   Pt states her weight is 151 at home.  Pt feels like hydrating adequatley.  Review of Systems  Constitutional:  Negative for chills, fatigue and fever.  Respiratory:  Negative for cough, chest tightness, shortness of breath and wheezing.   Cardiovascular:  Negative for chest pain and palpitations.  Gastrointestinal:  Negative for abdominal pain.  Genitourinary:  Negative for dyspareunia and dysuria.  Musculoskeletal:  Negative for back pain.  Skin:  Negative for rash.  Neurological:  Negative for dizziness, light-headedness and headaches.  Hematological:  Negative for adenopathy. Does not bruise/bleed easily.  Psychiatric/Behavioral:  Negative for behavioral problems and dysphoric mood. The patient is not nervous/anxious and is not hyperactive.     Past Medical History:  Diagnosis Date   Anemia    Depression    Essential hypertension, benign    Fibroids    GERD (gastroesophageal reflux disease)    HH (hiatus hernia)    Hx of adenomatous colonic polyps    IBS (irritable bowel syndrome)    Osteopenia 10/2016   SUI (stress urinary incontinence, female) 09/29/2016     Social History   Socioeconomic History   Marital status: Single    Spouse name: Not on file   Number of children: 0   Years of education: Not on file   Highest education level: Not on file  Occupational History   Occupation: MI service specialist    Employer: [employer]  Tobacco Use   Smoking status: Never    Smokeless tobacco: Never  Vaping Use   Vaping Use: Never used  Substance and Sexual Activity   Alcohol use: No    Alcohol/week: 0.0 standard drinks    Comment: occ   Drug use: No   Sexual activity: Not Currently    Birth control/protection: Abstinence, Post-menopausal  Other Topics Concern   Not on file  Social History Narrative   Not on file   Social Determinants of Health   Financial Resource Strain: Not on file  Food Insecurity: Not on file  Transportation Needs: Not on file  Physical Activity: Not on file  Stress: Not on file  Social Connections: Not on file  Intimate Partner Violence: Not on file    Past Surgical History:  Procedure Laterality Date   COLONOSCOPY  01/01/2005   Normal    POLYPECTOMY     UTERINE FIBROID EMBOLIZATION      Family History  Problem Relation Age of Onset   Hodgkin's lymphoma Mother    Alzheimer's disease Father    Colon cancer Maternal Grandfather    Stroke Paternal Grandfather    Breast cancer Sister    Breast cancer Maternal Aunt    Esophageal cancer Neg Hx    Stomach cancer Neg Hx    Rectal cancer Neg Hx     No Known Allergies  Current Outpatient Medications on File Prior to Visit  Medication Sig  Dispense Refill   Biotin 10 MG CAPS Take by mouth.     esomeprazole (NEXIUM) 40 MG capsule TAKE 1 CAPSULE BY MOUTH EVERY DAY 90 capsule 1   famotidine (PEPCID) 20 MG tablet Take 1 tablet (20 mg total) by mouth 2 (two) times daily. 180 tablet 1   levocetirizine (XYZAL) 5 MG tablet Take 1 tablet (5 mg total) by mouth every evening. For nasal congestion 30 tablet 11   levocetirizine (XYZAL) 5 MG tablet Take 1 tablet (5 mg total) by mouth every evening. 90 tablet 3   linaclotide (LINZESS) 72 MCG capsule Take 1 capsule (72 mcg total) by mouth daily before breakfast. 30 capsule 11   lisinopril-hydrochlorothiazide (ZESTORETIC) 20-25 MG tablet Take 1 tablet by mouth daily. 30 tablet 0   Multiple Vitamins-Minerals (CENTRUM SILVER 50+WOMEN  PO) Take by mouth.     No current facility-administered medications on file prior to visit.    BP 112/68    Pulse 73    Temp 97.8 F (36.6 C)    Resp 16    Ht 5\' 7"  (1.702 m)    Wt 157 lb 3.2 oz (71.3 kg)    LMP  (LMP Unknown)    SpO2 99%    BMI 24.62 kg/m       Objective:   Physical Exam  General Mental Status- Alert. General Appearance- Not in acute distress.   Skin General: Color- Normal Color. Moisture- Normal Moisture.  Neck Carotid Arteries- Normal color. Moisture- Normal Moisture. No carotid bruits. No JVD.  Chest and Lung Exam Auscultation: Breath Sounds:-Normal.  Cardiovascular Auscultation:Rythm- Regular. Murmurs & Other Heart Sounds:Auscultation of the heart reveals- No Murmurs.   Neurologic Cranial Nerve exam:- CN III-XII intact(No nystagmus), symmetric smile. Drift Test:- No drift. Romberg Exam:- Negative.  Heal to Toe Gait exam:-Normal. Finger to Nose:- Normal/Intact Strength:- 5/5 equal and symmetric strength both upper and lower extremities.       Assessment & Plan:   Patient Instructions  Htn dx but recent bp on very low side despite not being on medication this morning. Stay off bp med presently but check bp daily. If you see rise above 140/90 let me know. Want to see your trend over next 4 days and then decide on possibly putting just back on lisinopril daily and not diuretic.  Labs for fatigue on Friday 2:15. Will check for any dehydration and check blood volume.  For elevated sugar can get relion glucose to check fasting and occasional post.  Follow up date to be determined after lab and bp update.

## 2021-05-27 NOTE — Telephone Encounter (Signed)
[facility] doesn't have anything until weds & triage didn't document a note

## 2021-05-27 NOTE — Patient Instructions (Addendum)
Htn dx but recent bp on very low side despite not being on medication this morning. Stay off bp med presently but check bp daily. If you see rise above 140/90 let me know. Want to see your trend over next 4 days and then decide on possibly putting just back on lisinopril daily and not diuretic.  Labs for fatigue on Friday 2:15. Will check for any dehydration and check blood volume.  For elevated sugar can get relion glucose to check fasting and occasional post.  Follow up date to be determined after lab and bp update.

## 2021-05-29 ENCOUNTER — Other Ambulatory Visit (INDEPENDENT_AMBULATORY_CARE_PROVIDER_SITE_OTHER): Payer: BC Managed Care – PPO

## 2021-05-29 DIAGNOSIS — R5383 Other fatigue: Secondary | ICD-10-CM | POA: Diagnosis not present

## 2021-05-29 NOTE — Addendum Note (Signed)
Addended by: Legrand Rams on: 05/29/2021 02:15 PM   Modules accepted: Orders

## 2021-05-30 LAB — COMPREHENSIVE METABOLIC PANEL
AG Ratio: 1.4 (calc) (ref 1.0–2.5)
ALT: 10 U/L (ref 6–29)
AST: 17 U/L (ref 10–35)
Albumin: 4.1 g/dL (ref 3.6–5.1)
Alkaline phosphatase (APISO): 66 U/L (ref 37–153)
BUN: 19 mg/dL (ref 7–25)
CO2: 24 mmol/L (ref 20–32)
Calcium: 9.7 mg/dL (ref 8.6–10.4)
Chloride: 105 mmol/L (ref 98–110)
Creat: 0.85 mg/dL (ref 0.50–1.03)
Globulin: 3 g/dL (calc) (ref 1.9–3.7)
Glucose, Bld: 101 mg/dL — ABNORMAL HIGH (ref 65–99)
Potassium: 3.9 mmol/L (ref 3.5–5.3)
Sodium: 138 mmol/L (ref 135–146)
Total Bilirubin: 0.3 mg/dL (ref 0.2–1.2)
Total Protein: 7.1 g/dL (ref 6.1–8.1)

## 2021-05-30 LAB — CBC WITH DIFFERENTIAL/PLATELET
Absolute Monocytes: 308 cells/uL (ref 200–950)
Basophils Absolute: 18 cells/uL (ref 0–200)
Basophils Relative: 0.4 %
Eosinophils Absolute: 51 cells/uL (ref 15–500)
Eosinophils Relative: 1.1 %
HCT: 37.7 % (ref 35.0–45.0)
Hemoglobin: 12.3 g/dL (ref 11.7–15.5)
Lymphs Abs: 1996 cells/uL (ref 850–3900)
MCH: 29.4 pg (ref 27.0–33.0)
MCHC: 32.6 g/dL (ref 32.0–36.0)
MCV: 90 fL (ref 80.0–100.0)
MPV: 12.7 fL — ABNORMAL HIGH (ref 7.5–12.5)
Monocytes Relative: 6.7 %
Neutro Abs: 2226 cells/uL (ref 1500–7800)
Neutrophils Relative %: 48.4 %
Platelets: 166 10*3/uL (ref 140–400)
RBC: 4.19 10*6/uL (ref 3.80–5.10)
RDW: 12.1 % (ref 11.0–15.0)
Total Lymphocyte: 43.4 %
WBC: 4.6 10*3/uL (ref 3.8–10.8)

## 2021-05-30 LAB — VITAMIN B12: Vitamin B-12: 657 pg/mL (ref 200–1100)

## 2021-05-30 LAB — T4, FREE: Free T4: 1.3 ng/dL (ref 0.8–1.8)

## 2021-05-30 LAB — TSH: TSH: 0.63 mIU/L (ref 0.40–4.50)

## 2021-06-01 ENCOUNTER — Encounter: Payer: Self-pay | Admitting: Medical

## 2021-06-29 ENCOUNTER — Encounter: Payer: Self-pay | Admitting: Medical

## 2021-06-29 ENCOUNTER — Ambulatory Visit: Payer: BC Managed Care – PPO | Admitting: Medical

## 2021-06-29 VITALS — BP 145/85 | HR 69 | Resp 18 | Ht 67.0 in | Wt 159.8 lb

## 2021-06-29 DIAGNOSIS — R739 Hyperglycemia, unspecified: Secondary | ICD-10-CM

## 2021-06-29 DIAGNOSIS — R030 Elevated blood-pressure reading, without diagnosis of hypertension: Secondary | ICD-10-CM

## 2021-06-29 NOTE — Progress Notes (Signed)
? ?Subjective:  ? ? Patient ID: Erica [name], female    DOB: [birth year], 56 y.o.   MRN: [id number] ? ?HPI ? ?Pt has htn. ? ? ?Pt has her machine with her today and she got 144/97 and 154/97. When CMA checked got 150/80. Pt went to dentist and eye MD today. Neither checked her blood pressure. ? ?On last visit pt bp were 05/26/21 - 119/84 ?05/25/21 - 111/84 ?05/24/21 - 101/65 ?05/23/21 - 102/76 ? ?Pt bp on weekend was 122/83 and 127/87. At home. Other reading 121/82.  ? ?I got 150/80 today she is not on any bp meds. ? ?Pt is eating a lot of nuts recenlty. Tryin to avoid salty and too sweet foods. ? ?Jerrye Bushy much improved with eating better.  ? ?Elevated sugar and attemping to eat low sugar diet. ? ?Review of Systems  ?Constitutional:  Negative for chills, fatigue and fever.  ?Respiratory:  Negative for cough, chest tightness, shortness of breath and wheezing.   ?Cardiovascular:  Negative for chest pain and palpitations.  ?Gastrointestinal:  Negative for abdominal pain.  ?Genitourinary:  Negative for dysuria and enuresis.  ?Musculoskeletal:  Negative for back pain.  ?Neurological:  Negative for dizziness and headaches.  ?Hematological:  Negative for adenopathy. Does not bruise/bleed easily.  ?Psychiatric/Behavioral:  Negative for behavioral problems, confusion and decreased concentration.   ? ? ?Past Medical History:  ?Diagnosis Date  ? Anemia   ? Depression   ? Essential hypertension, benign   ? Fibroids   ? GERD (gastroesophageal reflux disease)   ? HH (hiatus hernia)   ? Hx of adenomatous colonic polyps   ? IBS (irritable bowel syndrome)   ? Osteopenia 10/2016  ? SUI (stress urinary incontinence, female) 09/29/2016  ? ?  ?Social History  ? ?Socioeconomic History  ? Marital status: Single  ?  Spouse name: Not on file  ? Number of children: 0  ? Years of education: Not on file  ? Highest education level: Not on file  ?Occupational History  ? Occupation: MI service specialist  ?  Employer: [employer]  ?Tobacco  Use  ? Smoking status: Never  ? Smokeless tobacco: Never  ?Vaping Use  ? Vaping Use: Never used  ?Substance and Sexual Activity  ? Alcohol use: No  ?  Alcohol/week: 0.0 standard drinks  ?  Comment: occ  ? Drug use: No  ? Sexual activity: Not Currently  ?  Birth control/protection: Abstinence, Post-menopausal  ?Other Topics Concern  ? Not on file  ?Social History Narrative  ? Not on file  ? ?Social Determinants of Health  ? ?Financial Resource Strain: Not on file  ?Food Insecurity: Not on file  ?Transportation Needs: Not on file  ?Physical Activity: Not on file  ?Stress: Not on file  ?Social Connections: Not on file  ?Intimate Partner Violence: Not on file  ? ? ?Past Surgical History:  ?Procedure Laterality Date  ? COLONOSCOPY  01/01/2005  ? Normal   ? POLYPECTOMY    ? UTERINE FIBROID EMBOLIZATION    ? ? ?Family History  ?Problem Relation Age of Onset  ? Hodgkin's lymphoma Mother   ? Alzheimer's disease Father   ? Colon cancer Maternal Grandfather   ? Stroke Paternal Grandfather   ? Breast cancer Sister   ? Breast cancer Maternal Aunt   ? Esophageal cancer Neg Hx   ? Stomach cancer Neg Hx   ? Rectal cancer Neg Hx   ? ? ?No Known Allergies ? ?Current Outpatient Medications on  File Prior to Visit  ?Medication Sig Dispense Refill  ? Biotin 10 MG CAPS Take by mouth.    ? esomeprazole (NEXIUM) 40 MG capsule TAKE 1 CAPSULE BY MOUTH EVERY DAY 90 capsule 1  ? famotidine (PEPCID) 20 MG tablet Take 1 tablet (20 mg total) by mouth 2 (two) times daily. 180 tablet 1  ? levocetirizine (XYZAL) 5 MG tablet Take 1 tablet (5 mg total) by mouth every evening. For nasal congestion 30 tablet 11  ? levocetirizine (XYZAL) 5 MG tablet Take 1 tablet (5 mg total) by mouth every evening. 90 tablet 3  ? linaclotide (LINZESS) 72 MCG capsule Take 1 capsule (72 mcg total) by mouth daily before breakfast. 30 capsule 11  ? lisinopril-hydrochlorothiazide (ZESTORETIC) 20-25 MG tablet Take 1 tablet by mouth daily. 30 tablet 0  ? Multiple  Vitamins-Minerals (CENTRUM SILVER 50+WOMEN PO) Take by mouth.    ? ?No current facility-administered medications on file prior to visit.  ? ? ?BP (!) 150/80   Pulse 69   Resp 18   Ht '5\' 7"'$  (1.702 m)   Wt 159 lb 12.8 oz (72.5 kg)   LMP  (LMP Unknown)   SpO2 100%   BMI 25.03 kg/m?  ?  ?   ?Objective:  ? Physical Exam ? ?General ?Mental Status- Alert. General Appearance- Not in acute distress.  ? ?Skin ?General: Color- Normal Color. Moisture- Normal Moisture. ? ?Neck ?Carotid Arteries- Normal color. Moisture- Normal Moisture. No carotid bruits. No JVD. ? ?Chest and Lung Exam ?Auscultation: ?Breath Sounds:-Normal. ? ?Cardiovascular ?Auscultation:Rythm- Regular. ?Murmurs & Other Heart Sounds:Auscultation of the heart reveals- No Murmurs. ? ?Abdomen ?Inspection:-Inspeection Normal. ?Palpation/Percussion:Note:No mass. Palpation and Percussion of the abdomen reveal- Non Tender, Non Distended + BS, no rebound or guarding. ? ?Neurologic ?Cranial Nerve exam:- CN III-XII intact(No nystagmus), symmetric smile. ?Strength:- 5/5 equal and symmetric strength both upper and lower extremities.  ? ? ?   ?Assessment & Plan:  ? ?Patient Instructions  ?Your blood pressure is high here by your machine and when I checked manually. However your bp is less at home.  This leads me to think you have white coat htn. Check bp daily at home. If bp stays in range of 130/80 or less recommend continuing to stay off bp meds.  ? ? ?Check bp daily and update me on readings. ? ?If bp high again as before would put you back on combination on former med but possible at reduced dose. ? ?To help control weight/loss 5 lb could try ww app. ? ?For elevated sugar I do think ww diet would be practical. ? ?Jerrye Bushy well controlled with pepcid and healthy diet. ? ? ?Follow up date based on bp update in 10 days. ? ? ? ? ?  ?Mackie Pai, PA-C  ? ? ? ?Time spent with patient today was 30  minutes which consisted of chart review, discussing diagnosis of white  coat htn presently and  documentation. Answering pt questions.  ?

## 2021-06-29 NOTE — Patient Instructions (Addendum)
Your blood pressure is high here by your machine and when I checked manually. However your bp is less at home.  This leads me to think you have white coat htn. Check bp daily at home. If bp stays in range of 130/80 or less recommend continuing to stay off bp meds.  ? ? ?Check bp daily and update me on readings. ? ?If bp high again as before would put you back on combination on former med but possible at reduced dose. ? ?To help control weight/loss 5 lb could try ww app. ? ?For elevated sugar I do think ww diet would be practical. ? ?Jerrye Bushy well controlled with pepcid and healthy diet. ? ? ?Follow up date based on bp update in 10 days. ? ? ? ? ? ?

## 2021-07-11 ENCOUNTER — Other Ambulatory Visit: Payer: Self-pay | Admitting: Family Medicine

## 2021-07-11 DIAGNOSIS — K219 Gastro-esophageal reflux disease without esophagitis: Secondary | ICD-10-CM

## 2021-07-15 ENCOUNTER — Telehealth: Payer: Self-pay | Admitting: Medical

## 2021-07-15 NOTE — Telephone Encounter (Signed)
Pt called and she stated she doesn't want to disclose issues because she is at work , no other appointments in office for today and then pt didn't want to go to another location , will come in tomorrow  ?

## 2021-07-15 NOTE — Telephone Encounter (Signed)
Pt states she is having gi issues. Her gi dr was unable to see her this week. She stated she felt it was an emergency, but did not want to speak with triage. She stated she was at work and did not feel comfortable going into detail.  ?

## 2021-07-15 NOTE — Telephone Encounter (Signed)
Pt called and lvm to return call 

## 2021-07-16 ENCOUNTER — Ambulatory Visit
Admission: RE | Admit: 2021-07-16 | Discharge: 2021-07-16 | Disposition: A | Discharge status: Home / Self Care | Payer: BC Managed Care – PPO | Source: Ambulatory Visit | Attending: Medical | Admitting: Medical

## 2021-07-16 ENCOUNTER — Ambulatory Visit: Payer: BC Managed Care – PPO | Admitting: Medical

## 2021-07-16 ENCOUNTER — Other Ambulatory Visit: Payer: Self-pay | Admitting: Medical

## 2021-07-16 VITALS — BP 140/90 | HR 60 | Temp 98.3°F | Resp 18 | Ht 67.0 in | Wt 156.4 lb

## 2021-07-16 DIAGNOSIS — K649 Unspecified hemorrhoids: Secondary | ICD-10-CM

## 2021-07-16 DIAGNOSIS — D259 Leiomyoma of uterus, unspecified: Secondary | ICD-10-CM | POA: Diagnosis not present

## 2021-07-16 DIAGNOSIS — K5909 Other constipation: Secondary | ICD-10-CM

## 2021-07-16 DIAGNOSIS — R1900 Intra-abdominal and pelvic swelling, mass and lump, unspecified site: Secondary | ICD-10-CM | POA: Diagnosis not present

## 2021-07-16 DIAGNOSIS — K5900 Constipation, unspecified: Secondary | ICD-10-CM | POA: Diagnosis not present

## 2021-07-16 MED ORDER — LISINOPRIL 10 MG PO TABS
10.0000 mg | ORAL_TABLET | Freq: Every day | ORAL | 3 refills | Status: DC
Start: 2021-07-16 — End: 2021-07-31

## 2021-07-16 MED ORDER — CHLORTHALIDONE 15 MG PO TABS: 15.0000 mg | ORAL_TABLET | Freq: Every day | ORAL | 3 refills | Status: DC

## 2021-07-16 MED ORDER — HYDROCORTISONE ACETATE 25 MG RE SUPP
25.0000 mg | Freq: Two times a day (BID) | RECTAL | 0 refills | Status: DC
Start: 2021-07-16 — End: 2021-09-24

## 2021-07-16 MED ORDER — CHLORTHALIDONE 25 MG PO TABS: 25.0000 mg | ORAL_TABLET | Freq: Every day | ORAL | 3 refills | Status: DC

## 2021-07-16 NOTE — Progress Notes (Addendum)
? ?Subjective:  ? ? Patient ID: Erica [name], female    DOB: 1966-01-14, 56 y.o.   MRN: [id number] ? ?HPI ? ?Pt in with rectal for few weeks. Pt states she can feel secretion from rectum at time. Pt thought at one point secretion had bad small smell. Pt states applied cortisone to area and felt burning.  ? ?Pt states she is always constipated. Recently more constipated.  ? ?No fever, no chills or sweats.  ? ?Pt had normal colonosocpy in 2020.  ? ?Pt states last normal bowel movement yesterday. But prior to that was 2 days before.  ? ?Pt state yesterday bm came out easily. But usually has to strain some.  ? ? ?Pt tried to see [facility]. Pt states sees Dr. Jacinto Reap. Pt given appointment in May. She not sure if she took that appointment. ? ?Pt bp at home have been 137/90, 130/86, 142/91, 145/86, 150/94, 150/90, 140/84, 141/88, 134/83, 159/98, and 147/90. ? ?Previously levels were very low, exercising and eating less salt. ? ? ?Review of Systems  ?Constitutional:  Negative for chills and fever.  ?Respiratory:  Negative for cough, chest tightness, shortness of breath and wheezing.   ?Cardiovascular:  Negative for chest pain and palpitations.  ?Gastrointestinal:  Positive for constipation. Negative for abdominal pain, blood in stool, diarrhea, nausea, rectal pain and vomiting.  ?Genitourinary:  Negative for difficulty urinating, dysuria, enuresis, frequency and genital sores.  ?Musculoskeletal:  Negative for back pain and neck pain.  ?Skin:  Negative for rash.  ? ? ?Past Medical History:  ?Diagnosis Date  ? Anemia   ? Depression   ? Essential hypertension, benign   ? Fibroids   ? GERD (gastroesophageal reflux disease)   ? HH (hiatus hernia)   ? Hx of adenomatous colonic polyps   ? IBS (irritable bowel syndrome)   ? Osteopenia 10/2016  ? SUI (stress urinary incontinence, female) 09/29/2016  ? ?  ?Social History  ? ?Socioeconomic History  ? Marital status: Single  ?  Spouse name: Not on file  ? Number of children: 0  ? Years  of education: Not on file  ? Highest education level: Not on file  ?Occupational History  ? Occupation: MI service specialist  ?  Employer: [employer]  ?Tobacco Use  ? Smoking status: Never  ? Smokeless tobacco: Never  ?Vaping Use  ? Vaping Use: Never used  ?Substance and Sexual Activity  ? Alcohol use: No  ?  Alcohol/week: 0.0 standard drinks  ?  Comment: occ  ? Drug use: No  ? Sexual activity: Not Currently  ?  Birth control/protection: Abstinence, Post-menopausal  ?Other Topics Concern  ? Not on file  ?Social History Narrative  ? Not on file  ? ?Social Determinants of Health  ? ?Financial Resource Strain: Not on file  ?Food Insecurity: Not on file  ?Transportation Needs: Not on file  ?Physical Activity: Not on file  ?Stress: Not on file  ?Social Connections: Not on file  ?Intimate Partner Violence: Not on file  ? ? ?Past Surgical History:  ?Procedure Laterality Date  ? COLONOSCOPY  01/01/2005  ? Normal   ? POLYPECTOMY    ? UTERINE FIBROID EMBOLIZATION    ? ? ?Family History  ?Problem Relation Age of Onset  ? Hodgkin's lymphoma Mother   ? Alzheimer's disease Father   ? Colon cancer Maternal Grandfather   ? Stroke Paternal Grandfather   ? Breast cancer Sister   ? Breast cancer Maternal Aunt   ?  Esophageal cancer Neg Hx   ? Stomach cancer Neg Hx   ? Rectal cancer Neg Hx   ? ? ?No Known Allergies ? ?Current Outpatient Medications on File Prior to Visit  ?Medication Sig Dispense Refill  ? Biotin 10 MG CAPS Take by mouth.    ? esomeprazole (NEXIUM) 40 MG capsule TAKE 1 CAPSULE BY MOUTH EVERY DAY 90 capsule 1  ? famotidine (PEPCID) 20 MG tablet TAKE 1 TABLET BY MOUTH TWICE A DAY 180 tablet 1  ? levocetirizine (XYZAL) 5 MG tablet Take 1 tablet (5 mg total) by mouth every evening. For nasal congestion 30 tablet 11  ? levocetirizine (XYZAL) 5 MG tablet Take 1 tablet (5 mg total) by mouth every evening. 90 tablet 3  ? linaclotide (LINZESS) 72 MCG capsule Take 1 capsule (72 mcg total) by mouth daily before breakfast. 30  capsule 11  ? lisinopril-hydrochlorothiazide (ZESTORETIC) 20-25 MG tablet Take 1 tablet by mouth daily. 30 tablet 0  ? Multiple Vitamins-Minerals (CENTRUM SILVER 50+WOMEN PO) Take by mouth.    ? ?No current facility-administered medications on file prior to visit.  ? ? ?BP 140/90   Pulse 60   Temp 98.3 ?F (36.8 ?C)   Resp 18   Ht '5\' 7"'$  (1.702 m)   Wt 156 lb 6.4 oz (70.9 kg)   LMP  (LMP Unknown)   SpO2 100%   BMI 24.50 kg/m?  ?  ? ? ?   ?Objective:  ? Physical Exam ? ?General ?Mental Status- Alert. General Appearance- Not in acute distress.  ? ?Skin ?General: Color- Normal Color. Moisture- Normal Moisture. ? ? ?Chest and Lung Exam ?Auscultation: ?Breath Sounds:-Normal. ? ?Cardiovascular ?Auscultation:Rythm- Regular. ?Murmurs & Other Heart Sounds:Auscultation of the heart reveals- No Murmurs. ? ?Abdomen ?Inspection:-Inspeection Normal. ?Palpation/Percussion:Note:No mass. Palpation and Percussion of the abdomen reveal- Non Tender, Non Distended + BS, no rebound or guarding. ? ? ?Neurologic ?Cranial Nerve exam:- CN III-XII intact(No nystagmus), symmetric smile. ?Strength:- 5/5 equal and symmetric strength both upper and lower extremities.  ? ?Rectal- on inspection I don't see hemorrhoid, no d]fissure, no redness or swelling. No dc seen. On palpation no tenderness perirectall. ?   ?Assessment & Plan:  ? ?Patient Instructions  ?Discussed benefit of doing visual rectal exam with chaperone. Verbal consent given. Jarrett Soho present during exam. ? ?On exam no obvious external hemorrhoids but  early small external hemorrhoid possibility.  Also possible internal hemorrhoids.  Will prescribe Anusol HC suppository.  Recommend that she stay well-hydrated and avoid constipation. ? ?You do report some recent constipation and described seepage type drainage to smells.  This can occur if you have a significant amount of hard stool present.  I will go ahead and get 1 view abdomen x-ray today to assess stool burden.  After review he  has constipation/stool burden seen might recommend Dulcolax, MiraLAX or for severe stool burden magnesium citrate. ? ?If signs and symptoms worsening despite above measures will try to expedite your GI appointment.  I do think is a good idea for you to call your GI office back and confirm that you do have appointment in May. ? ? ?Htn- bp has increased since last visit when bp med stopped. Will start you back on lisinopril 10 mg daily with chlorthalidone 15 mg daily(let me know if your pharmacy does not carry this dose of chlorthalidone). ? ?Follow-up date to be determined after x-ray review and after response to Anusol suppositories.  ? ? ?Time spent with patient today was  40 minutes  which consisted of chart rediew, discussing diagnosis, work up, treatment, coordinating xray locatoin since pt states facitlity fee her too high and documentation.  ? ?Mackie Pai, PA-C  ?

## 2021-07-16 NOTE — Addendum Note (Signed)
Addended by: Anabel Halon on: 07/16/2021 06:54 PM ? ? Modules accepted: Orders ? ?

## 2021-07-16 NOTE — Addendum Note (Signed)
Addended by: Anabel Halon on: 07/16/2021 11:46 AM ? ? Modules accepted: Orders, Level of Service ? ?

## 2021-07-16 NOTE — Patient Instructions (Addendum)
Discussed benefit of doing visual rectal exam with chaperone. Verbal consent given. Jarrett Soho present during exam. ? ?On exam no obvious external hemorrhoids but  early small external hemorrhoid possibility.  Also possible internal hemorrhoids.  Will prescribe Anusol HC suppository.  Recommend that she stay well-hydrated and avoid constipation. ? ?You do report some recent constipation and described seepage type drainage to smells.  This can occur if you have a significant amount of hard stool present.  I will go ahead and get 1 view abdomen x-ray today to assess stool burden.  After review he has constipation/stool burden seen might recommend Dulcolax, MiraLAX or for severe stool burden magnesium citrate. ? ?If signs and symptoms worsening despite above measures will try to expedite your GI appointment.  I do think is a good idea for you to call your GI office back and confirm that you do have appointment in May. ? ? ?Htn- bp has increased since last visit when bp med stopped. Will start you back on lisinopril 10 mg daily with chlorthalidone 15 mg daily(let me know if your pharmacy does not carry this dose of chlorthalidone). ? ?Follow-up date to be determined after x-ray review and after response to Anusol suppositories. ?

## 2021-07-16 NOTE — Telephone Encounter (Signed)
Pharmacy note : ? ? ?'15MG'$  NOT COVERED, '15MG'$  ONLY COMES IN BRAND NAME ? ? ?All Pharmacy Suggested Alternatives:   ?indapamide (LOZOL) 2.5 MG tablet ?chlorthalidone (HYGROTON) 25 MG tablet ?metolazone (ZAROXOLYN) 5 MG tablet  ? ?

## 2021-07-24 ENCOUNTER — Encounter: Payer: Self-pay | Admitting: Medical

## 2021-07-26 NOTE — Addendum Note (Signed)
Addended by: Anabel Halon on: 07/26/2021 09:05 PM ? ? Modules accepted: Orders ? ?

## 2021-07-29 ENCOUNTER — Encounter: Payer: Self-pay | Admitting: Medical

## 2021-07-30 ENCOUNTER — Ambulatory Visit: Payer: BC Managed Care – PPO | Admitting: Physician Assistant

## 2021-07-31 ENCOUNTER — Encounter: Payer: Self-pay | Admitting: Nurse Practitioner

## 2021-07-31 ENCOUNTER — Ambulatory Visit: Payer: BC Managed Care – PPO | Admitting: Nurse Practitioner

## 2021-07-31 VITALS — BP 118/78 | HR 65 | Ht 67.0 in | Wt 154.0 lb

## 2021-07-31 DIAGNOSIS — R159 Full incontinence of feces: Secondary | ICD-10-CM | POA: Diagnosis not present

## 2021-07-31 DIAGNOSIS — K5900 Constipation, unspecified: Secondary | ICD-10-CM | POA: Diagnosis not present

## 2021-07-31 NOTE — Patient Instructions (Signed)
?  1) Increase water intake to 8 glasses (64 oz) daily ? ?2) Stop Stevia. Avoid artificial sweeteners. ? ?3) Benefiber 1 tablespoon once daily if tolerated  ? ?4) Dulcolax suppository purchased over the counter, insert one suppository inside the rectum at bedtime x 3 nights to empty out rectum  ? ?5) Miralax 1 capful mixed in 8 ounces of water at bedtime as needed, stop if stool  becomes too loose  ? ?6) Contact Rossie Bretado NP in 1 to 2 weeks with an update  ?

## 2021-07-31 NOTE — Progress Notes (Signed)
? ? ? ?07/31/2021 ?Erica [name] ?[id number] ?1966-02-05 ? ? ?Chief Complaint: Fecal seepage  ? ?History of Present Illness: Erica [name] is a 56 year old female with a past medical history of depression, hypertension, osteopenia, uterine fibroids, anemia, GERD, IBS and colon polyps.She is followed by Dr. Loletha Carrow. She presents today for further evaluation regarding fecal seepage and constipation. She started eating a lot of nuts and used Splenda sweetener 2 to 3 packets daily about one month ago then developed "seepage" of malodorous stool. No purulent anorectal discharge. No rectal pain. She has chronic constipation, passes a BM once weekly for the past month. She took Linzess in the past  for less than one month which initially resulted in passing a normal BM most daily but her stools became too loose a few weeks later so she stopped taking it. No abdominal pain. She drinks less than 32oz of water daily. She used Anusol suppositories x 7 days as prescribed by her PCP without improvement. She underwent a colonoscopy 05/12/2018 which was normal. No other complaints today.  ? ? ?  Latest Ref Rng & Units 05/29/2021  ?  2:15 PM 01/05/2021  ?  2:51 PM 08/15/2020  ?  9:31 AM  ?CBC  ?WBC 3.8 - 10.8 Thousand/uL 4.6   5.0   4.1    ?Hemoglobin 11.7 - 15.5 g/dL 12.3   12.4   12.8    ?Hematocrit 35.0 - 45.0 % 37.7   37.2   38.6    ?Platelets 140 - 400 Thousand/uL 166   151.0   156.0    ?  ? ?  Latest Ref Rng & Units 05/29/2021  ?  2:15 PM 04/29/2021  ?  1:50 PM 01/05/2021  ?  2:51 PM  ?CMP  ?Glucose 65 - 99 mg/dL 101   75   88    ?BUN 7 - 25 mg/dL '19   19   16    '$ ?Creatinine 0.50 - 1.03 mg/dL 0.85   0.80   0.87    ?Sodium 135 - 146 mmol/L 138   137   137    ?Potassium 3.5 - 5.3 mmol/L 3.9   3.9   3.9    ?Chloride 98 - 110 mmol/L 105   101   100    ?CO2 20 - 32 mmol/L '24   29   29    '$ ?Calcium 8.6 - 10.4 mg/dL 9.7   9.8   9.9    ?Total Protein 6.1 - 8.1 g/dL 7.1   7.3     ?Total Bilirubin 0.2 - 1.2 mg/dL 0.3   0.4     ?Alkaline  Phos 39 - 117 U/L  60     ?AST 10 - 35 U/L 17   16     ?ALT 6 - 29 U/L 10   11     ?  ?EGD 08/21/2018: ?- The esophagus was normal. ?- The stomach was normal. ?- The cardia and gastric fundus were normal on retroflexion. ?- The examined duodenum was normal. ? ?Colonoscopy 05/12/2018: ?- The examined portion of the ileum was normal. ?- The entire examined colon is normal on direct and retroflexion views. ?- No specimens collected. ?- 10 year colonoscopy recall  ? ?Past Medical History:  ?Diagnosis Date  ? Anemia   ? Depression   ? Essential hypertension, benign   ? Fibroids   ? GERD (gastroesophageal reflux disease)   ? HH (hiatus hernia)   ? Hx of adenomatous colonic polyps   ?  IBS (irritable bowel syndrome)   ? Osteopenia 10/2016  ? SUI (stress urinary incontinence, female) 09/29/2016  ? ?Past Surgical History:  ?Procedure Laterality Date  ? COLONOSCOPY  01/01/2005  ? Normal   ? POLYPECTOMY    ? UTERINE FIBROID EMBOLIZATION    ? ? ?Current Medications, Allergies, Past Medical History, Past Surgical History, Family History and Social History were reviewed in [employer] record. ? ?Review of Systems:   ?Constitutional: Negative for fever, sweats, chills or weight loss.  ?Respiratory: Negative for shortness of breath.   ?Cardiovascular: Negative for chest pain, palpitations and leg swelling.  ?Gastrointestinal: See HPI.  ?Musculoskeletal: Negative for back pain or muscle aches.  ?Neurological: Negative for dizziness, headaches or paresthesias.  ? ? ?Physical Exam: ?LMP  (LMP Unknown)  ?BP 118/78   Pulse 65   Ht '5\' 7"'$  (1.702 m)   Wt 154 lb (69.9 kg)   LMP  (LMP Unknown)   SpO2 99%   BMI 24.12 kg/m?  ? ?General: 56 year old female in NAD.  ?Head: Normocephalic and atraumatic. ?Eyes: No scleral icterus. Conjunctiva pink . ?Ears: Normal auditory acuity. ?Mouth: Dentition intact. No ulcers or lesions.  ?Lungs: Clear throughout to auscultation. ?Heart: Regular rate and rhythm, no murmur. ?Abdomen:  Soft, nontender and nondistended. No masses or hepatomegaly. Normal bowel sounds x 4 quadrants.  ?Rectal: No external hemorrhoids of anal fissures. Diminished anal sphincter tone. No mass. A small amount of brown solid stool in the rectal vault, no fecal impaction. Internal hemorrhoids without prolapse. Melissa CMA present during exam.  ?Musculoskeletal: Symmetrical with no gross deformities. ?Extremities: No edema. ?Neurological: Alert oriented x 4. No focal deficits.  ?Psychological: Alert and cooperative. Normal mood and affect ? ?Assessment and Recommendations: ? ?21) 56 year old female with chronic constipation with new onset fecal leakage x 4 weeks.  ?-Increase water intake to 64 ounces daily ?-Benefiber 1 tablespoon daily as tolerated  ?-Dulcolax suppository Q HS  3 nights to empty out the rectum  ?-Miralax Q HS PRN  ?-Stop Stevia sweetener  ?-I discussed rectal physical therapy, patient did not wish to purse at this time ?-Patient to contact me early next week with an update  ? ?2) GERD, stable  ? ?3) History of colon polyps per colonoscopy in 2003. No polyps per colonoscopy 2006 and 04/2018. ?-Next colonoscopy due 04/2028 ?

## 2021-08-03 ENCOUNTER — Other Ambulatory Visit: Payer: Self-pay | Admitting: Family Medicine

## 2021-08-03 NOTE — Telephone Encounter (Signed)
Please clarify what dose of lisinopril pt is on.  This was from your last visit earlier this month 4/6.  Not sure if the '10mg'$  ever got sent in. ? ?Htn- bp has increased since last visit when bp med stopped. Will start you back on lisinopril 10 mg daily with chlorthalidone 15 mg daily(let me know if your pharmacy does not carry this dose of chlorthalidone). ?

## 2021-08-04 ENCOUNTER — Encounter: Payer: Self-pay | Admitting: *Deleted

## 2021-08-04 NOTE — Telephone Encounter (Signed)
I was questioning the the dose because this dose was sent from the pharmacy.  I will just deny refill.  Do you still want to see her?  Sorry about the confusion. ?

## 2021-08-07 ENCOUNTER — Telehealth: Payer: Self-pay | Admitting: Nurse Practitioner

## 2021-08-07 NOTE — Telephone Encounter (Signed)
Patient called with a follow up she states she took the suppositories for 3 days the last one made her sick so she stopped and she did have a BM. She thinks the MIralax was giving her a lot of gas and so she stopped that as well and is feeling a lot better.   ?

## 2021-08-11 NOTE — Telephone Encounter (Signed)
Following message received from [facility], [place] ? ?Noralyn Pick, NP  You 21 hours ago (4:57 PM)  ? ?Mariaguadalupe Fialkowski, pls contact the patient Tues 08/11/2021 to verify if she still feels well and if her fecal leakage stopped? THX   ? ?LVM requesting returned call. ?

## 2021-08-14 ENCOUNTER — Ambulatory Visit: Payer: BC Managed Care – PPO

## 2021-08-14 ENCOUNTER — Other Ambulatory Visit (INDEPENDENT_AMBULATORY_CARE_PROVIDER_SITE_OTHER): Payer: BC Managed Care – PPO

## 2021-08-14 DIAGNOSIS — R252 Cramp and spasm: Secondary | ICD-10-CM | POA: Diagnosis not present

## 2021-08-14 DIAGNOSIS — I10 Essential (primary) hypertension: Secondary | ICD-10-CM | POA: Diagnosis not present

## 2021-08-14 LAB — COMPREHENSIVE METABOLIC PANEL
ALT: 16 U/L (ref 0–35)
AST: 23 U/L (ref 0–37)
Albumin: 4.4 g/dL (ref 3.5–5.2)
Alkaline Phosphatase: 60 U/L (ref 39–117)
BUN: 17 mg/dL (ref 6–23)
CO2: 31 mEq/L (ref 19–32)
Calcium: 9.7 mg/dL (ref 8.4–10.5)
Chloride: 99 mEq/L (ref 96–112)
Creatinine, Ser: 0.95 mg/dL (ref 0.40–1.20)
GFR: 66.75 mL/min (ref >60.00)
Glucose, Bld: 81 mg/dL (ref 70–99)
Potassium: 3.9 mEq/L (ref 3.5–5.1)
Sodium: 138 mEq/L (ref 135–145)
Total Bilirubin: 0.7 mg/dL (ref 0.2–1.2)
Total Protein: 7.3 g/dL (ref 6.0–8.3)

## 2021-08-14 LAB — MAGNESIUM: Magnesium: 1.7 mg/dL (ref 1.5–2.5)

## 2021-08-14 NOTE — Addendum Note (Signed)
Addended by: Legrand Rams on: 08/14/2021 10:23 AM ? ? Modules accepted: Orders ? ?

## 2021-08-14 NOTE — Progress Notes (Addendum)
Pt here for Blood pressure check per Percell Miller Saguier: "Based on that bp level recommend not taking either med today. Keep checking daily and ask you follow up in one week or so for in office bp check. Also if we have not checked you bp cuff against ours in the office ask you bring your blood pressure cuff in." ? ?Pt currently takes: lisinopril 10 mg daily chlorthalidone 15 mg daily ? ?Pt states she was taking the mediation everyday for about 10 days and then her BP was 109/74 and then stopped it.  ? ?Manual cuff: ?BP today: 124/72 ?HR =61 ? ?Automatic cuff:  ?BP today 127/87 ?HR: 64 ? ?Pt advised per Percell Miller: that she can remain off of her BP meds, keep records off her readings and schedule an office visit in 2 weeks. Labs drawn today: CMP, Mag.  ? ? ?Mackie Pai, PA-C  ? ?

## 2021-08-28 ENCOUNTER — Ambulatory Visit: Payer: BC Managed Care – PPO | Admitting: Medical

## 2021-08-28 ENCOUNTER — Other Ambulatory Visit: Payer: Self-pay | Admitting: Medical

## 2021-08-28 ENCOUNTER — Encounter: Payer: Self-pay | Admitting: Medical

## 2021-08-28 VITALS — BP 145/80 | HR 66 | Temp 98.2°F | Ht 67.0 in | Wt 158.0 lb

## 2021-08-28 DIAGNOSIS — R600 Localized edema: Secondary | ICD-10-CM | POA: Diagnosis not present

## 2021-08-28 DIAGNOSIS — I10 Essential (primary) hypertension: Secondary | ICD-10-CM

## 2021-08-28 MED ORDER — LISINOPRIL 10 MG PO TABS: 10.0000 mg | ORAL_TABLET | Freq: Every day | ORAL | 0 refills | Status: DC

## 2021-08-28 NOTE — Progress Notes (Signed)
Subjective:    Patient ID: Erica [name], female    DOB: 13-Aug-1965, 56 y.o.   MRN: [id number]  HPI Pt in for bp check.   Pt has been on bp meds in past. Pt has bp that has historically been high on treatments then at times very low.   Most recently bp Monday 139/83, 139 88. Tuesday 165/104, 147/88, 129/85, Wed 142/93, 136/88 Thursday 147/90 and 148/91.   Low reading she sent me in the past was 102/69 around April 19,23 and advised stop both lisinopril and chlorthalidone. On 4/21/20223 2 days after she stopped med bp 118/78.  She thinks 165/104 was poor technique.  Pt states on Monday she had mild ankle swelling both sides. Also calves were swollen.  Pt was last on lisinopril 10 mg daily and chlorthalidone. When she sent me the    She has a lot of work stress. Computer   Review of Systems  Constitutional:  Negative for chills, diaphoresis, fatigue and fever.  Respiratory:  Negative for cough, chest tightness, shortness of breath and wheezing.   Cardiovascular:  Negative for chest pain and palpitations.  Gastrointestinal:  Negative for abdominal pain.  Musculoskeletal:  Negative for back pain.  Neurological:  Negative for dizziness and light-headedness.  Hematological:  Negative for adenopathy. Does not bruise/bleed easily.  Psychiatric/Behavioral:  Negative for behavioral problems and confusion.     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  Tobacco Use   Smoking status: Never   Smokeless tobacco: Never  Vaping Use   Vaping Use: Never used  Substance and Sexual Activity   Alcohol use: No    Alcohol/week: 0.0 standard drinks    Comment: occ   Drug use: No   Sexual activity: Not Currently    Birth control/protection: Abstinence, Post-menopausal  Other Topics Concern   Not on file  Social History Narrative   Not on file   Social Determinants of Health   Financial Resource Strain: Not on file  Food Insecurity: Not on file  Transportation Needs: Not on file  Physical Activity: Not on file  Stress: Not on file  Social Connections: Not on file  Intimate Partner Violence: Not on file    Past Surgical History:  Procedure Laterality Date   COLONOSCOPY  01/01/2005   Normal    POLYPECTOMY     UTERINE FIBROID EMBOLIZATION      Family History  Problem Relation Age of Onset   Hodgkin's lymphoma Mother    Alzheimer's disease Father    Colon cancer Maternal Grandfather    Stroke Paternal Grandfather    Breast cancer Sister    Breast cancer Maternal Aunt    Esophageal cancer Neg Hx    Stomach cancer Neg Hx    Rectal cancer Neg Hx     No Known  Allergies  Current Outpatient Medications on File Prior to Visit  Medication Sig Dispense Refill   Biotin 10 MG CAPS Take by mouth.     chlorthalidone (HYGROTON) 25 MG tablet Take 1 tablet (25 mg total) by mouth daily. 30 tablet 3   famotidine (PEPCID) 20 MG tablet TAKE 1 TABLET BY MOUTH TWICE A DAY 180 tablet 1   hydrocortisone (ANUSOL-HC) 25 MG suppository Place 1 suppository (25 mg total) rectally 2 (two) times daily. 12 suppository 0   levocetirizine (XYZAL) 5 MG tablet Take 1 tablet (5 mg total) by mouth every evening. For nasal congestion 30 tablet 11   linaclotide (LINZESS) 72 MCG capsule Take 1 capsule (72 mcg total) by mouth daily before breakfast. 30 capsule 11   lisinopril-hydrochlorothiazide (ZESTORETIC) 20-25 MG tablet  Take 1 tablet by mouth daily. 30 tablet 0   Multiple Vitamins-Minerals (CENTRUM SILVER 50+WOMEN PO) Take by mouth.     No current facility-administered medications on file prior to visit.    BP (!) 145/80   Pulse 66   Temp 98.2 F (36.8 C)   Ht '5\' 7"'$  (1.702 m)   Wt 158 lb (71.7 kg)   LMP  (LMP Unknown)   SpO2 100%   BMI 24.75 kg/m       Objective:   Physical Exam  General Mental Status- Alert. General Appearance- Not in acute distress.   Skin General: Color- Normal Color. Moisture- Normal Moisture.  Neck Carotid Arteries- Normal color. Moisture- Normal Moisture. No carotid bruits. No JVD.  Chest and Lung Exam Auscultation: Breath Sounds:-Normal.  Cardiovascular Auscultation:Rythm- Regular. Murmurs & Other Heart Sounds:Auscultation of the heart reveals- No Murmurs.  Abdomen Inspection:-Inspeection Normal. Palpation/Percussion:Note:No mass. Palpation and Percussion of the abdomen reveal- Non Tender, Non Distended + BS, no rebound or guarding.   Neurologic Cranial Nerve exam:- CN III-XII intact(No nystagmus), symmetric smile. Strength:- 5/5 equal and symmetric strength both upper and lower extremities.   Lower ext- negative homans signs. Rt anle mild swollen. Presently calfs symmetric.      Assessment & Plan:   Patient Instructions  Your bp is mild high today. Over past 4 days majority have been in mild high range. At this time will place you on lisinopril 10 mg daily. When you do check bp make sure do proper technique. Also make a point not to check bp is very stress. However do check if you have ha or dizziness.   For recent pedal edema will get cmp, bnp and cxr.   Follow up in 2 weeks or sooner if needed.   Mackie Pai, PA-C    Time spent with patient today was  30 minutes which consisted of chart review, discussing diagnosis, work up treatment and documentation.

## 2021-08-28 NOTE — Patient Instructions (Signed)
Your bp is mild high today. Over past 4 days majority have been in mild high range. At this time will place you on lisinopril 10 mg daily. When you do check bp make sure do proper technique. Also make a point not to check bp is very stress. However do check if you have ha or dizziness.   For recent pedal edema will get cmp, bnp and cxr.   Follow up in 2 weeks or sooner if needed.

## 2021-08-29 ENCOUNTER — Encounter: Payer: Self-pay | Admitting: Medical

## 2021-08-31 LAB — COMPREHENSIVE METABOLIC PANEL
AG Ratio: 1.6 (calc) (ref 1.0–2.5)
ALT: 24 U/L (ref 6–29)
AST: 19 U/L (ref 10–35)
Albumin: 4.1 g/dL (ref 3.6–5.1)
Alkaline phosphatase (APISO): 63 U/L (ref 37–153)
BUN: 19 mg/dL (ref 7–25)
CO2: 25 mmol/L (ref 20–32)
Calcium: 9.6 mg/dL (ref 8.6–10.4)
Chloride: 105 mmol/L (ref 98–110)
Creat: 0.81 mg/dL (ref 0.50–1.03)
Globulin: 2.6 g/dL (calc) (ref 1.9–3.7)
Glucose, Bld: 80 mg/dL (ref 65–99)
Potassium: 4.6 mmol/L (ref 3.5–5.3)
Sodium: 140 mmol/L (ref 135–146)
Total Bilirubin: 0.3 mg/dL (ref 0.2–1.2)
Total Protein: 6.7 g/dL (ref 6.1–8.1)

## 2021-08-31 LAB — BRAIN NATRIURETIC PEPTIDE

## 2021-08-31 LAB — EXTRA LAV TOP TUBE

## 2021-09-08 ENCOUNTER — Ambulatory Visit
Admission: RE | Admit: 2021-09-08 | Discharge: 2021-09-08 | Disposition: A | Discharge status: Home / Self Care | Payer: BC Managed Care – PPO | Source: Ambulatory Visit | Attending: Medical | Admitting: Medical

## 2021-09-08 DIAGNOSIS — I10 Essential (primary) hypertension: Secondary | ICD-10-CM

## 2021-09-08 DIAGNOSIS — J811 Chronic pulmonary edema: Secondary | ICD-10-CM | POA: Diagnosis not present

## 2021-09-08 DIAGNOSIS — R600 Localized edema: Secondary | ICD-10-CM

## 2021-09-11 ENCOUNTER — Ambulatory Visit: Payer: BC Managed Care – PPO | Admitting: Medical

## 2021-09-11 VITALS — BP 132/77 | HR 70 | Resp 18 | Ht 67.0 in | Wt 156.4 lb

## 2021-09-11 DIAGNOSIS — R0989 Other specified symptoms and signs involving the circulatory and respiratory systems: Secondary | ICD-10-CM | POA: Diagnosis not present

## 2021-09-11 DIAGNOSIS — F419 Anxiety disorder, unspecified: Secondary | ICD-10-CM | POA: Diagnosis not present

## 2021-09-11 MED ORDER — BUSPIRONE HCL 7.5 MG PO TABS: 7.5000 mg | ORAL_TABLET | Freq: Two times a day (BID) | ORAL | 0 refills | Status: DC

## 2021-09-11 NOTE — Patient Instructions (Addendum)
Hypertension with labile blood pressure readings(bp good today.).  Formally went on more aggressive blood pressure regimen had episodes of hypotension.  Recently on just lisinopril 10 mg daily still having labile blood pressures with blood pressure increase at times up to 163/95.  Has been the case for numerous weeks.  Advising patient to continue lisinopril 10 mg daily and place referral to cardiologist today to consider possible continuous blood pressure monitoring to get a better understanding on how to treat BP levels.  In addition discussion today patient thinks anxiety might be playing a role in her blood pressure spikes.  Her GAD-7 score was 8.  Discussed use of low-dose of BuSpar 7.5 mg to use twice daily.  She can start that today and asked to check her blood pressures to see if it has any impact.  Patient expresses reluctance presently to starting anxiety medications.  Advised patient that I am  sending medication to the pharmacy to make available if she decides to take.   Follow-up in 2 weeks after upcoming scheduled cardiologist appointment. Sooner if needed.

## 2021-09-11 NOTE — Progress Notes (Signed)
Subjective:    Patient ID: Erica [name], female    DOB: December 29, 1965, 56 y.o.   MRN: [id number]  HPI  Pt bp is 132/77. Pt checked bp the other day 31 st  at the gym with readings  before exercise 163/95, 151/93 and after exercise was 130/90. Then later 148/95.   Yesterday bp was 140/81. Then later 140/82.  On 28th 150/94 and later 128/84.   On last visit A/P below  "Your bp is mild high today. Over past 4 days majority have been in mild high range. At this time will place you on lisinopril 10 mg daily. When you do check bp make sure do proper technique. Also make a point not to check bp is very stress. However do check if you have ha or dizziness."    Pt wonders if blood pressure fluctuation/high level related to anxiety. She states years ago she took lexapro and worked ok. Prozac made her feel like a robot.(Took away her emotion)   Review of Systems  Constitutional:  Negative for chills, fatigue and fever.  Eyes:  Negative for pain and itching.  Respiratory:  Negative for cough.   Cardiovascular:  Negative for chest pain and palpitations.  Gastrointestinal:  Negative for abdominal pain.  Neurological:  Negative for dizziness, speech difficulty and light-headedness.  Hematological:  Negative for adenopathy. Does not bruise/bleed easily.  Psychiatric/Behavioral:  The patient is nervous/anxious.     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 [employer]  Tobacco Use   Smoking status: Never   Smokeless tobacco: Never  Vaping Use   Vaping Use: Never used  Substance and Sexual Activity   Alcohol use: No    Alcohol/week: 0.0 standard drinks    Comment: occ   Drug use: No   Sexual activity: Not Currently    Birth control/protection: Abstinence, Post-menopausal  Other Topics Concern   Not on file  Social History Narrative   Not on file   Social Determinants of Health   Financial Resource Strain: Not on file  Food Insecurity: Not on file  Transportation Needs: Not on file  Physical Activity: Not on file  Stress: Not on file  Social Connections: Not on file  Intimate Partner Violence: Not on file    Past Surgical History:  Procedure Laterality Date   COLONOSCOPY  01/01/2005   Normal    POLYPECTOMY     UTERINE FIBROID EMBOLIZATION      Family History  Problem Relation Age of Onset   Hodgkin's lymphoma Mother    Alzheimer's disease Father    Colon cancer Maternal Grandfather    Stroke Paternal Grandfather    Breast cancer Sister    Breast cancer Maternal Aunt    Esophageal cancer Neg Hx    Stomach cancer Neg Hx    Rectal cancer  Neg Hx     No Known Allergies  Current Outpatient Medications on File Prior to Visit  Medication Sig Dispense Refill   Biotin 10 MG CAPS Take by mouth.     chlorthalidone (HYGROTON) 25 MG tablet Take 1 tablet (25 mg total) by mouth daily. 30 tablet 3   famotidine (PEPCID) 20 MG tablet TAKE 1 TABLET BY MOUTH TWICE A DAY 180 tablet 1   hydrocortisone (ANUSOL-HC) 25 MG suppository Place 1 suppository (25 mg total) rectally 2 (two) times daily. 12 suppository 0   levocetirizine (XYZAL) 5 MG tablet Take 1 tablet (5 mg total) by mouth every evening. For nasal congestion 30 tablet 11   linaclotide (LINZESS) 72 MCG capsule Take 1 capsule (72 mcg total) by mouth daily before breakfast. 30 capsule 11   lisinopril (ZESTRIL) 10 MG tablet TAKE 1 TABLET BY MOUTH  EVERY DAY 90 tablet 1   Multiple Vitamins-Minerals (CENTRUM SILVER 50+WOMEN PO) Take by mouth.     No current facility-administered medications on file prior to visit.    BP 132/77   Pulse 70   Resp 18   Ht '5\' 7"'$  (1.702 m)   Wt 156 lb 6.4 oz (70.9 kg)   LMP  (LMP Unknown)   SpO2 99%   BMI 24.50 kg/m       Objective:   Physical Exam  General Mental Status- Alert. General Appearance- Not in acute distress.   Skin General: Color- Normal Color. Moisture- Normal Moisture.  Neck Carotid Arteries- Normal color. Moisture- Normal Moisture. No carotid bruits. No JVD.  Chest and Lung Exam Auscultation: Breath Sounds:-Normal.  Cardiovascular Auscultation:Rythm- Regular. Murmurs & Other Heart Sounds:Auscultation of the heart reveals- No Murmurs.   Neurologic Cranial Nerve exam:- CN III-XII intact(No nystagmus), symmetric smile. Strength:- 5/5 equal and symmetric strength both upper and lower extremities.       Assessment & Plan:   Patient Instructions  Hypertension with labile blood pressure readings(bp good today.).  Formally went on more aggressive blood pressure regimen had episodes of hypotension.  Recently on just lisinopril 10 mg daily still having labile blood pressures with blood pressure increase at times up to 163/95.  Has been the case for numerous weeks.  Advising patient to continue lisinopril 10 mg daily and place referral to cardiologist today to consider possible continuous blood pressure monitoring to get a better understanding on how to treat BP levels.  In addition discussion today patient thinks anxiety might be playing a role in her blood pressure spikes.  Her GAD-7 score was 8.  Discussed use of low-dose of BuSpar 7.5 mg to use twice daily.  She can start that today and asked to check her blood pressures to see if it has any impact.  Patient expresses reluctance presently to starting anxiety medications.  Advised patient that I am  sending medication to the  pharmacy to make available if she decides to take.   Follow-up in 2 weeks after upcoming scheduled cardiologist appointment. Sooner if needed.   Mackie Pai, PA-C

## 2021-09-22 DIAGNOSIS — D219 Benign neoplasm of connective and other soft tissue, unspecified: Secondary | ICD-10-CM | POA: Insufficient documentation

## 2021-09-22 DIAGNOSIS — K219 Gastro-esophageal reflux disease without esophagitis: Secondary | ICD-10-CM | POA: Insufficient documentation

## 2021-09-22 DIAGNOSIS — K449 Diaphragmatic hernia without obstruction or gangrene: Secondary | ICD-10-CM | POA: Insufficient documentation

## 2021-09-23 NOTE — Progress Notes (Signed)
Cardiology Office Note:    Date:  09/24/2021   ID:  Erica [name], DOB [birth year], MRN [id number]  PCP:  Mackie Pai, PA-C  Cardiologist:  Shirlee More, MD   Referring MD: Mackie Pai, PA-C  ASSESSMENT:    1. Essential hypertension    PLAN:    In order of problems listed above:  She has established essential hypertension follows a good lifestyle previously had symptomatic hypotension with combined ACE and thiazide now is getting erratic blood pressures that I think is influenced by technique.  We stopped discussed good technique measurements using the right arm and I think she will do well with an ARB long-term will be more effective than ACE inhibitor also to avoid side effects.  I asked her in a few weeks to send me a list of her blood pressures I do not think I need to bring her back to my office if she required an additional agent I would use a calcium channel blocker.  Next appointment   Medication Adjustments/Labs and Tests Ordered: Current medicines are reviewed at length with the patient today.  Concerns regarding medicines are outlined above.  No orders of the defined types were placed in this encounter.  No orders of the defined types were placed in this encounter.    Chief complaint: I have high blood pressure and my recordings are erratic  History of Present Illness:    Erica [name] is a 56 y.o. female who is being seen today for the evaluation of hypertension at the request of Saguier, Percell Miller, [place].  She has a history of hypertension likely for 20 years She has a good lifestyle and she is sodium restricts gets regular activity exercise and is compliant with her medications Previously took a thiazide diuretic plus ACE inhibitor and had hypotension Now with ACE inhibitor alone she gets erratic blood pressures at times low at times high. We discussed good technique for blood pressure education and repeat by me in the office 132/70 in the right  122/70 on the left. She has no known history of kidney or adrenal disease. I told her in my practice I generally do not use ACE inhibitors because of the concern of side effects particularly cough or angioedema or urticaria or pruritus especially in African-American population. We will switch to a generic ARB I think is a little bit more effective medication with a goal blood pressure in the range of 120/80 10  Past Medical History:  Diagnosis Date   Anemia    Depression    Essential hypertension, benign    Fibroids    GERD (gastroesophageal reflux disease)    HH (hiatus hernia)    Hx of adenomatous colonic polyps    IBS (irritable bowel syndrome)    Osteopenia 10/2016   SUI (stress urinary incontinence, female) 09/29/2016    Past Surgical History:  Procedure Laterality Date   COLONOSCOPY  01/01/2005   Normal    POLYPECTOMY     UTERINE FIBROID EMBOLIZATION      Current Medications: Current Meds  Medication Sig   busPIRone (BUSPAR) 7.5 MG tablet Take 1 tablet (7.5 mg total) by mouth 2 (two) times daily.   famotidine (PEPCID) 20 MG tablet TAKE 1 TABLET BY MOUTH TWICE A DAY   levocetirizine (XYZAL) 5 MG tablet Take 1 tablet (5 mg total) by mouth every evening. For nasal congestion   lisinopril (ZESTRIL) 10 MG tablet TAKE 1 TABLET BY MOUTH EVERY DAY   Multiple Vitamins-Minerals (HAIR SKIN AND  NAILS FORMULA PO) Take 1 tablet by mouth daily at 12 noon.     Allergies:   Patient has no known allergies.   Social History   Socioeconomic History   Marital status: Single    Spouse name: Not on file   Number of children: 0   Years of education: Not on file   Highest education level: Not on file  Occupational History   Occupation: MI service specialist    Employer: [employer]  Tobacco Use   Smoking status: Never    Passive exposure: Never   Smokeless tobacco: Never  Vaping Use   Vaping Use: Never used  Substance and Sexual Activity   Alcohol use: No    Alcohol/week:  0.0 standard drinks of alcohol    Comment: occ   Drug use: No   Sexual activity: Not Currently    Birth control/protection: Abstinence, Post-menopausal  Other Topics Concern   Not on file  Social History Narrative   Not on file   Social Determinants of Health   Financial Resource Strain: Not on file  Food Insecurity: Not on file  Transportation Needs: Not on file  Physical Activity: Not on file  Stress: Not on file  Social Connections: Not on file     Family History: The patient's family history includes Alzheimer's disease in her father; Breast cancer in her maternal aunt and sister; Colon cancer in her maternal grandfather; Hodgkin's lymphoma in her mother; Stroke in her paternal grandfather. There is no history of Esophageal cancer, Stomach cancer, or Rectal cancer.  ROS:   ROS Please see the history of present illness.     All other systems reviewed and are negative.  EKGs/Labs/Other Studies Reviewed:    The following studies were reviewed today:   EKG:  EKG is  ordered today.  The ekg ordered today is personally reviewed and demonstrates sinus rhythm normal no evidence of LVH or hypertensive heart disease  Recent Labs: 05/29/2021: Hemoglobin 12.3; Platelets 166; TSH 0.63 08/14/2021: Magnesium 1.7 08/28/2021: ALT 24; Brain Natriuretic Peptide CANCELED; BUN 19; Creat 0.81; Potassium 4.6; Sodium 140  Recent Lipid Panel    Component Value Date/Time   CHOL 187 05/29/2020 1549   CHOL 178 09/27/2019 1213   TRIG 81.0 05/29/2020 1549   HDL 69.60 05/29/2020 1549   HDL 63 09/27/2019 1213   CHOLHDL 3 05/29/2020 1549   VLDL 16.2 05/29/2020 1549   LDLCALC 101 (H) 05/29/2020 1549   LDLCALC 104 (H) 09/27/2019 1213    Physical Exam:    VS:  BP 136/80 (BP Location: Left Arm, Patient Position: Sitting)   Pulse 65   Ht '5\' 7"'$  (1.702 m)   Wt 154 lb 0.6 oz (69.9 kg)   LMP  (LMP Unknown)   SpO2 99%   BMI 24.13 kg/m     Wt Readings from Last 3 Encounters:  09/24/21 154 lb 0.6  oz (69.9 kg)  09/11/21 156 lb 6.4 oz (70.9 kg)  08/28/21 158 lb (71.7 kg)     GEN: Healthy appearing and ideal weight well nourished, well developed in no acute distress HEENT: Normal NECK: No JVD; No carotid bruits LYMPHATICS: No lymphadenopathy CARDIAC: RRR, no murmurs, rubs, gallops RESPIRATORY:  Clear to auscultation without rales, wheezing or rhonchi  ABDOMEN: Soft, non-tender, non-distended MUSCULOSKELETAL:  No edema; No deformity  SKIN: Warm and dry NEUROLOGIC:  Alert and oriented x 3 PSYCHIATRIC:  Normal affect     Signed, Shirlee More, MD  09/24/2021 12:14 PM  [facility]

## 2021-09-24 ENCOUNTER — Encounter: Payer: Self-pay | Admitting: Cardiology

## 2021-09-24 ENCOUNTER — Ambulatory Visit: Payer: BC Managed Care – PPO | Admitting: Cardiology

## 2021-09-24 VITALS — BP 136/80 | HR 65 | Ht 67.0 in | Wt 154.0 lb

## 2021-09-24 DIAGNOSIS — Z803 Family history of malignant neoplasm of breast: Secondary | ICD-10-CM

## 2021-09-24 DIAGNOSIS — F419 Anxiety disorder, unspecified: Secondary | ICD-10-CM | POA: Insufficient documentation

## 2021-09-24 DIAGNOSIS — R209 Unspecified disturbances of skin sensation: Secondary | ICD-10-CM | POA: Insufficient documentation

## 2021-09-24 DIAGNOSIS — D509 Iron deficiency anemia, unspecified: Secondary | ICD-10-CM

## 2021-09-24 DIAGNOSIS — N920 Excessive and frequent menstruation with regular cycle: Secondary | ICD-10-CM

## 2021-09-24 DIAGNOSIS — I10 Essential (primary) hypertension: Secondary | ICD-10-CM | POA: Diagnosis not present

## 2021-09-24 DIAGNOSIS — K5909 Other constipation: Secondary | ICD-10-CM | POA: Insufficient documentation

## 2021-09-24 HISTORY — DX: Iron deficiency anemia, unspecified: D50.9

## 2021-09-24 HISTORY — DX: Excessive and frequent menstruation with regular cycle: N92.0

## 2021-09-24 HISTORY — DX: Unspecified disturbances of skin sensation: R20.9

## 2021-09-24 HISTORY — DX: Other constipation: K59.09

## 2021-09-24 HISTORY — DX: Family history of malignant neoplasm of breast: Z80.3

## 2021-09-24 HISTORY — DX: Anxiety disorder, unspecified: F41.9

## 2021-09-24 MED ORDER — VALSARTAN 160 MG PO TABS
160.0000 mg | ORAL_TABLET | Freq: Every day | ORAL | 3 refills | Status: DC
Start: 2021-09-24 — End: 2022-09-14

## 2021-09-24 NOTE — Patient Instructions (Addendum)
Medication Instructions:  Your physician has recommended you make the following change in your medication:   STOP: Lisinopril START: Valsartan 160 mg daily  *If you need a refill on your cardiac medications before your next appointment, please call your pharmacy*   Lab Work: None If you have labs (blood work) drawn today and your tests are completely normal, you will receive your results only by: [place] (if you have MyChart) OR A paper copy in the mail If you have any lab test that is abnormal or we need to change your treatment, we will call you to review the results.   Testing/Procedures: None   Follow-Up: At [facility], you and your health needs are our priority.  As part of our continuing mission to provide you with exceptional heart care, we have created designated Provider Care Teams.  These Care Teams include your primary Cardiologist (physician) and Advanced Practice Providers (APPs -  Physician Assistants and Nurse Practitioners) who all work together to provide you with the care you need, when you need it.  We recommend signing up for the patient portal called "MyChart".  Sign up information is provided on this After Visit Summary.  MyChart is used to connect with patients for Virtual Visits (Telemedicine).  Patients are able to view lab/test results, encounter notes, upcoming appointments, etc.  Non-urgent messages can be sent to your provider as well.   To learn more about what you can do with MyChart, go to [email or web address].    Your next appointment:   Follow up as needed  The format for your next appointment:   In Person  Provider:   Shirlee More, MD    Other Instructions Send Dr. Bettina Gavia a list of blood pressures in 2 - 3 weeks.  Important Information About Sugar         [facility]beat  Tips to measure your blood pressure correctly  To determine whether you have hypertension, a medical professional will take a blood pressure reading.  How you prepare for the test, the position of your arm, and other factors can change a blood pressure reading by 10% or more. That could be enough to hide high blood pressure, start you on a drug you don't really need, or lead your doctor to incorrectly adjust your medications. National and international guidelines offer specific instructions for measuring blood pressure. If a doctor, nurse, or medical assistant isn't doing it right, don't hesitate to ask him or her to get with the guidelines. Here's what you can do to ensure a correct reading:  Don't drink a caffeinated beverage or smoke during the 30 minutes before the test.  Sit quietly for five minutes before the test begins.  During the measurement, sit in a chair with your feet on the floor and your arm supported so your elbow is at about heart level.  The inflatable part of the cuff should completely cover at least 80% of your upper arm, and the cuff should be placed on bare skin, not over a shirt.  Don't talk during the measurement.  Have your blood pressure measured twice, with a brief break in between. If the readings are different by 5 points or more, have it done a third time. There are times to break these rules. If you sometimes feel lightheaded when getting out of bed in the morning or when you stand after sitting, you should have your blood pressure checked while seated and then while standing to see if it falls from one position to  the next. Because blood pressure varies throughout the day, your doctor will rarely diagnose hypertension on the basis of a single reading. Instead, he or she will want to confirm the measurements on at least two occasions, usually within a few weeks of one another. The exception to this rule is if you have a blood pressure reading of 180/110 mm Hg or higher. A result this high usually calls for prompt treatment. It's also a good idea to have your blood pressure measured in both arms at least once, since the  reading in one arm (usually the right) may be higher than that in the left. A 2014 study in The American Journal of Medicine of nearly 3,400 people found average arm- to-arm differences in systolic blood pressure of about 5 points. The higher number should be used to make treatment decisions. In 2017, new guidelines from the [place], the [employer] of Cardiology, and nine other health organizations lowered the diagnosis of high blood pressure to 130/80 mm Hg or higher for all adults. The guidelines also redefined the various blood pressure categories to now include normal, elevated, Stage 1 hypertension, Stage 2 hypertension, and hypertensive crisis (see "Blood pressure categories"). Blood pressure categories  Blood pressure category SYSTOLIC (upper number)  DIASTOLIC (lower number)  Normal Less than 120 mm Hg and Less than 80 mm Hg  Elevated 120-129 mm Hg and Less than 80 mm Hg  High blood pressure: Stage 1 hypertension 130-139 mm Hg or 80-89 mm Hg  High blood pressure: Stage 2 hypertension 140 mm Hg or higher or 90 mm Hg or higher  Hypertensive crisis (consult your doctor immediately) Higher than 180 mm Hg and/or Higher than 120 mm Hg  Source: American Heart Association and American Stroke Association. For more on getting your blood pressure under control, buy Controlling Your Blood Pressure, a Special Health Report from [facility].

## 2021-10-04 ENCOUNTER — Other Ambulatory Visit: Payer: Self-pay | Admitting: Medical

## 2021-10-12 ENCOUNTER — Other Ambulatory Visit: Payer: Self-pay | Admitting: Medical

## 2021-11-02 ENCOUNTER — Other Ambulatory Visit: Payer: Self-pay | Admitting: Family Medicine

## 2021-11-02 DIAGNOSIS — Z1231 Encounter for screening mammogram for malignant neoplasm of breast: Secondary | ICD-10-CM

## 2021-11-04 ENCOUNTER — Telehealth: Payer: Self-pay | Admitting: Licensed Clinical Social Worker

## 2021-11-04 NOTE — Telephone Encounter (Signed)
Erica [name] returned missed call from office regarding appt time that opened up for annual exam visit for 11/05/2021 8:15am. Erica [name] is initially scheduled for Sept. Erica [name] was offered the appt for 11/05/2021 and she declined due to her work schedule.

## 2021-11-09 ENCOUNTER — Other Ambulatory Visit: Payer: Self-pay | Admitting: Family Medicine

## 2021-11-09 DIAGNOSIS — J309 Allergic rhinitis, unspecified: Secondary | ICD-10-CM

## 2021-11-13 ENCOUNTER — Ambulatory Visit
Admission: RE | Admit: 2021-11-13 | Discharge: 2021-11-13 | Disposition: A | Discharge status: Home / Self Care | Payer: BC Managed Care – PPO | Source: Ambulatory Visit | Attending: Family Medicine | Admitting: Family Medicine

## 2021-11-13 DIAGNOSIS — Z1231 Encounter for screening mammogram for malignant neoplasm of breast: Secondary | ICD-10-CM | POA: Diagnosis not present

## 2021-11-17 ENCOUNTER — Encounter: Payer: Self-pay | Admitting: Medical

## 2021-11-17 ENCOUNTER — Other Ambulatory Visit: Payer: Self-pay | Admitting: Family Medicine

## 2021-11-17 ENCOUNTER — Other Ambulatory Visit: Payer: Self-pay | Admitting: Medical

## 2021-11-17 DIAGNOSIS — R928 Other abnormal and inconclusive findings on diagnostic imaging of breast: Secondary | ICD-10-CM

## 2021-12-01 ENCOUNTER — Ambulatory Visit
Admission: RE | Admit: 2021-12-01 | Discharge: 2021-12-01 | Disposition: A | Discharge status: Home / Self Care | Payer: BC Managed Care – PPO | Source: Ambulatory Visit | Attending: Medical | Admitting: Medical

## 2021-12-01 ENCOUNTER — Other Ambulatory Visit: Payer: Self-pay | Admitting: Medical

## 2021-12-01 ENCOUNTER — Other Ambulatory Visit: Payer: BC Managed Care – PPO

## 2021-12-01 DIAGNOSIS — R928 Other abnormal and inconclusive findings on diagnostic imaging of breast: Secondary | ICD-10-CM

## 2021-12-01 DIAGNOSIS — R590 Localized enlarged lymph nodes: Secondary | ICD-10-CM | POA: Diagnosis not present

## 2021-12-01 DIAGNOSIS — R599 Enlarged lymph nodes, unspecified: Secondary | ICD-10-CM

## 2021-12-03 ENCOUNTER — Other Ambulatory Visit: Payer: Self-pay | Admitting: Diagnostic Radiology

## 2021-12-03 ENCOUNTER — Ambulatory Visit
Admission: RE | Admit: 2021-12-03 | Discharge: 2021-12-03 | Disposition: A | Discharge status: Home / Self Care | Payer: BC Managed Care – PPO | Source: Ambulatory Visit | Attending: Medical | Admitting: Medical

## 2021-12-03 DIAGNOSIS — Z0389 Encounter for observation for other suspected diseases and conditions ruled out: Secondary | ICD-10-CM | POA: Diagnosis not present

## 2021-12-03 DIAGNOSIS — R599 Enlarged lymph nodes, unspecified: Secondary | ICD-10-CM

## 2021-12-03 DIAGNOSIS — R590 Localized enlarged lymph nodes: Secondary | ICD-10-CM | POA: Diagnosis not present

## 2021-12-03 HISTORY — PX: BREAST BIOPSY: SHX20

## 2021-12-29 ENCOUNTER — Ambulatory Visit (INDEPENDENT_AMBULATORY_CARE_PROVIDER_SITE_OTHER): Payer: BC Managed Care – PPO

## 2021-12-29 ENCOUNTER — Other Ambulatory Visit (HOSPITAL_COMMUNITY)
Admission: RE | Admit: 2021-12-29 | Discharge: 2021-12-29 | Disposition: A | Discharge status: Home / Self Care | Payer: BC Managed Care – PPO | Source: Ambulatory Visit

## 2021-12-29 VITALS — BP 120/85 | HR 68 | Ht 67.0 in | Wt 154.4 lb

## 2021-12-29 DIAGNOSIS — Z124 Encounter for screening for malignant neoplasm of cervix: Secondary | ICD-10-CM

## 2021-12-29 DIAGNOSIS — Z01419 Encounter for gynecological examination (general) (routine) without abnormal findings: Secondary | ICD-10-CM | POA: Diagnosis not present

## 2021-12-29 NOTE — Progress Notes (Signed)
GYNECOLOGY OFFICE VISIT NOTE-WELL WOMAN EXAM  History:   Erica [name] is a 56 year old AA female who presents today for well woman exam.  She states she feels she is "sweating all the time down their."  She states she had a visit with GI She reports using baby powder with cornstarch with good results. She wonder  She reports some intermittent cramping and reports having fibroid embolization. She reports intermittent pelvic pain as well and contributes this to her fibroids. However, Erica [name] denies any abnormal vaginal discharge, bleeding, or other concerns.   Birth Control:  Postmenopausal  Reproductive Concerns Not sexually active   Breast Concerns/Exams: Erica [name] has completed her mammogram for this year.  She did have a biopsy of a suspicious mass, but it was benign. She reports that her sister died from breast cancer. She denies a family history of uterine, cervical, or ovarian cancer.  Medical and Nutrition PCP: Saguier, Francia Greaves Significant PMx: CHTN Exercise: 2-3x/week Gym, but hasn't been in 2 weeks d/t rise in covid Tobacco/Drugs/Alcohol: None Nutrition: Endorses balanced intake  Interior and spatial designer at home: Endorses, Lives Alone DV/A: N/A Social Support: [employer] Employment: Scientist, product/process development  Past Medical History:  Diagnosis Date   Anemia    Depression    Essential hypertension, benign    Fibroids    GERD (gastroesophageal reflux disease)    HH (hiatus hernia)    Hx of adenomatous colonic polyps    IBS (irritable bowel syndrome)    Osteopenia 10/2016   SUI (stress urinary incontinence, female) 09/29/2016    Past Surgical History:  Procedure Laterality Date   COLONOSCOPY  01/01/2005   Normal    POLYPECTOMY     UTERINE FIBROID EMBOLIZATION      The following portions of the patient's history were reviewed and updated as appropriate: allergies, current medications, past family history, past medical history, past social history, past  surgical history and problem list.   Health Maintenance:  Normal pap and negative HRHPV on August 2022.  Mammogram on August 2023 requiring follow up for suspicious findings .   Review of Systems:  Pertinent items noted in HPI and remainder of comprehensive ROS otherwise negative.    Objective:    Physical Exam BP 120/85   Pulse 68   Ht 5' 7" (1.702 m)   Wt 154 lb 6.4 oz (70 kg)   LMP  (LMP Unknown)   BMI 24.18 kg/m  Physical Exam Vitals reviewed. Exam conducted with a chaperone present.  Constitutional:      Appearance: Normal appearance.  HENT:     Head: Normocephalic and atraumatic.  Eyes:     Conjunctiva/sclera: Conjunctivae normal.  Cardiovascular:     Rate and Rhythm: Normal rate.     Heart sounds: Normal heart sounds.  Pulmonary:     Effort: Pulmonary effort is normal. No respiratory distress.     Breath sounds: Normal breath sounds.  Abdominal:     General: Bowel sounds are normal.     Palpations: Abdomen is soft.     Tenderness: There is no abdominal tenderness.  Genitourinary:    Comments:  Vaginal tissue with atrophy. No inflammation noted. Os with some stenosis. Pap smear completed with brush, spatula, and broom. Some uterine enlargement, but no tenderness.   Musculoskeletal:        General: Normal range of motion.     Cervical back: Normal range of motion.  Skin:    General: Skin is warm and dry.  Neurological:     Mental Status: She is alert and oriented to person, place, and time.  Psychiatric:        Mood and Affect: Mood normal.        Behavior: Behavior normal.      Labs and Imaging No results found for this or any previous visit (from the past 168 hour(s)). [place] AXILLARY NODE CORE BIOPSY LEFT  Addendum Date: 12/08/2021   ADDENDUM REPORT: 12/08/2021 07:35 ADDENDUM: Pathology revealed BENIGN FIBROADIPOSE TISSUE of the LEFT axilla, (spiral clip). This was found to be concordant by Dr. Everlean Alstrom. Pathology results were discussed with the  patient by telephone. The patient reported doing well after the biopsy with no tenderness at the site. Post biopsy instructions and care were reviewed and questions were answered. The patient was encouraged to call [facility] for any additional concerns. My direct phone number was provided. The patient was instructed to return for annual screening mammography in August 2024. Pathology results reported by Terie Purser, RN on 12/04/2021. Electronically Signed   By: Everlean Alstrom M.D.   On: 12/08/2021 07:35   Result Date: 12/08/2021 CLINICAL DATA:  Patient presents for ultrasound-guided core biopsy of a single borderline lymph node in the left axilla. EXAM: [place] AXILLARY NODE CORE BIOPSY LEFT COMPARISON:  Previous exam(s). PROCEDURE: I met with the patient and we discussed the procedure of ultrasound-guided biopsy, including benefits and alternatives. We discussed the high likelihood of a successful procedure. We discussed the risks of the procedure, including infection, bleeding, tissue injury, clip migration, and inadequate sampling. Informed written consent was given. The usual time-out protocol was performed immediately prior to the procedure. Using sterile technique and 1% Lidocaine as local anesthetic, under direct ultrasound visualization, a 14 gauge spring-loaded device was used to perform biopsy of a lymph node in the left axilla using a inferolateral to superomedial approach. At the conclusion of the procedure a spiral tissue marker clip was deployed into the biopsy cavity. Follow up 2 view mammogram was performed and dictated separately. IMPRESSION: Ultrasound guided biopsy of a borderline lymph node in the left axilla. No apparent complications. Electronically Signed: By: Everlean Alstrom M.D. On: 12/03/2021 13:57  [place] AXILLA RIGHT  Result Date: 12/01/2021 CLINICAL DATA:  The patient was called back for prominent nodes in both the right and left axilla. EXAM: ULTRASOUND OF  THE BILATERAL AXILLA COMPARISON:  None available. FINDINGS: Ultrasound is performed, showing normal lymph nodes in the right axilla. Most of the nodes on the left are normal as well. There is a single lymph node on the left with a cortex measuring up to 4 mm. The fatty hilum is retained. IMPRESSION: There is a single borderline/mildly abnormal lymph node on the left with a cortex measuring up to 4 mm. RECOMMENDATION: Recommend ultrasound-guided biopsy of the single borderline/abnormal left axillary lymph node. I have discussed the findings and recommendations with the patient. If applicable, a reminder letter will be sent to the patient regarding the next appointment. BI-RADS CATEGORY  4: Suspicious. Electronically Signed   By: Dorise Bullion III M.D.   On: 12/01/2021 11:40  [place] AXILLA LEFT  Result Date: 12/01/2021 CLINICAL DATA:  The patient was called back for prominent nodes in both the right and left axilla. EXAM: ULTRASOUND OF THE BILATERAL AXILLA COMPARISON:  None available. FINDINGS: Ultrasound is performed, showing normal lymph nodes in the right axilla. Most of the nodes on the left are normal as well. There is a  single lymph node on the left with a cortex measuring up to 4 mm. The fatty hilum is retained. IMPRESSION: There is a single borderline/mildly abnormal lymph node on the left with a cortex measuring up to 4 mm. RECOMMENDATION: Recommend ultrasound-guided biopsy of the single borderline/abnormal left axillary lymph node. I have discussed the findings and recommendations with the patient. If applicable, a reminder letter will be sent to the patient regarding the next appointment. BI-RADS CATEGORY  4: Suspicious. Electronically Signed   By: Dorise Bullion III M.D.   On: 12/01/2021 11:40    Assessment & Plan:  56 year old Female Well Woman Exam Desires Pap Smear Mammogram UTD Colonoscopy UTD  1. Encounter for routine gynecological examination with Papanicolaou smear of cervix -Exam  performed and findings discussed. -Informed that enlargement, of uterus, noted but likely d/t fibroids. -Educated on ASCCP guidelines regarding pap smear evaluation and frequency. -Patient requests pap for her reassurance. Provider agreeable.  -Informed of turnover time and provider/clinic policy on releasing results. -Encouraged to activate and utilize Mychart for reviewing of results, communication with office, and scheduling of appts. -Educated on AHA exercise recommendations of 30 minutes of moderate to vigorous activity at least 5x/week. Encouraged to "get back in the gym!"  -Educated and encouraged to continue SBE with increased breast awareness including examination of breast for skin changes, moles, tenderness, etc.  -Encouraged to continue to stay UTD with PCP appointments and other health maintenance.  Routine preventative health maintenance measures emphasized. Please refer to After Visit Summary for other counseling recommendations.   No follow-ups on file.      Maryann Conners, CNM 12/29/2021

## 2021-12-29 NOTE — Progress Notes (Unsigned)
Patient presents for AEX. Last Pap: 11/12/2020 -normal, desires today  Last Mammogram: 11/13/2021- Had biopsy- benign   Contraception: NONE- post menopausal   Vaginal/Urinary Symptoms: denies   STD Screen: Declines  Other Concerns: some urinary and fecal incontinence, uses powder to keep dry, but continues to have skin irritation in perineal area

## 2021-12-31 LAB — CYTOLOGY - PAP
Comment: NEGATIVE
Diagnosis: NEGATIVE
High risk HPV: NEGATIVE

## 2022-01-07 ENCOUNTER — Encounter: Payer: Self-pay | Admitting: Family Medicine

## 2022-01-07 ENCOUNTER — Ambulatory Visit: Payer: BC Managed Care – PPO | Admitting: Family Medicine

## 2022-01-07 VITALS — BP 142/86 | HR 67 | Temp 98.0°F | Resp 18 | Ht 67.0 in | Wt 157.0 lb

## 2022-01-07 DIAGNOSIS — I10 Essential (primary) hypertension: Secondary | ICD-10-CM | POA: Diagnosis not present

## 2022-01-07 DIAGNOSIS — R5383 Other fatigue: Secondary | ICD-10-CM

## 2022-01-07 DIAGNOSIS — R42 Dizziness and giddiness: Secondary | ICD-10-CM | POA: Diagnosis not present

## 2022-01-07 NOTE — Patient Instructions (Addendum)
OK to use Debrox (peroxide) in the ear to loosen up wax. Also recommend using a bulb syringe (for removing boogers from baby's noses) to flush through warm water and vinegar (3-4:1 ratio). An alternative, though more expensive, is an elephant ear washer wax removal kit. Do not use Q-tips as this can impact wax further.  Try to drink 55-60 oz of water daily outside of exercise.  Try to get 7 hrs of sleep nightly.   Give [place] 2-3 business days to get the results of your labs back.   Let us know if you need anything.

## 2022-01-07 NOTE — Progress Notes (Signed)
Chief Complaint  Patient presents with   Dizziness    Onset: 5 days    Erica [name] is 56 y.o. pt here for dizziness.  Duration: 5 days Pass out? No Spinning? No Recent illness/fever? No Headache? No Neurologic signs? No Change in PO intake? Eating normally, poor PO intake of fluids Palpitations? No She has been under a lot of stress at work.  She works for [employer] and is in [employer]  There are lots of foreclosures and people are calling up angry.  Hypertension Patient presents for hypertension follow up. She does monitor home blood pressures. Blood pressures ranging on average from 120-130's/70-80's. She is compliant with medication-valsartan 160 mg daily. Patient has these side effects of medication: none She is usually adhering to a healthy diet overall. Exercise: Not much lately No chest pain or shortness of breath  Past Medical History:  Diagnosis Date   Anemia    Depression    Essential hypertension, benign    Fibroids    GERD (gastroesophageal reflux disease)    HH (hiatus hernia)    Hx of adenomatous colonic polyps    IBS (irritable bowel syndrome)    Osteopenia 10/2016   SUI (stress urinary incontinence, female) 09/29/2016    Family History  Problem Relation Age of Onset   Hodgkin's lymphoma Mother    Alzheimer's disease Father    Colon cancer Maternal Grandfather    Stroke Paternal Grandfather    Breast cancer Sister    Breast cancer Maternal Aunt    Esophageal cancer Neg Hx    Stomach cancer Neg Hx    Rectal cancer Neg Hx     Allergies as of 01/07/2022   No Known Allergies      Medication List        Accurate as of January 07, 2022  3:51 PM. If you have any questions, ask your nurse or doctor.          Biotin 10 MG Caps Take by mouth.   busPIRone 7.5 MG tablet Commonly known as: BUSPAR TAKE 1 TABLET BY MOUTH 2 TIMES DAILY.   famotidine 20 MG tablet Commonly known as: PEPCID TAKE 1 TABLET BY  MOUTH TWICE A DAY   HAIR SKIN AND NAILS FORMULA PO Take 1 tablet by mouth daily at 12 noon.   levocetirizine 5 MG tablet Commonly known as: XYZAL TAKE 1 TABLET BY MOUTH EVERY DAY IN THE EVENING   valsartan 160 MG tablet Commonly known as: Diovan Take 1 tablet (160 mg total) by mouth daily.        BP (!) 142/86 (BP Location: Left Arm, Cuff Size: Normal)   Pulse 67   Temp 98 F (36.7 C)   Resp 18   Ht '5\' 7"'$  (1.702 m)   Wt 157 lb (71.2 kg)   LMP  (LMP Unknown)   SpO2 99%   BMI 24.59 kg/m  General: Awake, alert, appears stated age Eyes: PERRLA, EOMi Ears: Patent, TM's neg b/l Heart: RRR, no murmurs, no carotid bruits Lungs: CTAB, no accessory muscle use MSK: 5/5 strength throughout, gait normal Neuro: No cerebellar signs, patellar reflex 1/4 b/l wo clonus, biceps reflex 2/4 b/l wo clonus Psych: Age appropriate judgment and insight, normal mood and affect  Light headed - Plan: CBC, Comprehensive metabolic panel, TSH, VITAMIN D 25 Hydroxy (Vit-D Deficiency, Fractures)  Fatigue, unspecified type  Essential hypertension  Try to get hydration level to around 55 to 60 ounces water daily outside of  exercise.  Make sure to eat regularly.  Check above labs.  Could consider echo if no improvement and work-up unremarkable. I think this is likely due to #1 and also anxiety.  She prefer to hold off on any anxiety medication. Recheck of BP as tolerated.  Blood pressures at home ranging in the 120s-130s/70-80's.  She will continue valsartan 160 mg daily and monitor her blood pressures at home.  If greater than 140/90, she will follow-up with her regular PCP. F/u prn. Pt voiced understanding and agreement to the plan.  Spent 30 minutes with the patient discussing the above plan in addition to reviewing her chart on the same day of the visit.  [place], DO 01/07/22 3:51 PM

## 2022-01-08 ENCOUNTER — Encounter: Payer: Self-pay | Admitting: Family Medicine

## 2022-01-08 LAB — COMPREHENSIVE METABOLIC PANEL
ALT: 13 U/L (ref 0–35)
AST: 17 U/L (ref 0–37)
Albumin: 4.2 g/dL (ref 3.5–5.2)
Alkaline Phosphatase: 80 U/L (ref 39–117)
BUN: 24 mg/dL — ABNORMAL HIGH (ref 6–23)
CO2: 28 mEq/L (ref 19–32)
Calcium: 9.6 mg/dL (ref 8.4–10.5)
Chloride: 101 mEq/L (ref 96–112)
Creatinine, Ser: 0.83 mg/dL (ref 0.40–1.20)
GFR: 78.27 mL/min (ref >60.00)
Glucose, Bld: 90 mg/dL (ref 70–99)
Potassium: 4.3 mEq/L (ref 3.5–5.1)
Sodium: 138 mEq/L (ref 135–145)
Total Bilirubin: 0.3 mg/dL (ref 0.2–1.2)
Total Protein: 7 g/dL (ref 6.0–8.3)

## 2022-01-08 LAB — CBC
HCT: 39.1 % (ref 36.0–46.0)
Hemoglobin: 13 g/dL (ref 12.0–15.0)
MCHC: 33.1 g/dL (ref 30.0–36.0)
MCV: 87.9 fl (ref 78.0–100.0)
Platelets: 149 10*3/uL — ABNORMAL LOW (ref 150.0–400.0)
RBC: 4.45 Mil/uL (ref 3.87–5.11)
RDW: 13.2 % (ref 11.5–15.5)
WBC: 4.3 10*3/uL (ref 4.0–10.5)

## 2022-01-08 LAB — TSH: TSH: 0.73 u[IU]/mL (ref 0.35–5.50)

## 2022-01-08 LAB — VITAMIN D 25 HYDROXY (VIT D DEFICIENCY, FRACTURES): VITD: 22.44 ng/mL — ABNORMAL LOW (ref 30.00–100.00)

## 2022-03-23 ENCOUNTER — Other Ambulatory Visit: Payer: Self-pay | Admitting: Family

## 2022-03-23 ENCOUNTER — Ambulatory Visit: Payer: BC Managed Care – PPO | Admitting: Family

## 2022-03-23 ENCOUNTER — Encounter: Payer: Self-pay | Admitting: Family

## 2022-03-23 VITALS — BP 114/72 | HR 80 | Temp 99.0°F | Resp 18 | Ht 67.0 in | Wt 160.0 lb

## 2022-03-23 DIAGNOSIS — R051 Acute cough: Secondary | ICD-10-CM

## 2022-03-23 DIAGNOSIS — J069 Acute upper respiratory infection, unspecified: Secondary | ICD-10-CM | POA: Diagnosis not present

## 2022-03-23 LAB — POCT INFLUENZA A/B
Influenza A, POC: NEGATIVE
Influenza B, POC: NEGATIVE

## 2022-03-23 MED ORDER — FLUTICASONE PROPIONATE 50 MCG/ACT NA SUSP: 2.0000 | Freq: Every day | NASAL | 6 refills | Status: DC

## 2022-03-23 NOTE — Progress Notes (Signed)
Erica [name] is a 56 y.o. female with the following history as recorded in [employer]:  Patient Active Problem List   Diagnosis Date Noted   Chronic constipation 09/24/2021   Anxiety 09/24/2021   Family history of malignant neoplasm of breast 09/24/2021   Iron deficiency anemia 09/24/2021   Menorrhagia 09/24/2021   Skin sensation disturbance 09/24/2021   HH (hiatus hernia) 09/22/2021   GERD (gastroesophageal reflux disease) 09/22/2021   Fibroids 09/22/2021   Chest pain of uncertain etiology 31/51/7616   Liver hemangioma, giant, incidental finding 09/06/2018   Dyspepsia 07/10/2018   Osteopenia 11/26/2016   Vitamin D deficiency 11/26/2016   Hx of adenomatous colonic polyps 09/29/2016   Essential hypertension 08/04/2006   IBS 08/04/2006    Current Outpatient Medications  Medication Sig Dispense Refill   Biotin 10 MG CAPS Take by mouth.     busPIRone (BUSPAR) 7.5 MG tablet TAKE 1 TABLET BY MOUTH 2 TIMES DAILY. 180 tablet 1   famotidine (PEPCID) 20 MG tablet TAKE 1 TABLET BY MOUTH TWICE A DAY 180 tablet 1   fluticasone (FLONASE) 50 MCG/ACT nasal spray Place 2 sprays into both nostrils daily. 16 g 6   levocetirizine (XYZAL) 5 MG tablet TAKE 1 TABLET BY MOUTH EVERY DAY IN THE EVENING 90 tablet 3   Multiple Vitamins-Minerals (HAIR SKIN AND NAILS FORMULA PO) Take 1 tablet by mouth daily at 12 noon.     valsartan (DIOVAN) 160 MG tablet Take 1 tablet (160 mg total) by mouth daily. 90 tablet 3   No current facility-administered medications for this visit.    Allergies: Patient has no known allergies.  Past Medical History:  Diagnosis Date   Anemia    Depression    Essential hypertension, benign    Fibroids    GERD (gastroesophageal reflux disease)    HH (hiatus hernia)    Hx of adenomatous colonic polyps    IBS (irritable bowel syndrome)    Osteopenia 10/2016   SUI (stress urinary incontinence, female) 09/29/2016    Past Surgical History:  Procedure Laterality Date    COLONOSCOPY  01/01/2005   Normal    POLYPECTOMY     UTERINE FIBROID EMBOLIZATION      Family History  Problem Relation Age of Onset   Hodgkin's lymphoma Mother    Alzheimer's disease Father    Colon cancer Maternal Grandfather    Stroke Paternal Grandfather    Breast cancer Sister    Breast cancer Maternal Aunt    Esophageal cancer Neg Hx    Stomach cancer Neg Hx    Rectal cancer Neg Hx     Social History   Tobacco Use   Smoking status: Never    Passive exposure: Never   Smokeless tobacco: Never  Substance Use Topics   Alcohol use: No    Alcohol/week: 0.0 standard drinks of alcohol    Comment: occ    Subjective:  2-3 day history of cough/ congestion; + sore throat; no chest pain or shortness of breath; has been taking OTC Mucinex and has continued with daily Xyzal;  Negative COVID test yesterday;   Objective:  Vitals:   03/23/22 1032  BP: 114/72  Pulse: 80  Resp: 18  Temp: 99 F (37.2 C)  TempSrc: Oral  SpO2: 99%  Weight: 160 lb (72.6 kg)  Height: '5\' 7"'$  (1.702 m)    General: Well developed, well nourished, in no acute distress  Skin : Warm and dry.  Head: Normocephalic and atraumatic  Eyes: Sclera and  conjunctiva clear; pupils round and reactive to light; extraocular movements intact  Ears: External normal; canals clear; tympanic membranes normal  Oropharynx: Pink, supple. No suspicious lesions  Neck: Supple without thyromegaly, adenopathy  Lungs: Respirations unlabored; clear to auscultation bilaterally without wheeze, rales, rhonchi  [facility] exam: normal rate and regular rhythm.  Neurologic: Alert and oriented; speech intact; face symmetrical; moves all extremities well; CNII-XII intact without focal deficit   Assessment:  1. Viral URI with cough   2. Acute cough     Plan:  Physical exam is reassuring; symptomatic treatment discussed; continue Mucinex and Xyzal; add Flonase NS; increase fluids, rest; follow up worse, no better.   No follow-ups on file.   Orders Placed This Encounter  Procedures   POCT Influenza A/B    Requested Prescriptions   Signed Prescriptions Disp Refills   fluticasone (FLONASE) 50 MCG/ACT nasal spray 16 g 6    Sig: Place 2 sprays into both nostrils daily.

## 2022-03-24 ENCOUNTER — Telehealth: Payer: Self-pay | Admitting: Medical

## 2022-03-24 ENCOUNTER — Other Ambulatory Visit: Payer: Self-pay | Admitting: Family

## 2022-03-24 MED ORDER — MOLNUPIRAVIR EUA 200MG CAPSULE: 4.0000 | ORAL_CAPSULE | Freq: Two times a day (BID) | ORAL | 0 refills | Status: AC

## 2022-03-24 NOTE — Telephone Encounter (Signed)
Pt would like to have the paxlovid sent to the following pharmacy:  [facility], [place], [place] [address] P: [phone]

## 2022-03-24 NOTE — Telephone Encounter (Signed)
Pt was seen by Mickel Baas yesterday and did tested negative for Covid and flu. She retested for Covid today and is now positive. Sxs started Saturday or Sunday. She cannot smell or hear and has very bad chills. Please advise pt if she is able to get antiviral.   [id number] [id number]: [phone] Fax: [phone]

## 2022-03-24 NOTE — Telephone Encounter (Signed)
Pt called back. Stated her brother gets off at 2 and can pick it up for her.

## 2022-03-24 NOTE — Telephone Encounter (Signed)
PAtient would like to know if Erica [name] can fill her Covid medication since Mickel Baas (who she saw yesterday) is not in the office. Please advise.

## 2022-03-25 NOTE — Telephone Encounter (Signed)
Patient did pick up already picked up medication.

## 2022-03-29 NOTE — Telephone Encounter (Signed)
Pt stated she was given a work note that she has an upper respiratory infection before she tested positive for Covid. She would like to know if she can get another work note stating that she does have covid. This can be uploaded in [place].

## 2022-03-30 ENCOUNTER — Encounter: Payer: Self-pay | Admitting: Family

## 2022-03-30 ENCOUNTER — Other Ambulatory Visit: Payer: Self-pay | Admitting: Family

## 2022-03-30 NOTE — Telephone Encounter (Signed)
Pt called to follow up on note status. Advised pt that with request being submitted during the late part of the day, they may not have gotten to it quite yet and to give them a little more time. Pt acknowledged understanding.

## 2022-04-02 NOTE — Telephone Encounter (Signed)
Patient states she is still feeling some chest pressure due to the congestion, and is unsure if she should be taking more medication or to just let it go. Please advise.

## 2022-04-02 NOTE — Telephone Encounter (Signed)
Molnupiravir sent by Mickel Baas.

## 2022-04-06 NOTE — Telephone Encounter (Signed)
Pt called and lvm to return call to schedule an appt

## 2022-04-14 ENCOUNTER — Telehealth: Payer: Self-pay | Admitting: Medical

## 2022-04-14 NOTE — Telephone Encounter (Signed)
Please advise pt does have an upcoming appt on 04/26/22

## 2022-04-14 NOTE — Telephone Encounter (Signed)
Pt called stating she is still having issue with hearing post-COVID. Pt would like to know what to do next to help treating this issue. Please Advise.

## 2022-04-14 NOTE — Telephone Encounter (Signed)
Pt called and lvm to return call to move appt sooner

## 2022-04-16 ENCOUNTER — Ambulatory Visit: Payer: BC Managed Care – PPO | Admitting: Medical

## 2022-04-16 VITALS — BP 140/76 | HR 67 | Resp 18 | Ht 67.0 in | Wt 168.0 lb

## 2022-04-16 DIAGNOSIS — H6123 Impacted cerumen, bilateral: Secondary | ICD-10-CM | POA: Diagnosis not present

## 2022-04-16 DIAGNOSIS — Q819 Epidermolysis bullosa, unspecified: Secondary | ICD-10-CM | POA: Diagnosis not present

## 2022-04-16 DIAGNOSIS — H6992 Unspecified Eustachian tube disorder, left ear: Secondary | ICD-10-CM | POA: Diagnosis not present

## 2022-04-16 DIAGNOSIS — Z0000 Encounter for general adult medical examination without abnormal findings: Secondary | ICD-10-CM | POA: Diagnosis not present

## 2022-04-16 NOTE — Patient Instructions (Addendum)
For you wellness exam today I have ordered cbc, cmp and  lipid panel.  Flu vaccine deferred until follow up.  Recommend exercise and healthy diet.  We will let you know lab results as they come in.  Follow up date appointment will be determined after lab review.    Eustachian tube dysfunction left side post viral illness with both canals blocked with wax. Recommend using flonase 2 sprays each nostril daily and use debrox to canals 3-4 times daily(use for 5-7 days before follow up.)  You report some cracked nails recently. Currently nails are short. On follow up if nails long enough may cut porion of nail to do koh test.  Follow up in 10-14 days.   Preventive Care 58-78 Years Old, Female Preventive care refers to lifestyle choices and visits with your health care provider that can promote health and wellness. Preventive care visits are also called wellness exams. What can I expect for my preventive care visit? Counseling Your health care provider may ask you questions about your: Medical history, including: Past medical problems. Family medical history. Pregnancy history. Current health, including: Menstrual cycle. Method of birth control. Emotional well-being. Home life and relationship well-being. Sexual activity and sexual health. Lifestyle, including: Alcohol, nicotine or tobacco, and drug use. Access to firearms. Diet, exercise, and sleep habits. Work and work Statistician. Sunscreen use. Safety issues such as seatbelt and bike helmet use. Physical exam Your health care provider will check your: Height and weight. These may be used to calculate your BMI (body mass index). BMI is a measurement that tells if you are at a healthy weight. Waist circumference. This measures the distance around your waistline. This measurement also tells if you are at a healthy weight and may help predict your risk of certain diseases, such as type 2 diabetes and high blood pressure. Heart rate  and blood pressure. Body temperature. Skin for abnormal spots. What immunizations do I need?  Vaccines are usually given at various ages, according to a schedule. Your health care provider will recommend vaccines for you based on your age, medical history, and lifestyle or other factors, such as travel or where you work. What tests do I need? Screening Your health care provider may recommend screening tests for certain conditions. This may include: Lipid and cholesterol levels. Diabetes screening. This is done by checking your blood sugar (glucose) after you have not eaten for a while (fasting). Pelvic exam and Pap test. Hepatitis B test. Hepatitis C test. HIV (human immunodeficiency virus) test. STI (sexually transmitted infection) testing, if you are at risk. Lung cancer screening. Colorectal cancer screening. Mammogram. Talk with your health care provider about when you should start having regular mammograms. This may depend on whether you have a family history of breast cancer. BRCA-related cancer screening. This may be done if you have a family history of breast, ovarian, tubal, or peritoneal cancers. Bone density scan. This is done to screen for osteoporosis. Talk with your health care provider about your test results, treatment options, and if necessary, the need for more tests. Follow these instructions at home: Eating and drinking  Eat a diet that includes fresh fruits and vegetables, whole grains, lean protein, and low-fat dairy products. Take vitamin and mineral supplements as recommended by your health care provider. Do not drink alcohol if: Your health care provider tells you not to drink. You are pregnant, may be pregnant, or are planning to become pregnant. If you drink alcohol: Limit how much you have to 0-1  drink a day. Know how much alcohol is in your drink. In the [place] one drink equals one 12 oz bottle of beer (355 mL), one 5 oz glass of wine (148 mL), or one 1 oz  glass of hard liquor (44 mL). Lifestyle Brush your teeth every morning and night with fluoride toothpaste. Floss one time each day. Exercise for at least 30 minutes 5 or more days each week. Do not use any products that contain nicotine or tobacco. These products include cigarettes, chewing tobacco, and vaping devices, such as e-cigarettes. If you need help quitting, ask your health care provider. Do not use drugs. If you are sexually active, practice safe sex. Use a condom or other form of protection to prevent STIs. If you do not wish to become pregnant, use a form of birth control. If you plan to become pregnant, see your health care provider for a prepregnancy visit. Take aspirin only as told by your health care provider. Make sure that you understand how much to take and what form to take. Work with your health care provider to find out whether it is safe and beneficial for you to take aspirin daily. Find healthy ways to manage stress, such as: Meditation, yoga, or listening to music. Journaling. Talking to a trusted person. Spending time with friends and family. Minimize exposure to UV radiation to reduce your risk of skin cancer. Safety Always wear your seat belt while driving or riding in a vehicle. Do not drive: If you have been drinking alcohol. Do not ride with someone who has been drinking. When you are tired or distracted. While texting. If you have been using any mind-altering substances or drugs. Wear a helmet and other protective equipment during sports activities. If you have firearms in your house, make sure you follow all gun safety procedures. Seek help if you have been physically or sexually abused. What's next? Visit your health care provider once a year for an annual wellness visit. Ask your health care provider how often you should have your eyes and teeth checked. Stay up to date on all vaccines. This information is not intended to replace advice given to you by  your health care provider. Make sure you discuss any questions you have with your health care provider. Document Revised: 09/24/2020 Document Reviewed: 09/24/2020 Elsevier Patient Education  [place].

## 2022-04-16 NOTE — Progress Notes (Signed)
Subjective:    Patient ID: Erica [name], female    DOB: Aug 12, 1965, 57 y.o.   MRN: [id number]  HPI  Pt in for follow up.  Also has wellness exam form. So will do physical today and get labs done fasting.  Pt declines flu vaccine today. Will get later on follow up.   Pt up to date on colonoscopy. Up to date on mammogram and pap.  She had middle of the month. She did get molnupiravir before end of 5 day treatment.   Pt had fever, chills, fatigue and runny nose early on.   Pt presently has some left ear pressure. She states her energy is back to normal.     Review of Systems  Constitutional:  Negative for chills, fatigue and fever.  HENT:  Negative for congestion, ear pain, sinus pain and sore throat.        More presssure. See hpi.  Respiratory:  Negative for chest tightness, shortness of breath and wheezing.   Cardiovascular:  Negative for chest pain and palpitations.  Gastrointestinal:  Negative for abdominal pain.  Genitourinary:  Negative for dyspareunia, dysuria, flank pain, genital sores and menstrual problem.  Musculoskeletal:  Negative for back pain, myalgias and neck stiffness.  Skin:  Negative for pallor and rash.  Neurological:  Negative for dizziness, speech difficulty, weakness, numbness and headaches.  Hematological:  Negative for adenopathy. Does not bruise/bleed easily.  Psychiatric/Behavioral:  Negative for behavioral problems, decreased concentration and self-injury. The patient is not nervous/anxious.     Past Medical History:  Diagnosis Date   Anemia    Depression    Essential hypertension, benign    Fibroids    GERD (gastroesophageal reflux disease)    HH (hiatus hernia)    Hx of adenomatous colonic polyps    IBS (irritable bowel syndrome)    Osteopenia 10/2016   SUI (stress urinary incontinence, female) 09/29/2016     Social History   Socioeconomic History   Marital status: Single    Spouse name: Not on file   Number of children: 0    Years of education: Not on file   Highest education level: Not on file  Occupational History   Occupation: MI service specialist    Employer: [employer]  Tobacco Use   Smoking status: Never    Passive exposure: Never   Smokeless tobacco: Never  Vaping Use   Vaping Use: Never used  Substance and Sexual Activity   Alcohol use: No    Alcohol/week: 0.0 standard drinks of alcohol    Comment: occ   Drug use: No   Sexual activity: Not Currently    Birth control/protection: Abstinence, Post-menopausal  Other Topics Concern   Not on file  Social History Narrative   Not on file   Social Determinants of Health   Financial Resource Strain: Not on file  Food Insecurity: Not on file  Transportation Needs: Not on file  Physical Activity: Not on file  Stress: Not on file  Social Connections: Not on file  Intimate Partner Violence: Not on file    Past Surgical History:  Procedure Laterality Date   COLONOSCOPY  01/01/2005   Normal    POLYPECTOMY     UTERINE FIBROID EMBOLIZATION      Family History  Problem Relation Age of Onset   Hodgkin's lymphoma Mother    Alzheimer's disease Father    Colon cancer Maternal Grandfather    Stroke Paternal Grandfather    Breast cancer Sister  Breast cancer Maternal Aunt    Esophageal cancer Neg Hx    Stomach cancer Neg Hx    Rectal cancer Neg Hx     No Known Allergies  Current Outpatient Medications on File Prior to Visit  Medication Sig Dispense Refill   Biotin 10 MG CAPS Take by mouth.     busPIRone (BUSPAR) 7.5 MG tablet TAKE 1 TABLET BY MOUTH 2 TIMES DAILY. 180 tablet 1   famotidine (PEPCID) 20 MG tablet TAKE 1 TABLET BY MOUTH TWICE A DAY 180 tablet 1   fluticasone (FLONASE) 50 MCG/ACT nasal spray Place 2 sprays into both nostrils daily. 16 g 6   levocetirizine (XYZAL) 5 MG tablet TAKE 1 TABLET BY MOUTH EVERY DAY IN THE EVENING 90 tablet 3   Multiple Vitamins-Minerals (HAIR SKIN AND NAILS FORMULA PO) Take 1 tablet by mouth  daily at 12 noon.     valsartan (DIOVAN) 160 MG tablet Take 1 tablet (160 mg total) by mouth daily. 90 tablet 3   No current facility-administered medications on file prior to visit.    BP (!) 140/76   Pulse 67   Resp 18   Ht '5\' 7"'$  (1.702 m)   Wt 168 lb (76.2 kg)   LMP  (LMP Unknown)   SpO2 100%   BMI 26.31 kg/m        Objective:   Physical Exam  Past Medical History:  Diagnosis Date   Anemia    Depression    Essential hypertension, benign    Fibroids    GERD (gastroesophageal reflux disease)    HH (hiatus hernia)    Hx of adenomatous colonic polyps    IBS (irritable bowel syndrome)    Osteopenia 10/2016   SUI (stress urinary incontinence, female) 09/29/2016     Social History   Socioeconomic History   Marital status: Single    Spouse name: Not on file   Number of children: 0   Years of education: Not on file   Highest education level: Not on file  Occupational History   Occupation: MI service specialist    Employer: [employer]  Tobacco Use   Smoking status: Never    Passive exposure: Never   Smokeless tobacco: Never  Vaping Use   Vaping Use: Never used  Substance and Sexual Activity   Alcohol use: No    Alcohol/week: 0.0 standard drinks of alcohol    Comment: occ   Drug use: No   Sexual activity: Not Currently    Birth control/protection: Abstinence, Post-menopausal  Other Topics Concern   Not on file  Social History Narrative   Not on file   Social Determinants of Health   Financial Resource Strain: Not on file  Food Insecurity: Not on file  Transportation Needs: Not on file  Physical Activity: Not on file  Stress: Not on file  Social Connections: Not on file  Intimate Partner Violence: Not on file    Past Surgical History:  Procedure Laterality Date   COLONOSCOPY  01/01/2005   Normal    POLYPECTOMY     UTERINE FIBROID EMBOLIZATION      Family History  Problem Relation Age of Onset   Hodgkin's lymphoma Mother    Alzheimer's  disease Father    Colon cancer Maternal Grandfather    Stroke Paternal Grandfather    Breast cancer Sister    Breast cancer Maternal Aunt    Esophageal cancer Neg Hx    Stomach cancer Neg Hx    Rectal cancer  Neg Hx     No Known Allergies  Current Outpatient Medications on File Prior to Visit  Medication Sig Dispense Refill   Biotin 10 MG CAPS Take by mouth.     busPIRone (BUSPAR) 7.5 MG tablet TAKE 1 TABLET BY MOUTH 2 TIMES DAILY. 180 tablet 1   famotidine (PEPCID) 20 MG tablet TAKE 1 TABLET BY MOUTH TWICE A DAY 180 tablet 1   fluticasone (FLONASE) 50 MCG/ACT nasal spray Place 2 sprays into both nostrils daily. 16 g 6   levocetirizine (XYZAL) 5 MG tablet TAKE 1 TABLET BY MOUTH EVERY DAY IN THE EVENING 90 tablet 3   Multiple Vitamins-Minerals (HAIR SKIN AND NAILS FORMULA PO) Take 1 tablet by mouth daily at 12 noon.     valsartan (DIOVAN) 160 MG tablet Take 1 tablet (160 mg total) by mouth daily. 90 tablet 3   No current facility-administered medications on file prior to visit.      BP (!) 140/76   Pulse 67   Resp 18   Ht '5\' 7"'$  (1.702 m)   Wt 168 lb (76.2 kg)   LMP  (LMP Unknown)   SpO2 100%   BMI 26.31 kg/m    General- No acute distress. Pleasant patient. Neck- Full range of motion, no jvd Lungs- Clear, even and unlabored. Heart- regular rate and rhythm. Neurologic- CNII- XII grossly intact.  Heent- canals blocked with cerumen. Can't see tm. No sinus pressure.       Assessment & Plan:    Patient Instructions  For you wellness exam today I have ordered cbc, cmp and  lipid panel.  Flu vaccine deferred until follow up.  Recommend exercise and healthy diet.  We will let you know lab results as they come in.  Follow up date appointment will be determined after lab review.     Eustachian tube dysfunction left side post viral illness with both canals blocked with wax. Recommend using flonase 2 sprays each nostril daily and use debrox to canals 3-4 times daily(use  for 5-7 days before follow up.)  You report some cracked nails recently. Currently nails are short. On follow up if nails long enough may cut porion of nail to do koh test.  Follow up in 10-14 days.     Mackie Pai, PA-C

## 2022-04-26 ENCOUNTER — Ambulatory Visit: Payer: BC Managed Care – PPO | Admitting: Medical

## 2022-04-26 VITALS — BP 128/80 | HR 70 | Resp 18 | Ht 67.0 in | Wt 158.0 lb

## 2022-04-26 DIAGNOSIS — Z0000 Encounter for general adult medical examination without abnormal findings: Secondary | ICD-10-CM

## 2022-04-26 DIAGNOSIS — H6123 Impacted cerumen, bilateral: Secondary | ICD-10-CM

## 2022-04-26 DIAGNOSIS — L603 Nail dystrophy: Secondary | ICD-10-CM

## 2022-04-26 LAB — COMPREHENSIVE METABOLIC PANEL
ALT: 13 U/L (ref 0–35)
AST: 17 U/L (ref 0–37)
Albumin: 4.1 g/dL (ref 3.5–5.2)
Alkaline Phosphatase: 71 U/L (ref 39–117)
BUN: 18 mg/dL (ref 6–23)
CO2: 28 mEq/L (ref 19–32)
Calcium: 9.4 mg/dL (ref 8.4–10.5)
Chloride: 105 mEq/L (ref 96–112)
Creatinine, Ser: 0.86 mg/dL (ref 0.40–1.20)
GFR: 74.85 mL/min (ref >60.00)
Glucose, Bld: 93 mg/dL (ref 70–99)
Potassium: 4 mEq/L (ref 3.5–5.1)
Sodium: 139 mEq/L (ref 135–145)
Total Bilirubin: 0.4 mg/dL (ref 0.2–1.2)
Total Protein: 6.7 g/dL (ref 6.0–8.3)

## 2022-04-26 LAB — CBC WITH DIFFERENTIAL/PLATELET
Basophils Absolute: 0 10*3/uL (ref 0.0–0.1)
Basophils Relative: 0.4 % (ref 0.0–3.0)
Eosinophils Absolute: 0 10*3/uL (ref 0.0–0.7)
Eosinophils Relative: 0.8 % (ref 0.0–5.0)
HCT: 41 % (ref 36.0–46.0)
Hemoglobin: 13.7 g/dL (ref 12.0–15.0)
Lymphocytes Relative: 45.1 % (ref 12.0–46.0)
Lymphs Abs: 1.6 10*3/uL (ref 0.7–4.0)
MCHC: 33.3 g/dL (ref 30.0–36.0)
MCV: 88.4 fl (ref 78.0–100.0)
Monocytes Absolute: 0.3 10*3/uL (ref 0.1–1.0)
Monocytes Relative: 9.5 % (ref 3.0–12.0)
Neutro Abs: 1.5 10*3/uL (ref 1.4–7.7)
Neutrophils Relative %: 44.2 % (ref 43.0–77.0)
Platelets: 148 10*3/uL — ABNORMAL LOW (ref 150.0–400.0)
RBC: 4.64 Mil/uL (ref 3.87–5.11)
RDW: 13.5 % (ref 11.5–15.5)
WBC: 3.4 10*3/uL — ABNORMAL LOW (ref 4.0–10.5)

## 2022-04-26 LAB — LIPID PANEL
Cholesterol: 189 mg/dL (ref 0–200)
HDL: 74.3 mg/dL (ref >39.00)
LDL Cholesterol: 104 mg/dL — ABNORMAL HIGH (ref 0–99)
NonHDL: 114.51
Total CHOL/HDL Ratio: 3
Triglycerides: 53 mg/dL (ref 0.0–149.0)
VLDL: 10.6 mg/dL (ref 0.0–40.0)

## 2022-04-26 NOTE — Progress Notes (Signed)
Subjective:    Patient ID: Erica [name], female    DOB: 1965/05/02, 57 y.o.   MRN: [id number]  HPI  Pt in for follow for follow up. She reported some left ear pressure. This was post covid at time I saw her.   On A/P  "Eustachian tube dysfunction left side post viral illness with both canals blocked with wax. Recommend using flonase 2 sprays each nostril daily and use debrox to canals 3-4 times daily(use for 5-7 days before follow up.)"   Pt states with debrox use and the above her ear feels normal.   No ear pain. No sinus pain.  Also follow up on patient cracked dysmorphic nails.     Review of Systems  Constitutional:  Negative for chills, fatigue and fever.  HENT:  Negative for congestion, dental problem and ear discharge.   Respiratory:  Negative for chest tightness, shortness of breath and wheezing.   Cardiovascular:  Negative for chest pain and palpitations.  Gastrointestinal:  Negative for abdominal distention, abdominal pain, constipation and diarrhea.  Genitourinary:  Negative for dysuria.  Musculoskeletal:  Negative for back pain and joint swelling.  Neurological:  Negative for dizziness, numbness and headaches.  Hematological:  Negative for adenopathy. Does not bruise/bleed easily.    Past Medical History:  Diagnosis Date   Anemia    Depression    Essential hypertension, benign    Fibroids    GERD (gastroesophageal reflux disease)    HH (hiatus hernia)    Hx of adenomatous colonic polyps    IBS (irritable bowel syndrome)    Osteopenia 10/2016   SUI (stress urinary incontinence, female) 09/29/2016     Social History   Socioeconomic History   Marital status: Single    Spouse name: Not on file   Number of children: 0   Years of education: Not on file   Highest education level: Not on file  Occupational History   Occupation: MI service specialist    Employer: [employer]  Tobacco Use   Smoking status: Never    Passive exposure: Never    Smokeless tobacco: Never  Vaping Use   Vaping Use: Never used  Substance and Sexual Activity   Alcohol use: No    Alcohol/week: 0.0 standard drinks of alcohol    Comment: occ   Drug use: No   Sexual activity: Not Currently    Birth control/protection: Abstinence, Post-menopausal  Other Topics Concern   Not on file  Social History Narrative   Not on file   Social Determinants of Health   Financial Resource Strain: Not on file  Food Insecurity: Not on file  Transportation Needs: Not on file  Physical Activity: Not on file  Stress: Not on file  Social Connections: Not on file  Intimate Partner Violence: Not on file    Past Surgical History:  Procedure Laterality Date   COLONOSCOPY  01/01/2005   Normal    POLYPECTOMY     UTERINE FIBROID EMBOLIZATION      Family History  Problem Relation Age of Onset   Hodgkin's lymphoma Mother    Alzheimer's disease Father    Colon cancer Maternal Grandfather    Stroke Paternal Grandfather    Breast cancer Sister    Breast cancer Maternal Aunt    Esophageal cancer Neg Hx    Stomach cancer Neg Hx    Rectal cancer Neg Hx     No Known Allergies  Current Outpatient Medications on File Prior to Visit  Medication  Sig Dispense Refill   Biotin 10 MG CAPS Take by mouth.     busPIRone (BUSPAR) 7.5 MG tablet TAKE 1 TABLET BY MOUTH 2 TIMES DAILY. 180 tablet 1   famotidine (PEPCID) 20 MG tablet TAKE 1 TABLET BY MOUTH TWICE A DAY 180 tablet 1   fluticasone (FLONASE) 50 MCG/ACT nasal spray Place 2 sprays into both nostrils daily. 16 g 6   levocetirizine (XYZAL) 5 MG tablet TAKE 1 TABLET BY MOUTH EVERY DAY IN THE EVENING 90 tablet 3   Multiple Vitamins-Minerals (HAIR SKIN AND NAILS FORMULA PO) Take 1 tablet by mouth daily at 12 noon.     valsartan (DIOVAN) 160 MG tablet Take 1 tablet (160 mg total) by mouth daily. 90 tablet 3   No current facility-administered medications on file prior to visit.    BP 128/80   Pulse 70   Resp 18   Ht 5'  7" (1.702 m)   Wt 158 lb (71.7 kg)   LMP  (LMP Unknown)   SpO2 100%   BMI 24.75 kg/m        Objective:   Physical Exam General- No acute distress. Pleasant patient.   Heent- canals blocked with cerumen. Can't see tm. No sinus pressure.   Left hand- nails mild thick and dystrophic. Left 3rd finger nail mild friable appearance.        Assessment & Plan:   Patient Instructions  Bilateral cerumen impaction cleared/close to 100%. Only scant flaky wax present. Can use debrox in future as we discussed. I do think had both wax and eustachian tube dysfunction.   For dystropic nails I took sample of nails and will send out for koh study. If come back + treat for fungus.  Will wait for labs to be done from last visit wellness exam.  Follow up as needed.   Mackie Pai, PA-C

## 2022-04-26 NOTE — Patient Instructions (Signed)
Bilateral cerumen impaction cleared/close to 100%. Only scant flaky wax present. Can use debrox in future as we discussed. I do think had both wax and eustachian tube dysfunction.   For dystropic nails I took sample of nails and will send out for koh study. If come back + treat for fungus.  Will wait for labs to be done from last visit wellness exam.  Follow up as needed.

## 2022-04-28 LAB — FUNGAL STAIN
FUNGAL SMEAR:: NONE SEEN
MICRO NUMBER:: 14431502
SPECIMEN QUALITY:: ADEQUATE

## 2022-04-29 NOTE — Addendum Note (Signed)
Addended by: Anabel Halon on: 04/29/2022 06:48 PM   Modules accepted: Orders

## 2022-05-10 ENCOUNTER — Telehealth: Payer: Self-pay | Admitting: Medical

## 2022-05-10 NOTE — Telephone Encounter (Signed)
Form faxed

## 2022-05-10 NOTE — Telephone Encounter (Signed)
Patient called to follow up on the work paperwork she dropped off when she was here on 04/26/2022 to see if it has been completed. She said she is okay to drop off another form if needed. Please advise patient.

## 2022-06-07 DIAGNOSIS — L608 Other nail disorders: Secondary | ICD-10-CM | POA: Diagnosis not present

## 2022-07-01 DIAGNOSIS — M79671 Pain in right foot: Secondary | ICD-10-CM | POA: Diagnosis not present

## 2022-07-01 DIAGNOSIS — M2042 Other hammer toe(s) (acquired), left foot: Secondary | ICD-10-CM | POA: Diagnosis not present

## 2022-07-01 DIAGNOSIS — M2041 Other hammer toe(s) (acquired), right foot: Secondary | ICD-10-CM | POA: Diagnosis not present

## 2022-07-01 DIAGNOSIS — M79672 Pain in left foot: Secondary | ICD-10-CM | POA: Diagnosis not present

## 2022-07-01 DIAGNOSIS — M2011 Hallux valgus (acquired), right foot: Secondary | ICD-10-CM | POA: Diagnosis not present

## 2022-07-01 DIAGNOSIS — M2012 Hallux valgus (acquired), left foot: Secondary | ICD-10-CM | POA: Diagnosis not present

## 2022-08-13 ENCOUNTER — Encounter: Payer: Self-pay | Admitting: Family Medicine

## 2022-08-13 ENCOUNTER — Ambulatory Visit: Payer: BC Managed Care – PPO | Admitting: Family

## 2022-08-13 ENCOUNTER — Ambulatory Visit: Payer: BC Managed Care – PPO | Admitting: Family Medicine

## 2022-08-13 VITALS — BP 137/88 | HR 69 | Temp 97.9°F | Ht 67.0 in | Wt 158.0 lb

## 2022-08-13 DIAGNOSIS — K219 Gastro-esophageal reflux disease without esophagitis: Secondary | ICD-10-CM | POA: Diagnosis not present

## 2022-08-13 DIAGNOSIS — J069 Acute upper respiratory infection, unspecified: Secondary | ICD-10-CM | POA: Diagnosis not present

## 2022-08-13 MED ORDER — FAMOTIDINE 20 MG PO TABS: 20.0000 mg | ORAL_TABLET | Freq: Two times a day (BID) | ORAL | 1 refills | Status: DC

## 2022-08-13 NOTE — Progress Notes (Signed)
Acute Office Visit  Subjective:     Patient ID: Erica [name], female    DOB: 1965-11-14, 57 y.o.   MRN: [id number]  Chief Complaint  Patient presents with   Nasal Congestion    HPI Patient is in today for upper respiratory symptoms.  Patient reports that about 7 to 8 days ago she started having upper respiratory symptoms including dry cough, fatigue, nasal congestion, rhinorrhea, headaches, sore throat.  States she did have 1 day of loss of taste/smell.  She started taking Mucinex about 2 days ago and has noticed significant improvement since then.  States she did have 1 day of some mild dyspnea and wheeze, feeling a little nasally.  Reports today is the best day she has felt and has had significant improvement.  She has not been taking her regular Xyzal or Flonase as she did not know what she could take with the Mucinex.  She has not been around any sick contacts.  She has had 2 negative COVID tests over the past several days. She denies fevers, chills, chest pain, shortness of breath, GI/GU symptoms.        ROS All review of systems negative except what is listed in the HPI      Objective:    BP 137/88   Pulse 69   Temp 97.9 F (36.6 C) (Oral)   Ht 5\' 7"  (1.702 m)   Wt 158 lb (71.7 kg)   LMP  (LMP Unknown)   SpO2 100%   BMI 24.75 kg/m    Physical Exam Vitals reviewed.  Constitutional:      Appearance: Normal appearance.  HENT:     Nose:     Right Turbinates: Swollen.     Left Turbinates: Swollen.     Mouth/Throat:     Mouth: Mucous membranes are moist.     Pharynx: Oropharynx is clear. No pharyngeal swelling, oropharyngeal exudate or posterior oropharyngeal erythema.     Tonsils: 0 on the right. 0 on the left.     Comments: Mild cobblestoning, PND Cardiovascular:     Rate and Rhythm: Normal rate and regular rhythm.     Pulses: Normal pulses.     Heart sounds: Normal heart sounds.  Pulmonary:     Effort: Pulmonary effort is normal.     Breath  sounds: Normal breath sounds.  Skin:    General: Skin is warm and dry.  Neurological:     Mental Status: She is alert and oriented to person, place, and time.  Psychiatric:        Mood and Affect: Mood normal.        Behavior: Behavior normal.        Thought Content: Thought content normal.        Judgment: Judgment normal.     No results found for any visits on 08/13/22.      Assessment & Plan:   Problem List Items Addressed This Visit   None Visit Diagnoses     Viral URI with cough    -  Primary Significant improvement the past few days Recommend continue Mucinex as needed and daily antihistamine and Flonase Continue supportive measures including rest, hydration, humidifier use, steam showers, warm compresses to sinuses, warm liquids with lemon and honey, and over-the-counter cough, cold, and analgesics as needed.  Patient aware of signs/symptoms requiring further/urgent evaluation.      Gastroesophageal reflux disease     No new concerns. Refill provided.    Relevant Medications  famotidine (PEPCID) 20 MG tablet       Meds ordered this encounter  Medications   famotidine (PEPCID) 20 MG tablet    Sig: Take 1 tablet (20 mg total) by mouth 2 (two) times daily.    Dispense:  180 tablet    Refill:  1    Return if symptoms worsen or fail to improve.  Clayborne Dana, NP

## 2022-09-09 ENCOUNTER — Other Ambulatory Visit: Payer: Self-pay | Admitting: Medical

## 2022-09-09 DIAGNOSIS — Z0000 Encounter for general adult medical examination without abnormal findings: Secondary | ICD-10-CM

## 2022-09-14 ENCOUNTER — Other Ambulatory Visit: Payer: Self-pay | Admitting: Family

## 2022-09-14 ENCOUNTER — Other Ambulatory Visit: Payer: Self-pay | Admitting: Cardiology

## 2022-10-09 ENCOUNTER — Other Ambulatory Visit: Payer: Self-pay | Admitting: Cardiology

## 2022-10-18 ENCOUNTER — Other Ambulatory Visit: Payer: Self-pay | Admitting: Cardiology

## 2022-10-21 ENCOUNTER — Other Ambulatory Visit: Payer: Self-pay | Admitting: Cardiology

## 2022-11-03 ENCOUNTER — Other Ambulatory Visit: Payer: Self-pay | Admitting: Cardiology

## 2022-11-03 ENCOUNTER — Telehealth: Payer: Self-pay | Admitting: Cardiology

## 2022-11-03 NOTE — Telephone Encounter (Signed)
*  STAT* If patient is at the pharmacy, call can be transferred to refill team.   1. Which medications need to be refilled? (please list name of each medication and dose if known)  valsartan (DIOVAN) 160 MG tablet   2. Would you like to learn more about the convenience, safety, & potential cost savings by using the [facility]? No      3. Are you open to using the [facility] (Type [facility]. No  ).   4. Which pharmacy/location (including street and city if local pharmacy) is medication to be sent? [id number] - [place], [place] - [address]   Do they need a 30 day or 90 day supply? 90

## 2022-11-04 ENCOUNTER — Other Ambulatory Visit: Payer: Self-pay | Admitting: Cardiology

## 2022-11-05 ENCOUNTER — Other Ambulatory Visit: Payer: Self-pay | Admitting: Cardiology

## 2022-11-15 ENCOUNTER — Ambulatory Visit: Payer: BC Managed Care – PPO

## 2022-11-15 MED ORDER — VALSARTAN 160 MG PO TABS: 160.0000 mg | ORAL_TABLET | Freq: Every day | ORAL | 0 refills | Status: DC

## 2022-11-15 NOTE — Telephone Encounter (Signed)
*  STAT* If patient is at the pharmacy, call can be transferred to refill team.   1. Which medications need to be refilled? (please list name of each medication and dose if known)   valsartan (DIOVAN) 160 MG tablet   2. Would you like to learn more about the convenience, safety, & potential cost savings by using the [facility]?    3. Are you open to using the [facility] (Type [facility]. ).   4. Which pharmacy/location (including street and city if local pharmacy) is medication to be sent to?  [id number] - [place], [place] - [address]   5. Do they need a 30 day or 90 day supply?   90 day  Patient stated she will need a 90 day supply if this medication as her insurance will only cover a 90 day supply.  Patient stated she still has some medication left.

## 2022-11-17 ENCOUNTER — Ambulatory Visit: Admission: RE | Admit: 2022-11-17 | Payer: BC Managed Care – PPO | Source: Ambulatory Visit

## 2022-11-17 DIAGNOSIS — Z0000 Encounter for general adult medical examination without abnormal findings: Secondary | ICD-10-CM

## 2022-11-17 DIAGNOSIS — Z1231 Encounter for screening mammogram for malignant neoplasm of breast: Secondary | ICD-10-CM | POA: Diagnosis not present

## 2022-11-30 ENCOUNTER — Telehealth: Payer: Self-pay | Admitting: Cardiology

## 2022-11-30 MED ORDER — VALSARTAN 160 MG PO TABS: 160.0000 mg | ORAL_TABLET | Freq: Every day | ORAL | 0 refills | Status: DC

## 2022-11-30 NOTE — Telephone Encounter (Signed)
*  STAT* If patient is at the pharmacy, call can be transferred to refill team.   1. Which medications need to be refilled? (please list name of each medication and dose if known) valsartan (DIOVAN) 160 MG tablet   2. Which pharmacy/location (including street and city if local pharmacy) is medication to be sent to?[id number] - [place], [place] - [address]   3. Do they need a 30 day or 90 day supply? 90 Day Supply  Pt is scheduled to see Dr. Tomie China on 09/19. Patient needs a 90 day supply for the insurance to cover the cost of the medication.

## 2022-12-06 ENCOUNTER — Telehealth: Payer: Self-pay | Admitting: Medical

## 2022-12-06 NOTE — Telephone Encounter (Signed)
Pt called back. She stated she is completely out of meds.

## 2022-12-06 NOTE — Telephone Encounter (Signed)
The patient called and requested that [name] send in the prescription for Valsartan (Diovan) 160 mg tablets. She mentioned having issues receiving the correct supply from cardiology and would prefer [name] to handle the prescription. She needs a 90-day supply so her insurance will cover it. Although she has informed her doctor of this need, they continue to send a 30-day supply, causing her to pay out of pocket. She hopes [name] can submit it correctly this time. Please advise Erica [name].

## 2022-12-07 NOTE — Telephone Encounter (Signed)
Spoke with pt made her aware of refill on 8/20 by cardiology and she needs to keep her appt on 9/19 for further refills.  Davis Gourd [facility] [employer] Student

## 2022-12-29 ENCOUNTER — Other Ambulatory Visit: Payer: Self-pay

## 2022-12-29 DIAGNOSIS — L821 Other seborrheic keratosis: Secondary | ICD-10-CM | POA: Insufficient documentation

## 2022-12-29 DIAGNOSIS — I10 Essential (primary) hypertension: Secondary | ICD-10-CM | POA: Insufficient documentation

## 2022-12-29 DIAGNOSIS — L578 Other skin changes due to chronic exposure to nonionizing radiation: Secondary | ICD-10-CM

## 2022-12-29 DIAGNOSIS — L814 Other melanin hyperpigmentation: Secondary | ICD-10-CM | POA: Insufficient documentation

## 2022-12-29 DIAGNOSIS — D1801 Hemangioma of skin and subcutaneous tissue: Secondary | ICD-10-CM | POA: Insufficient documentation

## 2022-12-29 HISTORY — DX: Other skin changes due to chronic exposure to nonionizing radiation: L57.8

## 2022-12-29 HISTORY — DX: Other seborrheic keratosis: L82.1

## 2022-12-29 HISTORY — DX: Other melanin hyperpigmentation: L81.4

## 2022-12-29 HISTORY — DX: Hemangioma of skin and subcutaneous tissue: D18.01

## 2022-12-30 ENCOUNTER — Encounter: Payer: Self-pay | Admitting: Cardiology

## 2022-12-30 ENCOUNTER — Ambulatory Visit: Payer: BC Managed Care – PPO | Attending: Cardiology | Admitting: Cardiology

## 2022-12-30 VITALS — BP 148/80 | HR 71 | Ht 67.6 in | Wt 155.1 lb

## 2022-12-30 DIAGNOSIS — E785 Hyperlipidemia, unspecified: Secondary | ICD-10-CM

## 2022-12-30 DIAGNOSIS — I10 Essential (primary) hypertension: Secondary | ICD-10-CM

## 2022-12-30 DIAGNOSIS — Z131 Encounter for screening for diabetes mellitus: Secondary | ICD-10-CM | POA: Diagnosis not present

## 2022-12-30 DIAGNOSIS — R5383 Other fatigue: Secondary | ICD-10-CM

## 2022-12-30 DIAGNOSIS — E559 Vitamin D deficiency, unspecified: Secondary | ICD-10-CM | POA: Diagnosis not present

## 2022-12-30 DIAGNOSIS — R0609 Other forms of dyspnea: Secondary | ICD-10-CM | POA: Insufficient documentation

## 2022-12-30 HISTORY — DX: Other forms of dyspnea: R06.09

## 2022-12-30 NOTE — Patient Instructions (Signed)
Please keep a BP log for 2 weeks and send by MyChart or mail.  Blood Pressure Record Sheet To take your blood pressure, you will need a blood pressure machine. You can buy a blood pressure machine (blood pressure monitor) at your clinic, drug store, or online. When choosing one, consider: An automatic monitor that has an arm cuff. A cuff that wraps snugly around your upper arm. You should be able to fit only one finger between your arm and the cuff. A device that stores blood pressure reading results. Do not choose a monitor that measures your blood pressure from your wrist or finger. Follow your health care provider's instructions for how to take your blood pressure. To use this form: Get one reading in the morning (a.m.) 1-2 hours after you take any medicines. Get one reading in the evening (p.m.) before supper.   Blood pressure log Date: _______________________  a.m. _____________________(1st reading) HR___________            p.m. _____________________(2nd reading) HR__________  Date: _______________________  a.m. _____________________(1st reading) HR___________            p.m. _____________________(2nd reading) HR__________  Date: _______________________  a.m. _____________________(1st reading) HR___________            p.m. _____________________(2nd reading) HR__________  Date: _______________________  a.m. _____________________(1st reading) HR___________            p.m. _____________________(2nd reading) HR__________  Date: _______________________  a.m. _____________________(1st reading) HR___________            p.m. _____________________(2nd reading) HR__________  Date: _______________________  a.m. _____________________(1st reading) HR___________            p.m. _____________________(2nd reading) HR__________  Date: _______________________  a.m. _____________________(1st reading) HR___________            p.m. _____________________(2nd reading)  HR__________   This information is not intended to replace advice given to you by your health care provider. Make sure you discuss any questions you have with your health care provider. Document Revised: 07/18/2019 Document Reviewed: 07/18/2019 Elsevier Patient Education  2021 Elsevier Inc.   Medication Instructions:  Your physician recommends that you continue on your current medications as directed. Please refer to the Current Medication list given to you today.  *If you need a refill on your cardiac medications before your next appointment, please call your pharmacy*   Lab Work: Your physician recommends that you return for lab work in: the next few days for CMP, CBC, TSH, vitamin D, A1C and lipids. You need to have labs done when you are fasting. [facility] lab is located on the 3rd floor, [address]. Hours are Monday - Friday 8 am to 4 pm, closed 11:30 am to 1:00 pm. You do NOT need an appointment.   If you have labs (blood work) drawn today and your tests are completely normal, you will receive your results only by: MyChart Message (if you have MyChart) OR A paper copy in the mail If you have any lab test that is abnormal or we need to change your treatment, we will call you to review the results.   Testing/Procedures:  Stress Echocardiogram Instructions:    1. You may take all of your medications.  2. No food, drink or tobacco products four hours prior to your test.  3. Dress prepared to exercise. Best to wear 2 piece outfit and tennis shoes. Shoes must be closed toe.  4. Please bring all current prescription medications.    Follow-Up: At [facility], you  and your health needs are our priority.  As part of our continuing mission to provide you with exceptional heart care, we have created designated Provider Care Teams.  These Care Teams include your primary Cardiologist (physician) and Advanced Practice Providers (APPs -  Physician Assistants and Nurse Practitioners) who  all work together to provide you with the care you need, when you need it.  We recommend signing up for the patient portal called "MyChart".  Sign up information is provided on this After Visit Summary.  MyChart is used to connect with patients for Virtual Visits (Telemedicine).  Patients are able to view lab/test results, encounter notes, upcoming appointments, etc.  Non-urgent messages can be sent to your provider as well.   To learn more about what you can do with MyChart, go to [email or web address].    Your next appointment:   9 month(s)  The format for your next appointment:   In Person  Provider:   Belva Crome, MD   Other Instructions Exercise Stress Echocardiogram An exercise stress echocardiogram is a test to check how well your heart is working. This test uses sound waves and a computer to make pictures of your heart. These pictures will be taken before and after you exercise. For this test, you will walk on a treadmill or ride a bicycle to make your heart beat faster. While you exercise, your heart will be checked with an electrocardiogram (ECG). Your blood pressure will also be checked. You may have this test if: You have chest pain or a heart problem. You had a heart attack or heart surgery not long ago. You have heart valve problems. You have a condition that causes narrowing of the blood vessels that supply your heart. You have a high risk of heart disease and: You are starting a new exercise program. You need to have a big surgery. Tell a doctor about: Any allergies you have. All medicines you are taking. This includes vitamins, herbs, eye drops, creams, and over-the-counter medicines. Any problems you or family members have had with medicines that make you fall asleep (anesthetic medicines). Any surgeries you have had. Any blood disorders you have. Any medical conditions you have. Whether you are pregnant or may be pregnant. What are the risks? Generally,  this is a safe test. However, problems may occur, including: Chest pain. Feeling dizzy or light-headed. Shortness of breath. Increased or irregular heartbeat. Feeling like you may vomit (nausea) or vomiting. Heart attack. This is very rare. What happens before the test? Medicines Ask your doctor about changing or stopping your normal medicines. This is important if you take diabetes medicines or blood thinners. If you use an inhaler, bring it to the test. General instructions Wear comfortable clothes and walking shoes. Follow instructions from your doctor about what you cannot eat or drink before the test. Do not drink or eat anything that has caffeine in it. Stop having caffeine 24 hours before the test. Do not smoke or use products that contain nicotine or tobacco for 4 hours before the test. If you need help quitting, ask your doctor. What happens during the test?  You will take off your clothes from the waist up and put on a hospital gown. Electrodes or patches will be put on your chest. A blood pressure cuff will be put on your arm. Before you exercise, a computer will make a picture of your heart. To do this: You will lie down and a gel will be put on your chest. A wand  will be moved over the gel. Sound waves from the wand will go to the computer to make the picture. Then, you will start to exercise. You may walk on a treadmill or pedal a bicycle. Your blood pressure and heart rhythm will be checked while you exercise. The exercise will get harder or faster. You will exercise until: Your heart reaches a certain level. You are too tired to go on. You cannot go on because of chest pain, weakness, or dizziness. You will lie down right away so another picture of your heart can be taken. The procedure may vary among doctors and hospitals. What can I expect after the test? After your test, it is common to have: Mild soreness. Mild tiredness. Your heart rate and blood pressure  will be checked until they return to your normal levels. You should not have any new symptoms after this test. Follow these instructions at home: If your doctor says that you can, you may: Eat what you normally eat. Do your normal activities. Take over-the-counter and prescription medicines only as told by your doctor. Keep all follow-up visits. It is up to you to get the results of your test. Ask how to get your results when they are ready. Contact a doctor if: You feel dizzy or light-headed. You have a fast or irregular heartbeat. You feel like you may vomit or you vomit. You have a headache. You feel short of breath. Get help right away if: You develop pain or pressure: In your chest. In your jaw or neck. Between your shoulders. That goes down your left arm. You faint. You have trouble breathing. These symptoms may be an emergency. Get medical help right away. Call your local emergency services (911 in the [place] Do not wait to see if the symptoms will go away. Do not drive yourself to the hospital. Summary This is a test that checks how well your heart is working. Follow instructions about what you cannot eat or drink before the test. Ask your doctor if you should take your normal medicines before the test. Stop having caffeine 24 hours before the test. Do not smoke or use products with nicotine or tobacco in them for 4 hours before the test. During the test, your blood pressure and heart rhythm will be checked while you exercise. This information is not intended to replace advice given to you by your health care provider. Make sure you discuss any questions you have with your health care provider. Document Revised: 12/10/2020 Document Reviewed: 11/20/2019 Elsevier Patient Education  2022 [employer].
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
## 2023-01-01 ENCOUNTER — Other Ambulatory Visit: Payer: Self-pay | Admitting: Cardiology

## 2023-01-03 ENCOUNTER — Encounter: Payer: Self-pay | Admitting: Advanced Practice Midwife

## 2023-01-03 ENCOUNTER — Ambulatory Visit: Payer: BC Managed Care – PPO | Admitting: Advanced Practice Midwife

## 2023-01-03 ENCOUNTER — Telehealth: Payer: Self-pay | Admitting: Cardiology

## 2023-01-03 VITALS — BP 154/96 | HR 93 | Ht 67.5 in | Wt 154.4 lb

## 2023-01-03 DIAGNOSIS — R7303 Prediabetes: Secondary | ICD-10-CM

## 2023-01-03 DIAGNOSIS — R159 Full incontinence of feces: Secondary | ICD-10-CM

## 2023-01-03 DIAGNOSIS — K581 Irritable bowel syndrome with constipation: Secondary | ICD-10-CM | POA: Diagnosis not present

## 2023-01-03 DIAGNOSIS — R92343 Mammographic extreme density, bilateral breasts: Secondary | ICD-10-CM

## 2023-01-03 DIAGNOSIS — N898 Other specified noninflammatory disorders of vagina: Secondary | ICD-10-CM

## 2023-01-03 DIAGNOSIS — L819 Disorder of pigmentation, unspecified: Secondary | ICD-10-CM

## 2023-01-03 DIAGNOSIS — Z01419 Encounter for gynecological examination (general) (routine) without abnormal findings: Secondary | ICD-10-CM | POA: Diagnosis not present

## 2023-01-03 HISTORY — DX: Mammographic extreme density, bilateral breasts: R92.343

## 2023-01-03 HISTORY — DX: Full incontinence of feces: R15.9

## 2023-01-03 MED ORDER — VALSARTAN 160 MG PO TABS: 160.0000 mg | ORAL_TABLET | Freq: Every day | ORAL | 3 refills | Status: DC

## 2023-01-03 NOTE — Telephone Encounter (Signed)
RX sent

## 2023-01-03 NOTE — Progress Notes (Signed)
Subjective:     Erica [name] is a 57 y.o. female here at [facility] for a routine exam.  Current complaints: recent vaginal irritation that resolved but now discoloration of genital area, rectal incontinence x 1 year, IBS, prediabetes with family hx.  Personal health history reviewed: yes.  Do you have a primary care provider? yes Do you feel safe at home? yes  Flowsheet Row Office Visit from 01/03/2023 in [facility] Total Score 0       Health Maintenance Due  Topic Date Due   INFLUENZA VACCINE  11/11/2022   COVID-19 Vaccine (4 - 2023-24 season) 12/12/2022     Risk factors for chronic health problems: Smoking: never Alchohol/how much: occasional Pt BMI: Body mass index is 23.83 kg/m.   Gynecologic History No LMP recorded (lmp unknown). Patient is postmenopausal. Contraception: abstinence and post menopausal status Last Pap: 12/29/2021. Results were: normal Last mammogram: 11/17/22. Results were: normal with dense breasts, category C  Obstetric History OB History  No obstetric history on file.     The following portions of the patient's history were reviewed and updated as appropriate: allergies, current medications, past family history, past medical history, past social history, past surgical history, and problem list.  Review of Systems Pertinent items noted in HPI and remainder of comprehensive ROS otherwise negative.    Objective:  BP (!) 154/96   Pulse 93   Ht 5' 7.5" (1.715 m)   Wt 154 lb 6.4 oz (70 kg)   LMP  (LMP Unknown)   BMI 23.83 kg/m   VS reviewed, nursing note reviewed,  Constitutional: well developed, well nourished, no distress HEENT: normocephalic, thyroid without enlargement or mass HEART: RRR, no murmurs rubs/gallops RESP: clear and equal to auscultation bilaterally in all lobes  Breast Exam:   exam performed: right breast normal without mass, skin or nipple changes or axillary nodes,  left breast normal without mass, skin or nipple changes or axillary nodes Abdomen: soft Neuro: alert and oriented x 3 Skin: warm, dry Psych: affect normal Pelvic exam: On visual inspection, normal external vulvar area, some evidence at introitus of vaginal atrophy. White patches of skin on labia, concentrated in periurethral and clitoral area, and perianal area. Vulvar architecture is intact and no excoriations or lesions noted.  Bimanual exam: Cervix 0/long/high, firm, anterior, neg CMT, uterus nontender, nonenlarged, adnexa without tenderness, enlargement, or mass        Assessment/Plan:   1. Irritable bowel syndrome with constipation  - Referral to Nutrition and Diabetes Services  2. Incontinence of feces, unspecified fecal incontinence type --Pt has seen GI, has some exercises to do.  Referral offered by GI for PT.  I discussed benefits of PT for pelvic floor and other issues, pt to consider following up with GI.  --Diet affects pt symptoms significantly.  Referral to nutritionist for multiple reasons. - Referral to Nutrition and Diabetes Services  3. Prediabetes  - Referral to Nutrition and Diabetes Services  4. Encounter for annual routine gynecological examination --Pap wnl 2023, normal annually with results reviewed since 2018 --Discussed ASCCP guidelines and plan for Pap in 3 years --Annual exam in office --Mammogram 1 month ago, nor  5. Vaginal irritation --Resolved, but pt found skin changes when she looked at the irritation she had recently and was concerned. --Vaginal atrophy with some stricture at the introitus but no evidence of rash or lesion internally or externally  6. Discolored skin --Skin changes  possibly c/w with lichen sclerosis, but no irritation or discomfort today.   --Consult Dr Macon Large after today's exam and plan for pt to f/u with MD for evaluation and possible biopsy  7. Extremely dense tissue of both breasts on mammography --Pt with sister who  died of breast cancer and breast cancer in maternal aunt.  --discussed increased risks of breast cancer with dense breasts --Pt sent info on [place] risk calculator and to notify office if she desires MRI. --Pt aware that costs may not be covered by insurance   Return for Gyn follow up with MD in 1-2 months.   Sharen Counter, CNM 5:22 PM

## 2023-01-03 NOTE — Telephone Encounter (Signed)
*  STAT* If patient is at the pharmacy, call can be transferred to refill team.   1. Which medications need to be refilled? (please list name of each medication and dose if known) valsartan (DIOVAN) 160 MG tablet    2. Would you like to learn more about the convenience, safety, & potential cost savings by using the [facility]? No     3. Are you open to using the [facility] (Type [facility]. No  ).   4. Which pharmacy/location (including street and city if local pharmacy) is medication to be sent to? [id number] - [place], [place] - [address]    5. Do they need a 30 day or 90 day supply? 90

## 2023-01-05 ENCOUNTER — Encounter: Payer: Self-pay | Admitting: Cardiology

## 2023-01-05 ENCOUNTER — Other Ambulatory Visit: Payer: Self-pay | Admitting: Medical

## 2023-01-05 DIAGNOSIS — J309 Allergic rhinitis, unspecified: Secondary | ICD-10-CM

## 2023-01-13 ENCOUNTER — Encounter: Payer: Self-pay | Admitting: Medical

## 2023-01-13 ENCOUNTER — Ambulatory Visit: Payer: BC Managed Care – PPO | Admitting: Medical

## 2023-01-13 VITALS — BP 155/85 | HR 66 | Resp 18 | Ht 67.5 in | Wt 153.6 lb

## 2023-01-13 DIAGNOSIS — R300 Dysuria: Secondary | ICD-10-CM | POA: Diagnosis not present

## 2023-01-13 DIAGNOSIS — R350 Frequency of micturition: Secondary | ICD-10-CM

## 2023-01-13 DIAGNOSIS — R739 Hyperglycemia, unspecified: Secondary | ICD-10-CM | POA: Diagnosis not present

## 2023-01-13 DIAGNOSIS — I10 Essential (primary) hypertension: Secondary | ICD-10-CM | POA: Diagnosis not present

## 2023-01-13 DIAGNOSIS — K580 Irritable bowel syndrome with diarrhea: Secondary | ICD-10-CM

## 2023-01-13 LAB — COMPREHENSIVE METABOLIC PANEL
ALT: 12 U/L (ref 0–35)
AST: 19 U/L (ref 0–37)
Albumin: 4.3 g/dL (ref 3.5–5.2)
Alkaline Phosphatase: 80 U/L (ref 39–117)
BUN: 18 mg/dL (ref 6–23)
CO2: 30 meq/L (ref 19–32)
Calcium: 9.8 mg/dL (ref 8.4–10.5)
Chloride: 103 meq/L (ref 96–112)
Creatinine, Ser: 0.87 mg/dL (ref 0.40–1.20)
GFR: 73.44 mL/min (ref >60.00)
Glucose, Bld: 88 mg/dL (ref 70–99)
Potassium: 4.4 meq/L (ref 3.5–5.1)
Sodium: 138 meq/L (ref 135–145)
Total Bilirubin: 0.7 mg/dL (ref 0.2–1.2)
Total Protein: 7.1 g/dL (ref 6.0–8.3)

## 2023-01-13 LAB — POCT URINALYSIS DIPSTICK
Bilirubin, UA: NEGATIVE
Blood, UA: NEGATIVE
Glucose, UA: NEGATIVE
Ketones, UA: NEGATIVE
Leukocytes, UA: NEGATIVE
Nitrite, UA: NEGATIVE
Protein, UA: NEGATIVE
Spec Grav, UA: 1.015 (ref 1.010–1.025)
Urobilinogen, UA: 0.2 U/dL
pH, UA: 6 (ref 5.0–8.0)

## 2023-01-13 LAB — HEMOGLOBIN A1C: Hgb A1c MFr Bld: 5.6 % (ref 4.6–6.5)

## 2023-01-13 MED ORDER — CEPHALEXIN 500 MG PO CAPS: 500.0000 mg | ORAL_CAPSULE | Freq: Two times a day (BID) | ORAL | 0 refills | Status: DC

## 2023-01-13 NOTE — Progress Notes (Signed)
Subjective:    Patient ID: Erica [name], female    DOB: Feb 23, 1966, 57 y.o.   MRN: [id number]  HPI  Pt has frequent urination for 2 weeks. Urinating more at night. But some in day. No odor to urine. No fever, no chills or sweats. No pain over cva area. Possible pressure over lower abd suprapubic area. But not sure since she has IBS.   Pt does have history of prediabetes. Last A1c was 5.8. States some feeling of hunger all the time but not thirsty.  IBS effecting her work. Managed by her gi. She wants to work at home. Describe severe IBS-d with incontinence.  Review of Systems  Constitutional:  Negative for chills, fatigue and fever.  Respiratory:  Negative for cough, chest tightness and wheezing.   Cardiovascular:  Negative for chest pain and palpitations.  Gastrointestinal:  Negative for abdominal pain, constipation and nausea.  Genitourinary:  Positive for frequency. Negative for difficulty urinating and dysuria.  Musculoskeletal:  Negative for back pain and myalgias.  Skin:  Negative for rash.  Neurological:  Negative for dizziness, seizures and light-headedness.  Hematological:  Negative for adenopathy. Does not bruise/bleed easily.  Psychiatric/Behavioral:  Negative for behavioral problems and decreased concentration. The patient is not nervous/anxious.    Past Medical History:  Diagnosis Date   Actinic skin damage 12/29/2022   Anxiety 09/24/2021   Chest pain of uncertain etiology 10/03/2018   Chronic constipation 09/24/2021   Dyspepsia 07/10/2018   Essential hypertension 08/04/2006   Essential hypertension, benign    Family history of malignant neoplasm of breast 09/24/2021   Fibroids    GERD (gastroesophageal reflux disease)    Hemangioma of skin and subcutaneous tissue 12/29/2022   HH (hiatus hernia)    Hx of adenomatous colonic polyps    IBS 08/04/2006   Iron deficiency anemia 09/24/2021   Lentigo 12/29/2022   Liver hemangioma, giant, incidental finding  09/06/2018   Guidelines recommend that you get a contrast-enhanced MRI liver in 6-12 months to make sure it is not significantly increasing in size.  Symptoms such as abdominal pain or bloating would not be very reliable indicators of the activity at this hemangioma.  If she should develop sudden right upper quadrant or flank pain associated with fever and elevated LFTs, it could indicate bleeding into the hem   Menorrhagia 09/24/2021   Osteopenia 10/2016   SK (seborrheic keratosis) 12/29/2022   Skin sensation disturbance 09/24/2021   Vitamin D deficiency 11/26/2016     Social History   Socioeconomic History   Marital status: Single    Spouse name: Not on file   Number of children: 0   Years of education: Not on file   Highest education level: Not on file  Occupational History   Occupation: MI service specialist    Employer: [employer]  Tobacco Use   Smoking status: Never    Passive exposure: Never   Smokeless tobacco: Never  Vaping Use   Vaping status: Never Used  Substance and Sexual Activity   Alcohol use: No    Alcohol/week: 0.0 standard drinks of alcohol    Comment: occ   Drug use: No   Sexual activity: Not Currently    Birth control/protection: Abstinence, Post-menopausal  Other Topics Concern   Not on file  Social History Narrative   Not on file   Social Determinants of Health   Financial Resource Strain: Not on file  Food Insecurity: Not on file  Transportation Needs: Not on file  Physical Activity: Not on file  Stress: Not on file  Social Connections: Not on file  Intimate Partner Violence: Not on file    Past Surgical History:  Procedure Laterality Date   BREAST BIOPSY Left 12/03/2021   COLONOSCOPY  01/01/2005   Normal    POLYPECTOMY     UTERINE FIBROID EMBOLIZATION      Family History  Problem Relation Age of Onset   Hodgkin's lymphoma Mother    Alzheimer's disease Father    Colon cancer Maternal Grandfather    Stroke Paternal Grandfather     Breast cancer Sister    Breast cancer Maternal Aunt    Esophageal cancer Neg Hx    Stomach cancer Neg Hx    Rectal cancer Neg Hx     No Known Allergies  Current Outpatient Medications on File Prior to Visit  Medication Sig Dispense Refill   famotidine (PEPCID) 20 MG tablet Take 1 tablet (20 mg total) by mouth 2 (two) times daily. 180 tablet 1   fluticasone (FLONASE) 50 MCG/ACT nasal spray SPRAY 2 SPRAYS INTO EACH NOSTRIL EVERY DAY (Patient taking differently: Place 2 sprays into both nostrils as needed for allergies or rhinitis.) 48 mL 2   levocetirizine (XYZAL) 5 MG tablet TAKE 1 TABLET BY MOUTH EVERY DAY IN THE EVENING 90 tablet 2   valsartan (DIOVAN) 160 MG tablet Take 1 tablet (160 mg total) by mouth daily. 90 tablet 3   No current facility-administered medications on file prior to visit.    BP (!) 155/85   Pulse 66   Resp 18   Ht 5' 7.5" (1.715 m)   Wt 153 lb 9.6 oz (69.7 kg)   LMP  (LMP Unknown)   SpO2 100%   BMI 23.70 kg/m            Objective:   Physical Exam  General Mental Status- Alert. General Appearance- Not in acute distress.   Skin General: Color- Normal Color. Moisture- Normal Moisture.  Neck Carotid Arteries- Normal color. Moisture- Normal Moisture. No carotid bruits. No JVD.  Chest and Lung Exam Auscultation: Breath Sounds:-Normal.  Cardiovascular Auscultation:Rythm- Regular. Murmurs & Other Heart Sounds:Auscultation of the heart reveals- No Murmurs.  Abdomen Inspection:-Inspeection Normal. Palpation/Percussion:Note:No mass. Palpation and Percussion of the abdomen reveal- Non Tender, Non Distended + BS, no rebound or guarding.   Neurologic Cranial Nerve exam:- CN III-XII intact(No nystagmus), symmetric smile. Strength:- 5/5 equal and symmetric strength both upper and lower extremities.       Assessment & Plan:   Patient Instructions    Frequent urination -rx keflex antibotic pending urine culture resuls. - POCT Urinalysis  Dipstick - Urine Culture   Elevated blood sugar -make sure frequency not related to elevated sugar/diabetes. - Comp Met (CMET) - Hemoglobin A1c  Hypertension, unspecified type -continue diovan. Bp high today but do think you have white coat component. Check bp daily for next 4 days and update me on readings.   Irritable bowel syndrome with diarrhea -your describe severe symptoms. Continue with GI MD treatment. Letter asking if you can work from home. If you need special forms to fill out may want GI MD to review and see if they are in agreement and would agree to fill out forms.  Follow up date to be determined after lab and bp updaet.     Esperanza Richters, PA-C

## 2023-01-13 NOTE — Patient Instructions (Addendum)
   Frequent urination -rx keflex antibotic pending urine culture resuls. - POCT Urinalysis Dipstick - Urine Culture   Elevated blood sugar -make sure frequency not related to elevated sugar/diabetes. - Comp Met (CMET) - Hemoglobin A1c  Hypertension, unspecified type -continue diovan. Bp high today but do think you have white coat component. Check bp daily for next 4 days and update me on readings.   Irritable bowel syndrome with diarrhea -you describe severe symptoms. Continue with GI MD treatment. Letter asking if you can work from home. If you need special forms to fill out may want GI MD to review and see if they are in agreement and would agree to fill out forms.  Follow up date to be determined after lab and bp update.

## 2023-01-14 LAB — URINE CULTURE
MICRO NUMBER:: 15547907
Result:: NO GROWTH
SPECIMEN QUALITY:: ADEQUATE

## 2023-01-16 NOTE — Progress Notes (Unsigned)
     01/16/2023 Erica [name] [id number] [birth year]   Chief Complaint: Rectal pain   History of Present Illness: Erica [name] is a 57 year old female with a past medical history of depression, hypertension, osteopenia, uterine fibroids, anemia, GERD, IBS and colon polyps.She is followed by Dr. Myrtie Neither.   EGD 08/21/2018: - The esophagus was normal. - The stomach was normal. - The cardia and gastric fundus were normal on retroflexion. - The examined duodenum was normal.   Colonoscopy 05/12/2018: - The examined portion of the ileum was normal. - The entire examined colon is normal on direct and retroflexion views. - No specimens collected. - 10 year colonoscopy recall     Current Medications, Allergies, Past Medical History, Past Surgical History, Family History and Social History were reviewed in [employer] record.   Review of Systems:   Constitutional: Negative for fever, sweats, chills or weight loss.  Respiratory: Negative for shortness of breath.   Cardiovascular: Negative for chest pain, palpitations and leg swelling.  Gastrointestinal: See HPI.  Musculoskeletal: Negative for back pain or muscle aches.  Neurological: Negative for dizziness, headaches or paresthesias.    Physical Exam: LMP  (LMP Unknown)  General: in no acute distress. Head: Normocephalic and atraumatic. Eyes: No scleral icterus. Conjunctiva pink . Ears: Normal auditory acuity. Mouth: Dentition intact. No ulcers or lesions.  Lungs: Clear throughout to auscultation. Heart: Regular rate and rhythm, no murmur. Abdomen: Soft, nontender and nondistended. No masses or hepatomegaly. Normal bowel sounds x 4 quadrants.  Rectal: *** Musculoskeletal: Symmetrical with no gross deformities. Extremities: No edema. Neurological: Alert oriented x 4. No focal deficits.  Psychological: Alert and cooperative. Normal mood and affect  Assessment and Recommendations: ***

## 2023-01-17 ENCOUNTER — Ambulatory Visit: Payer: BC Managed Care – PPO | Admitting: Nurse Practitioner

## 2023-01-17 ENCOUNTER — Encounter: Payer: Self-pay | Admitting: Nurse Practitioner

## 2023-01-17 ENCOUNTER — Telehealth: Payer: Self-pay | Admitting: Medical

## 2023-01-17 VITALS — BP 138/84 | HR 67 | Ht 67.5 in | Wt 158.0 lb

## 2023-01-17 DIAGNOSIS — K219 Gastro-esophageal reflux disease without esophagitis: Secondary | ICD-10-CM | POA: Diagnosis not present

## 2023-01-17 DIAGNOSIS — R159 Full incontinence of feces: Secondary | ICD-10-CM

## 2023-01-17 DIAGNOSIS — K589 Irritable bowel syndrome without diarrhea: Secondary | ICD-10-CM | POA: Diagnosis not present

## 2023-01-17 NOTE — Patient Instructions (Addendum)
You have been given a testing kit to check for small intestine bacterial overgrowth (SIBO) which is completed by a company named [facility]. Make sure to return your test in the mail using the return mailing label given to you along with the kit. The test order, your demographic and insurance information have all already been sent to the company. [facility] will collect an upfront charge of $99.74 for commercial insurance plans and $209.74 if you are paying cash. Make sure to discuss with [facility] PRIOR to having the test to see if they have gotten information from your insurance company as to how much your testing will cost out of pocket, if any. Please contact [facility] at phone number [phone] to get instructions regarding how to perform the test as our office is unable to give specific testing instructions.  We have sent over a referral to rectal physical therapy.  Pepto Bismol- take 1 tablet by mouth twice daily, hold if no bowel movement in 24 hours  Pepcid 20 mg(Over the counter)- 1 tablet by mouth daily  Gaviscon liquid (over the counter)- take 1 tablespoon by mouth 3 times daily as needed   Benefiber- 1 tablespoon daily  Due to recent changes in healthcare laws, you may see the results of your imaging and laboratory studies on MyChart before your provider has had a chance to review them.  We understand that in some cases there may be results that are confusing or concerning to you. Not all laboratory results come back in the same time frame and the provider may be waiting for multiple results in order to interpret others.  Please give [place] 48 hours in order for your provider to thoroughly review all the results before contacting the office for clarification of your results.   Thank you for trusting me with your gastrointestinal care!   Alcide Evener, CRNP

## 2023-01-17 NOTE — Telephone Encounter (Signed)
Erica [name] with [employer] calling to advise he needs additional information to process paperwork. They received a letter but no specifics. He is requesting information providing:   1) medical necessity 2) Is there anything that can be done in the office that would be reasonable 3)in regards to working from home, what is the estimated end date and how many times per week would she need to work from home.   Please call him at [phone], fax #[phone] or he can be reached via email: Erica [name]  He would like this information as soon as possible so he can begin processing patient's accommodation request.

## 2023-01-17 NOTE — Progress Notes (Signed)
____________________________________________________________  Attending physician addendum:  Thank you for sending this case to me. I have reviewed the entire note and agree with the plan.  Yes, further testing is warranted to rule out SIBO and evaluate for pelvic floor dysfunction with anorectal manometry.  If she is agreeable to pursuing those things, then it would be reasonable to compose a letter for her employer generally describing that she has chronic bowel habit symptoms that she reports are difficult to manage in her current work environment.  Her employer should be encouraged to offer her a work from home option if that is something they have available.  That would ultimately be at their discretion.  Amada Jupiter, MD  ____________________________________________________________

## 2023-01-18 ENCOUNTER — Telehealth: Payer: Self-pay | Admitting: Physician Assistant

## 2023-01-18 ENCOUNTER — Telehealth: Payer: Self-pay | Admitting: Nurse Practitioner

## 2023-01-18 NOTE — Telephone Encounter (Signed)
Inbound call from patient, states the SIBO test is out of network for her insurance and is too expensive. She would like to discuss more affordable options. Please advise.

## 2023-01-18 NOTE — Progress Notes (Signed)
Erica [name], I just spoke with this patient and she stated the SIBO breath test was not covered by her insurance plan and out of pocket cost was not affordable. I explained to the patient that I will empirically treat her for SIBO with Xifaxan 550mg  one tab po tid x 14 days # 42, 0 refills and Florastor probiotic one capsule po bid x 4 weeks # 120, no refills. Pls send in Rxs. She accepts this empiric treatment and risks and she will contact me if her symptoms worsen on it. If her insurance carrier does not cover the cost of this medication, pls check with [facility] about getting her samples.  Patient to continue Pepto bismol 2 tabs po once or twice daily as needed, stop if no BM in 24 hours  Enter pelvic floor physical therapy referral for fecal incontinence, may eventually proceed with anal manometry.   Patient will provide you with name of employer and address to where she wants letter sent regarding request to work from home.    Patient appreciated my call

## 2023-01-18 NOTE — Telephone Encounter (Signed)
Pt seen GI on 01/17/23 , per their note they will be writing a letter as well for pt to work from home

## 2023-01-18 NOTE — Telephone Encounter (Signed)
Please see note below and advise  

## 2023-01-18 NOTE — Telephone Encounter (Signed)
I called patient earlier today to follow up on office visit on 01/17/2023. See addendum to that office note.

## 2023-01-18 NOTE — Telephone Encounter (Signed)
Error

## 2023-01-19 ENCOUNTER — Telehealth: Payer: Self-pay

## 2023-01-19 ENCOUNTER — Other Ambulatory Visit: Payer: Self-pay

## 2023-01-19 DIAGNOSIS — R140 Abdominal distension (gaseous): Secondary | ICD-10-CM

## 2023-01-19 DIAGNOSIS — K5900 Constipation, unspecified: Secondary | ICD-10-CM

## 2023-01-19 DIAGNOSIS — K219 Gastro-esophageal reflux disease without esophagitis: Secondary | ICD-10-CM

## 2023-01-19 DIAGNOSIS — K638219 Small intestinal bacterial overgrowth, unspecified: Secondary | ICD-10-CM

## 2023-01-19 MED ORDER — RIFAXIMIN 550 MG PO TABS
550.0000 mg | ORAL_TABLET | Freq: Three times a day (TID) | ORAL | 0 refills | Status: AC
Start: 2023-01-19 — End: 2023-02-02

## 2023-01-19 MED ORDER — SACCHAROMYCES BOULARDII 250 MG PO CAPS
250.0000 mg | ORAL_CAPSULE | Freq: Two times a day (BID) | ORAL | 0 refills | Status: AC
Start: 2023-01-19 — End: 2023-02-16

## 2023-01-19 NOTE — Telephone Encounter (Signed)
Routed Note  Author: Arnaldo Natal, NP Service: Gastroenterology Author Type: Nurse Practitioner  Filed: 01/18/2023  1:13 PM Encounter Date: 01/17/2023 Status: Signed  Editor: Arnaldo Natal, NP (Nurse Practitioner)   Viviann Spare, I just spoke with this patient and she stated the SIBO breath test was not covered by her insurance plan and out of pocket cost was not affordable. I explained to the patient that I will empirically treat her for SIBO with Xifaxan 550mg  one tab po tid x 14 days # 42, 0 refills and Florastor probiotic one capsule po bid x 4 weeks # 120, no refills. Pls send in Rxs. She accepts this empiric treatment and risks and she will contact me if her symptoms worsen on it. If her insurance carrier does not cover the cost of this medication, pls check with [facility] about getting her samples.   Patient to continue Pepto bismol 2 tabs po once or twice daily as needed, stop if no BM in 24 hours   Enter pelvic floor physical therapy referral for fecal incontinence, may eventually proceed with anal manometry.    Patient will provide you with name of employer and address to where she wants letter sent regarding request to work from home.      Patient appreciated my call

## 2023-01-19 NOTE — Telephone Encounter (Signed)
Pt was made aware of Alcide Evener NP recommendations: Prescription was sent to pharmacy. Pt made aware Pt notified to continue Pepto bismol 2 tabs po once or twice daily as needed, stop if no BM in 24 hours Pt stated that physical therapy had called her yesterday and she was at work and unable to speak and they are to call her back. Phone number was provided to their office.    Pt stated that the name of Employer was Debbrah Alar and his Fax number was [phone].

## 2023-01-20 ENCOUNTER — Ambulatory Visit (HOSPITAL_COMMUNITY): Payer: BC Managed Care – PPO

## 2023-01-20 DIAGNOSIS — F329 Major depressive disorder, single episode, unspecified: Secondary | ICD-10-CM | POA: Diagnosis not present

## 2023-01-20 NOTE — Telephone Encounter (Signed)
Inbound call from patient, following up on letter for her job.

## 2023-01-21 NOTE — Telephone Encounter (Signed)
Patient called in to follow up on letter & spoke with Maddie. Pt was advised that Jill Side is currently out of the office today, but has been given information for letter.

## 2023-01-24 ENCOUNTER — Encounter: Payer: Self-pay | Admitting: Nurse Practitioner

## 2023-01-24 NOTE — Telephone Encounter (Signed)
Viviann Spare, I sent a brief letter draft to you. Pls contact the patient and review the letter with her to ensure she is agreeable with the contents.  Thank you

## 2023-01-24 NOTE — Telephone Encounter (Signed)
Letter was reviewed with pt. Pt stated that the letter was great. Letter was sent to Debbrah Alar Fax number was [phone]. Pt made aare Pt verbalized understanding with all questions answered.

## 2023-01-25 NOTE — Telephone Encounter (Signed)
Inbound call from Kathlene November stating he needs additional information for patient. States he needs to know how many days in the week does the patient wish to work from home. Also needs to know how long she will need this. Requesting information to be faxed over. Please advise, thank you.

## 2023-01-25 NOTE — Telephone Encounter (Signed)
Pt was notified of message that was received from [place] regarding the letter from her Job. ( see below). Pt stated that she request to work from home 5 days a week and indefinitely until she feels better and her issues has resolved.   Please review and advise

## 2023-01-25 NOTE — Telephone Encounter (Signed)
Please inform patient, further GI recommendations will be determined after she completes SIBO breath test.  Thank you

## 2023-01-25 NOTE — Telephone Encounter (Signed)
Left message for pt to call back  °

## 2023-01-26 ENCOUNTER — Telehealth: Payer: Self-pay | Admitting: Nurse Practitioner

## 2023-01-26 NOTE — Telephone Encounter (Signed)
Yes, thank you for that update and reminder. We'll see how she responds to the course of Xifaxan which was prescribed to empirically treat for suspected small intestinal overgrowth.

## 2023-01-26 NOTE — Telephone Encounter (Signed)
Pt was made aware of recent recommendations: Pt stated that she recently discussed with Alcide Evener NP that her insurance company was not going  to cover the SIBO test and that she was given medication Please review and advise.

## 2023-01-26 NOTE — Telephone Encounter (Signed)
Inbound call from patient's requesting a call to discuss physical therapy appointment. States she was not able to be scheduled until January. Patient requesting a call to discuss if she can be sooner elsewhere. Please advise, thank you

## 2023-01-27 NOTE — Telephone Encounter (Signed)
Erica [name] pt.

## 2023-01-27 NOTE — Telephone Encounter (Signed)
Noted  

## 2023-01-28 ENCOUNTER — Other Ambulatory Visit: Payer: Self-pay

## 2023-01-28 NOTE — Telephone Encounter (Signed)
Spoke with patient & she asked if we could refer her to a pelvic floor therapist closer to her location in [facility]. Referral faxed to [facility] [phone]. Pt is aware & will call back with any further questions.

## 2023-01-28 NOTE — Telephone Encounter (Signed)
Patient returning call.

## 2023-01-28 NOTE — Telephone Encounter (Signed)
Left message for patient to call back  

## 2023-02-07 ENCOUNTER — Ambulatory Visit: Payer: BC Managed Care – PPO | Admitting: Obstetrics and Gynecology

## 2023-02-07 ENCOUNTER — Encounter: Payer: Self-pay | Admitting: Obstetrics and Gynecology

## 2023-02-07 VITALS — BP 137/87 | HR 73 | Wt 150.0 lb

## 2023-02-07 DIAGNOSIS — N9089 Other specified noninflammatory disorders of vulva and perineum: Secondary | ICD-10-CM | POA: Diagnosis not present

## 2023-02-07 NOTE — Progress Notes (Signed)
Pt is in office for possible skin changes in vaginal area.

## 2023-02-07 NOTE — Progress Notes (Signed)
57 yo postmenopausal returning for follow up. Patient reports improvement in vaginal irritation. She denies any symptoms of vulva pruritus. She feels much better than her last visit and is quite anxious regarding the idea of a vulva biopsy. Patient is without any other complaints  Past Medical History:  Diagnosis Date   Actinic skin damage 12/29/2022   Anxiety 09/24/2021   Chest pain of uncertain etiology 10/03/2018   Chronic constipation 09/24/2021   Dyspepsia 07/10/2018   Essential hypertension 08/04/2006   Essential hypertension, benign    Family history of malignant neoplasm of breast 09/24/2021   Fibroids    GERD (gastroesophageal reflux disease)    Hemangioma of skin and subcutaneous tissue 12/29/2022   HH (hiatus hernia)    Hx of adenomatous colonic polyps    IBS 08/04/2006   Iron deficiency anemia 09/24/2021   Lentigo 12/29/2022   Liver hemangioma, giant, incidental finding 09/06/2018   Guidelines recommend that you get a contrast-enhanced MRI liver in 6-12 months to make sure it is not significantly increasing in size.  Symptoms such as abdominal pain or bloating would not be very reliable indicators of the activity at this hemangioma.  If she should develop sudden right upper quadrant or flank pain associated with fever and elevated LFTs, it could indicate bleeding into the hem   Menorrhagia 09/24/2021   Osteopenia 10/2016   SK (seborrheic keratosis) 12/29/2022   Skin sensation disturbance 09/24/2021   Vitamin D deficiency 11/26/2016   Past Surgical History:  Procedure Laterality Date   BREAST BIOPSY Left 12/03/2021   COLONOSCOPY  01/01/2005   Normal    POLYPECTOMY     UTERINE FIBROID EMBOLIZATION     Family History  Problem Relation Age of Onset   Hodgkin's lymphoma Mother    Alzheimer's disease Father    Breast cancer Sister    Breast cancer Maternal Aunt    Colon cancer Maternal Grandfather    Stroke Paternal Grandfather    Esophageal cancer Neg Hx    Stomach  cancer Neg Hx    Rectal cancer Neg Hx    Pancreatic cancer Neg Hx    Social History   Tobacco Use   Smoking status: Never    Passive exposure: Never   Smokeless tobacco: Never  Vaping Use   Vaping status: Never Used  Substance Use Topics   Alcohol use: No   Drug use: No   ROS See pertinent in HPI. All other systems reviewed and non contributory Blood pressure 137/87, pulse 73, weight 150 lb (68 kg). GENERAL: Well-developed, well-nourished female in no acute distress.  PELVIC: Normal external female genitalia with area of excoriation immediately superior to clitoral hood, almost 1 cm long vertically. Vagina is pink and rugated.  Area of skin discoloration involving skin of perineum, vulva including clitoral hood without other associated skin changes. Chaperone present during the pelvic exam EXTREMITIES: No cyanosis, clubbing, or edema, 2+ distal pulses.  A/P 57 yo postmenopausal with vulva discoloration - Patient hesitant regarding biopsy of the area and thus was not performed - Patient advised to monitor area of discoloration and to return if continues to expand or becomes pruritic - Patient verbalized understanding and all questions were answered

## 2023-02-13 ENCOUNTER — Other Ambulatory Visit: Payer: Self-pay | Admitting: Family Medicine

## 2023-02-13 DIAGNOSIS — K219 Gastro-esophageal reflux disease without esophagitis: Secondary | ICD-10-CM

## 2023-02-17 DIAGNOSIS — F329 Major depressive disorder, single episode, unspecified: Secondary | ICD-10-CM | POA: Diagnosis not present

## 2023-03-01 ENCOUNTER — Telehealth (HOSPITAL_COMMUNITY): Payer: Self-pay | Admitting: *Deleted

## 2023-03-01 NOTE — Telephone Encounter (Signed)
Left message with instructions for upcoming stress echo.  Erica [name]

## 2023-03-04 ENCOUNTER — Telehealth (HOSPITAL_COMMUNITY): Payer: Self-pay | Admitting: *Deleted

## 2023-03-04 NOTE — Telephone Encounter (Signed)
Patient given detailed instructions per Stress Test Requisition Sheet for test on 03/09/2023 at 2:30.Patient Notified to arrive 30 minutes early, and that it is imperative to arrive on time for appointment to keep from having the test rescheduled.  Patient verbalized understanding. Erica [name]

## 2023-03-09 ENCOUNTER — Ambulatory Visit (HOSPITAL_COMMUNITY): Payer: BC Managed Care – PPO | Attending: Cardiology

## 2023-03-09 ENCOUNTER — Ambulatory Visit (HOSPITAL_BASED_OUTPATIENT_CLINIC_OR_DEPARTMENT_OTHER): Payer: BC Managed Care – PPO

## 2023-03-09 DIAGNOSIS — R0609 Other forms of dyspnea: Secondary | ICD-10-CM | POA: Diagnosis not present

## 2023-03-09 LAB — ECHOCARDIOGRAM STRESS TEST
Area-P 1/2: 4.49 cm2
S' Lateral: 3 cm

## 2023-03-09 MED ORDER — PERFLUTREN LIPID MICROSPHERE
1.0000 mL | INTRAVENOUS | Status: AC | PRN
Start: 2023-03-09 — End: 2023-03-09
  Administered 2023-03-09: 6 mL via INTRAVENOUS

## 2023-03-28 ENCOUNTER — Telehealth: Payer: Self-pay

## 2023-03-28 NOTE — Telephone Encounter (Signed)
Initial Comment Caller states late Saturday night she had chest pain. Sunday during day she was okay and then last night in bed right side pain. States it feels like she strained herself. Right eye had vision trouble. Translation No Nurse Assessment Nurse: Dorinda Hill, RN, Asher Muir Date/Time (Eastern Time): 03/28/2023 2:54:06 PM Confirm and document reason for call. If symptomatic, describe symptoms. ---Caller states that Saturday night she had some chest pain, she described it as aching. She states it's still sore but feels better now. He vision changes have also improved. She also had post nasal drainage. Does the patient have any new or worsening symptoms? ---Yes Will a triage be completed? ---Yes Related visit to physician within the last 2 weeks? ---No Does the PT have any chronic conditions? (i.e. diabetes, asthma, this includes High risk factors for pregnancy, etc.) ---No Is this a behavioral health or substance abuse call? ---No Guidelines Guideline Title Affirmed Question Affirmed Notes Nurse Date/Time Lamount Cohen Time) Chest Pain [1] Chest pain lasts > 5 minutes AND [2] occurred in past 3 days (72 hours) (Exception: Feels exactly the same as previously diagnosed heartburn and has Dorinda Hill, Charity fundraiser, Asher Muir 03/28/2023 2:56:12 PM PLEASE NOTE: All timestamps contained within this report are represented as [place] Standard Time. CONFIDENTIALTY NOTICE: This fax transmission is intended only for the addressee. It contains information that is legally privileged, confidential or otherwise protected from use or disclosure. If you are not the intended recipient, you are strictly prohibited from reviewing, disclosing, copying using or disseminating any of this information or taking any action in reliance on or regarding this information. If you have received this fax in error, please notify us immediately by telephone so that we can arrange for its return to [place]. Phone: [phone], Toll-Free:  [phone], Fax: [phone] Page: 2 of 2 Call Id: [id number] Guidelines Guideline Title Affirmed Question Affirmed Notes Nurse Date/Time Lamount Cohen Time) accompanying sour taste in mouth.) Disp. Time Lamount Cohen Time) Disposition Final User 03/28/2023 2:53:19 PM Send to Urgent Queue Lavina Hamman 03/28/2023 3:05:31 PM Go to ED Now (or PCP triage) Yes Dorinda Hill, RN, Asher Muir Final Disposition 03/28/2023 3:05:31 PM Go to ED Now (or PCP triage) Yes Dorinda Hill, RN, Romeo Rabon Disagree/Comply Disagree Caller Understands Yes PreDisposition InappropriateToAsk Care Advice Given Per Guideline * IF NO PCP (PRIMARY CARE PROVIDER) SECOND-LEVEL TRIAGE: You need to be seen within the next hour. Go to the ED/UCC at _____________ Hospital. Leave as soon as you can. * Severe difficulty breathing occurs * Passes out or becomes too weak to stand * You become worse CALL 911 IF: Comments User: Reed Breech, RN Date/Time Lamount Cohen Time): 03/28/2023 2:56:39 PM she is also having pain in her lower back User: Reed Breech, RN Date/Time (Eastern Time): 03/28/2023 3:08:30 PM Call placed to the office and made them aware of the outcome Referrals GO TO FACILITY REFUSED

## 2023-03-28 NOTE — Telephone Encounter (Signed)
Pt called the office to schedule a appointment due to chest pain while laying down. Pt was transferred to the triage line and was advised to head to the ED. I was advised by triage nurse Pt refused and stated she would like to be seen in the office. Called pt back and advised pt it is strongly advised based on her symptoms to the ED, pt stated "its just gas and I don't want to have to pay to be seen at the ED"  advised pt we had no available appointments for this afternoon and scheduled pt an appointment tomorrow morning. Advised pt if she had any new or worsening symptoms to give [place] a call back or head to the ED. Triage note attached.     nitial Comment Caller states late Saturday night she had chest pain. Sunday during day she was okay and then last night in bed right side pain. States it feels like she strained herself. Right eye had vision trouble. Translation No Nurse Assessment Nurse: Erica Hill, RN, Asher Muir Date/Time (Eastern Time): 03/28/2023 2:54:06 PM Confirm and document reason for call. If symptomatic, describe symptoms. ---Caller states that Saturday night she had some chest pain, she described it as aching. She states it's still sore but feels better now. He vision changes have also improved. She also had post nasal drainage. Does the patient have any new or worsening symptoms? ---Yes Will a triage be completed? ---Yes Related visit to physician within the last 2 weeks? ---No Does the PT have any chronic conditions? (i.e. diabetes, asthma, this includes High risk factors for pregnancy, etc.) ---No Is this a behavioral health or substance abuse call? ---No Guidelines Guideline Title Affirmed Question Affirmed Notes Nurse Date/Time Lamount Cohen Time) Chest Pain [1] Chest pain lasts > 5 minutes AND [2] occurred in past 3 days (72 hours) (Exception: Feels exactly the same as previously diagnosed heartburn and has Erica [name], Charity fundraiser, Asher Muir 03/28/2023 2:56:12 PM PLEASE NOTE: All  timestamps contained within this report are represented as [place] Standard Time. CONFIDENTIALTY NOTICE: This fax transmission is intended only for the addressee. It contains information that is legally privileged, confidential or otherwise protected from use or disclosure. If you are not the intended recipient, you are strictly prohibited from reviewing, disclosing, copying using or disseminating any of this information or taking any action in reliance on or regarding this information. If you have received this fax in error, please notify us immediately by telephone so that we can arrange for its return to [place]. Phone: [phone], Toll-Free: [phone], Fax: [phone] Page: 2 of 2 Call Id: [id number] Guidelines Guideline Title Affirmed Question Affirmed Notes Nurse Date/Time Lamount Cohen Time) accompanying sour taste in mouth.) Disp. Time Lamount Cohen Time) Disposition Final User 03/28/2023 2:53:19 PM Send to Urgent Queue Lavina Hamman 03/28/2023 3:05:31 PM Go to ED Now (or PCP triage) Yes Erica Hill, RN, Asher Muir Final Disposition 03/28/2023 3:05:31 PM Go to ED Now (or PCP triage) Yes Erica Hill, RN, Romeo Rabon Disagree/Comply Disagree Caller Understands Yes PreDisposition InappropriateToAsk Care Advice Given Per Guideline * IF NO PCP (PRIMARY CARE PROVIDER) SECOND-LEVEL TRIAGE: You need to be seen within the next hour. Go to the ED/UCC at _____________ Hospital. Leave as soon as you can. * Severe difficulty breathing occurs * Passes out or becomes too weak to stand * You become worse CALL 911 IF: Comments User: Reed Breech, RN Date/Time Lamount Cohen Time): 03/28/2023 2:56:39 PM she is also having pain in her lower back User: Reed Breech, RN Date/Time Lamount Cohen Time): 03/28/2023 3:08:30 PM Call placed  to the office and made them aware of the outcome Referrals GO TO FACILITY REFUSED

## 2023-03-29 ENCOUNTER — Ambulatory Visit: Payer: BC Managed Care – PPO | Admitting: Physician Assistant

## 2023-03-29 ENCOUNTER — Encounter: Payer: Self-pay | Admitting: Physician Assistant

## 2023-03-29 VITALS — BP 116/79 | HR 76 | Temp 98.1°F | Ht 67.5 in | Wt 151.0 lb

## 2023-03-29 DIAGNOSIS — R079 Chest pain, unspecified: Secondary | ICD-10-CM | POA: Diagnosis not present

## 2023-03-29 DIAGNOSIS — K219 Gastro-esophageal reflux disease without esophagitis: Secondary | ICD-10-CM | POA: Diagnosis not present

## 2023-03-29 DIAGNOSIS — R0982 Postnasal drip: Secondary | ICD-10-CM | POA: Diagnosis not present

## 2023-03-29 DIAGNOSIS — H539 Unspecified visual disturbance: Secondary | ICD-10-CM | POA: Diagnosis not present

## 2023-03-29 LAB — POC COVID19 BINAXNOW: SARS Coronavirus 2 Ag: NEGATIVE

## 2023-03-29 MED ORDER — SUCRALFATE 1 G PO TABS
1.0000 g | ORAL_TABLET | Freq: Three times a day (TID) | ORAL | 0 refills | Status: AC
Start: 2023-03-29 — End: ?

## 2023-03-29 MED ORDER — OMEPRAZOLE 20 MG PO CPDR
20.0000 mg | DELAYED_RELEASE_CAPSULE | Freq: Every day | ORAL | 1 refills | Status: DC
Start: 2023-03-29 — End: 2023-06-29

## 2023-03-29 NOTE — Progress Notes (Signed)
Established patient visit   Patient: Erica [name]   DOB: 12/10/1965   57 y.o. Female  MRN: [id number] Visit Date: 03/29/2023  Today's healthcare provider: Alfredia Ferguson, PA-C   Cc. Chest pain, nasal congestion x 3-4 days  Subjective    Discussed the use of AI scribe software for clinical note transcription with the patient, who gave verbal consent to proceed.  History of Present Illness   The patient presents with chest discomfort that started over the weekend. The discomfort is described as a painful ache, similar to a pulled muscle, that radiates to the back and side. Patient appreciates this in the center of her chest and on the right side. The patient noticed a decrease in pain intensity after taking Tylenol. The patient suspects the discomfort might be due to acid reflux triggered by a recent meal of greasy food. The patient has been taking over-the-counter Pepcid to manage the suspected acid reflux and has noticed a decrease in pain intensity.   The patient also experienced a brief episode of vision changes in the right eye yesterday, like something was in here eye, resolved after 3 minutes. The patient denies any associated headache but reports feeling congested, which they attribute to allergies. She would like a COVID test today.       Medications: Outpatient Medications Prior to Visit  Medication Sig   fluticasone (FLONASE) 50 MCG/ACT nasal spray SPRAY 2 SPRAYS INTO EACH NOSTRIL EVERY DAY (Patient taking differently: Place 2 sprays into both nostrils as needed for allergies or rhinitis.)   levocetirizine (XYZAL) 5 MG tablet TAKE 1 TABLET BY MOUTH EVERY DAY IN THE EVENING   valsartan (DIOVAN) 160 MG tablet Take 1 tablet (160 mg total) by mouth daily.   No facility-administered medications prior to visit.    Review of Systems  Constitutional:  Negative for fatigue and fever.  Respiratory:  Negative for cough and shortness of breath.   Cardiovascular:   Positive for chest pain. Negative for leg swelling.  Gastrointestinal:  Negative for abdominal pain.  Neurological:  Negative for dizziness and headaches.       Objective    BP 116/79   Pulse 76   Temp 98.1 F (36.7 C)   Ht 5' 7.5" (1.715 m)   Wt 151 lb (68.5 kg)   LMP  (LMP Unknown)   SpO2 100%   BMI 23.30 kg/m    Physical Exam Constitutional:      General: She is awake.     Appearance: She is well-developed.  HENT:     Head: Normocephalic.  Eyes:     Conjunctiva/sclera: Conjunctivae normal.  Cardiovascular:     Rate and Rhythm: Normal rate and regular rhythm.     Heart sounds: Normal heart sounds.  Pulmonary:     Effort: Pulmonary effort is normal.     Breath sounds: Normal breath sounds.  Skin:    General: Skin is warm.  Neurological:     Mental Status: She is alert and oriented to person, place, and time.  Psychiatric:        Attention and Perception: Attention normal.        Mood and Affect: Mood normal.        Speech: Speech normal.        Behavior: Behavior is cooperative.      Results for orders placed or performed in visit on 03/29/23  POC COVID-19 BinaxNow  Result Value Ref Range   SARS Coronavirus 2 Ag  Negative Negative    Assessment & Plan    Chest pain, unspecified type -     EKG 12-Lead  Vision changes  Gastroesophageal reflux disease, unspecified whether esophagitis present -     Sucralfate; Take 1 tablet (1 g total) by mouth 4 (four) times daily -  with meals and at bedtime.  Dispense: 28 tablet; Refill: 0 -     Omeprazole; Take 1 capsule (20 mg total) by mouth daily. On an empty stomach 20 minutes before eating  Dispense: 90 capsule; Refill: 1  Post-nasal drip -     POC COVID-19 BinaxNow  Covid test negative.  EKG today normal sinus, no ST / T wave changes, stable compared to 12/30/22. Recommending cont pepcid, rx carafate up to QID for acid reflux . Rx omeprazole in AM.   Advised chest pain with diaphoresis, dizziness, nausea,  worsened w/ activity -- to go to ED  Advised pt if vision changes return-- with any systemic symptoms ie weakness, numbness, headache, to go to ED. To also f/u with optho.   Return if symptoms worsen or fail to improve.      Alfredia Ferguson, PA-C  [facility] at [facility] [phone] (phone) [phone] (fax)  [facility]

## 2023-03-29 NOTE — Telephone Encounter (Signed)
Pt asked for an early morning appointment, I didn't see any availability on your schedule. Scheduled pt with Alfredia Ferguson 03/29/2023.

## 2023-04-21 DIAGNOSIS — F329 Major depressive disorder, single episode, unspecified: Secondary | ICD-10-CM | POA: Diagnosis not present

## 2023-04-22 NOTE — Therapy (Deleted)
 OUTPATIENT PHYSICAL THERAPY FEMALE PELVIC EVALUATION   Patient Name: Erica [name] MRN: [id number] DOB:1965/07/16, 58 y.o., female Today's Date: 04/22/2023  END OF SESSION:   Past Medical History:  Diagnosis Date   Actinic skin damage 12/29/2022   Anxiety 09/24/2021   Chest pain of uncertain etiology 10/03/2018   Chronic constipation 09/24/2021   Dyspepsia 07/10/2018   Essential hypertension 08/04/2006   Essential hypertension, benign    Family history of malignant neoplasm of breast 09/24/2021   Fibroids    GERD (gastroesophageal reflux disease)    Hemangioma of skin and subcutaneous tissue 12/29/2022   HH (hiatus hernia)    Hx of adenomatous colonic polyps    IBS 08/04/2006   Iron deficiency anemia 09/24/2021   Lentigo 12/29/2022   Liver hemangioma, giant, incidental finding 09/06/2018   Guidelines recommend that you get a contrast-enhanced MRI liver in 6-12 months to make sure it is not significantly increasing in size.  Symptoms such as abdominal pain or bloating would not be very reliable indicators of the activity at this hemangioma.  If she should develop sudden right upper quadrant or flank pain associated with fever and elevated LFTs, it could indicate bleeding into the hem   Menorrhagia 09/24/2021   Osteopenia 10/2016   SK (seborrheic keratosis) 12/29/2022   Skin sensation disturbance 09/24/2021   Vitamin D  deficiency 11/26/2016   Past Surgical History:  Procedure Laterality Date   BREAST BIOPSY Left 12/03/2021   COLONOSCOPY  01/01/2005   Normal    POLYPECTOMY     UTERINE FIBROID EMBOLIZATION     Patient Active Problem List   Diagnosis Date Noted   Incontinence of feces 01/03/2023   Extremely dense tissue of both breasts on mammography 01/03/2023   DOE (dyspnea on exertion) 12/30/2022   Actinic skin damage 12/29/2022   Hemangioma of skin and subcutaneous tissue 12/29/2022   Lentigo 12/29/2022   SK (seborrheic keratosis) 12/29/2022   Essential  hypertension, benign    Chronic constipation 09/24/2021   Anxiety 09/24/2021   Family history of malignant neoplasm of breast 09/24/2021   Iron deficiency anemia 09/24/2021   Menorrhagia 09/24/2021   Skin sensation disturbance 09/24/2021   HH (hiatus hernia) 09/22/2021   GERD (gastroesophageal reflux disease) 09/22/2021   Fibroids 09/22/2021   Chest pain of uncertain etiology 10/03/2018   Liver hemangioma, giant, incidental finding 09/06/2018   Dyspepsia 07/10/2018   Osteopenia 11/26/2016   Vitamin D  deficiency 11/26/2016   Hx of adenomatous colonic polyps 09/29/2016   Essential hypertension 08/04/2006   IBS 08/04/2006    PCP: [facility] RIGGERS  REFERRING PROVIDER: Cara Elida HERO, NP   REFERRING DIAG:  K58.9 (ICD-10-CM) - Irritable bowel syndrome, unspecified type  R15.9 (ICD-10-CM) - Incontinence of feces, unspecified fecal incontinence type    THERAPY DIAG:  No diagnosis found.  Rationale for Evaluation and Treatment: Rehabilitation  ONSET DATE: ***  SUBJECTIVE:  SUBJECTIVE STATEMENT: *** Fluid intake: {Yes/No:304960894}   PAIN:  Are you having pain? {yes/no:20286} NPRS scale: ***/10 Pain location: {pelvic pain location:27098}  Pain type: {type:313116} Pain description: {PAIN DESCRIPTION:21022940}   Aggravating factors: *** Relieving factors: ***  PRECAUTIONS: {Therapy precautions:24002}  RED FLAGS: {PT Red Flags:29287}   WEIGHT BEARING RESTRICTIONS: {Yes ***/No:24003}  FALLS:  Has patient fallen in last 6 months? {fallsyesno:27318}  LIVING ENVIRONMENT: Lives with: {OPRC lives with:25569::lives with their family} Lives in: {Lives in:25570} Stairs: {opstairs:27293} Has following equipment at home: {Assistive devices:23999}  OCCUPATION: ***  PLOF:  {PLOF:24004}  PATIENT GOALS: ***  PERTINENT HISTORY:  Uterine fibroid embolization; IBS, osteopenia Sexual abuse: {Yes/No:304960894}  BOWEL MOVEMENT: Pain with bowel movement: {yes/no:20286} Type of bowel movement:{PT BM type:27100} Fully empty rectum: {Yes/No:304960894} Leakage: {Yes/No:304960894} Pads: {Yes/No:304960894} Fiber supplement: {Yes/No:304960894}  URINATION: Pain with urination: {yes/no:20286} Fully empty bladder: {Yes/No:304960894} Stream: {PT urination:27102} Urgency: {Yes/No:304960894} Frequency: *** Leakage: {PT leakage:27103} Pads: {Yes/No:304960894}  INTERCOURSE: Pain with intercourse: {pain with intercourse PA:27099} Ability to have vaginal penetration:  {Yes/No:304960894} Climax: *** Marinoff Scale: ***/3  PREGNANCY: Vaginal deliveries *** Tearing {Yes***/No:304960894} C-section deliveries *** Currently pregnant {Yes***/No:304960894}  PROLAPSE: {PT prolapse:27101}   OBJECTIVE:  Note: Objective measures were completed at Evaluation unless otherwise noted.  DIAGNOSTIC FINDINGS:  ***  PATIENT SURVEYS:  {rehab surveys:24030}  PFIQ-7 ***  COGNITION: Overall cognitive status: {cognition:24006}     SENSATION: Light touch: {intact/deficits:24005} Proprioception: {intact/deficits:24005}  MUSCLE LENGTH: Hamstrings: Right *** deg; Left *** deg Thomas test: Right *** deg; Left *** deg  LUMBAR SPECIAL TESTS:  {lumbar special test:25242}  FUNCTIONAL TESTS:  {Functional tests:24029}  GAIT: Distance walked: *** Assistive device utilized: {Assistive devices:23999} Level of assistance: {Levels of assistance:24026} Comments: ***  POSTURE: {posture:25561}  PELVIC ALIGNMENT:  LUMBARAROM/PROM:  A/PROM A/PROM  eval  Flexion   Extension   Right lateral flexion   Left lateral flexion   Right rotation   Left rotation    (Blank rows = not tested)  LOWER EXTREMITY ROM:  {AROM/PROM:27142} ROM Right eval Left eval  Hip flexion     Hip extension    Hip abduction    Hip adduction    Hip internal rotation    Hip external rotation    Knee flexion    Knee extension    Ankle dorsiflexion    Ankle plantarflexion    Ankle inversion    Ankle eversion     (Blank rows = not tested)  LOWER EXTREMITY MMT:  MMT Right eval Left eval  Hip flexion    Hip extension    Hip abduction    Hip adduction    Hip internal rotation    Hip external rotation    Knee flexion    Knee extension    Ankle dorsiflexion    Ankle plantarflexion    Ankle inversion    Ankle eversion     PALPATION:   General  ***                External Perineal Exam ***                             Internal Pelvic Floor ***  Patient confirms identification and approves PT to assess internal pelvic floor and treatment {yes/no:20286}  PELVIC MMT:   MMT eval  Vaginal   Internal Anal Sphincter   External Anal Sphincter   Puborectalis   Diastasis Recti   (Blank rows = not tested)  TONE: ***  PROLAPSE: ***  TODAY'S TREATMENT:                                                                                                                              DATE: ***  EVAL ***   PATIENT EDUCATION:  Education details: *** Person educated: {Person educated:25204} Education method: {Education Method:25205} Education comprehension: {Education Comprehension:25206}  HOME EXERCISE PROGRAM: ***  ASSESSMENT:  CLINICAL IMPRESSION: Patient is a *** y.o. *** who was seen today for physical therapy evaluation and treatment for ***.   OBJECTIVE IMPAIRMENTS: {opptimpairments:25111}.   ACTIVITY LIMITATIONS: {activitylimitations:27494}  PARTICIPATION LIMITATIONS: {participationrestrictions:25113}  PERSONAL FACTORS: {Personal factors:25162} are also affecting patient's functional outcome.   REHAB POTENTIAL: {rehabpotential:25112}  CLINICAL DECISION MAKING: {clinical decision making:25114}  EVALUATION COMPLEXITY: {Evaluation  complexity:25115}   GOALS: Goals reviewed with patient? {yes/no:20286}  SHORT TERM GOALS: Target date: ***  *** Baseline: Goal status: INITIAL  2.  *** Baseline:  Goal status: INITIAL  3.  *** Baseline:  Goal status: INITIAL  4.  *** Baseline:  Goal status: INITIAL  5.  *** Baseline:  Goal status: INITIAL  6.  *** Baseline:  Goal status: INITIAL  LONG TERM GOALS: Target date: ***  *** Baseline:  Goal status: INITIAL  2.  *** Baseline:  Goal status: INITIAL  3.  *** Baseline:  Goal status: INITIAL  4.  *** Baseline:  Goal status: INITIAL  5.  *** Baseline:  Goal status: INITIAL  6.  *** Baseline:  Goal status: INITIAL  PLAN:  PT FREQUENCY: {rehab frequency:25116}  PT DURATION: {rehab duration:25117}  PLANNED INTERVENTIONS: {rehab planned interventions:25118::97110-Therapeutic exercises,97530- Therapeutic [phone]- Neuromuscular re-education,97535- Self Rjmz,02859- Manual therapy}  PLAN FOR NEXT SESSION: ***   Arionne Iams, PT 04/22/2023, 11:02 AM

## 2023-04-22 NOTE — Telephone Encounter (Signed)
 Patient called wantng to know the name or number to the therapist location she was sent top in [place].

## 2023-04-22 NOTE — Telephone Encounter (Signed)
 Spoke with patient & provided her with [facility]'s office number, and advised she call back with any further questions.

## 2023-04-24 DIAGNOSIS — L299 Pruritus, unspecified: Secondary | ICD-10-CM | POA: Diagnosis not present

## 2023-04-25 ENCOUNTER — Ambulatory Visit: Payer: BC Managed Care – PPO | Admitting: Physical Therapy

## 2023-04-28 DIAGNOSIS — B86 Scabies: Secondary | ICD-10-CM | POA: Diagnosis not present

## 2023-04-28 DIAGNOSIS — L308 Other specified dermatitis: Secondary | ICD-10-CM | POA: Diagnosis not present

## 2023-04-30 DIAGNOSIS — F329 Major depressive disorder, single episode, unspecified: Secondary | ICD-10-CM | POA: Diagnosis not present

## 2023-05-02 ENCOUNTER — Ambulatory Visit: Payer: BC Managed Care – PPO | Admitting: Physical Therapy

## 2023-05-09 ENCOUNTER — Encounter: Payer: BC Managed Care – PPO | Admitting: Physical Therapy

## 2023-05-16 ENCOUNTER — Encounter: Payer: BC Managed Care – PPO | Admitting: Physical Therapy

## 2023-05-21 DIAGNOSIS — F329 Major depressive disorder, single episode, unspecified: Secondary | ICD-10-CM | POA: Diagnosis not present

## 2023-05-25 NOTE — Telephone Encounter (Signed)
Inbound call from patient, states the rehab center is not covered by her insurance, and she would have to pay over $300 every visit. She would like to know if [facility] Side has any other recommendations.

## 2023-05-25 NOTE — Telephone Encounter (Signed)
Please see note below and advise

## 2023-05-26 NOTE — Telephone Encounter (Signed)
Spoke with patient & advised that kegel instructions have been sent to her mychart. If any issues she will let us know & we will print copy and mail it to her.

## 2023-05-26 NOTE — Telephone Encounter (Signed)
Glendora Score, can you print out Kegel's exercises for patient and send to her?

## 2023-06-28 ENCOUNTER — Other Ambulatory Visit: Payer: Self-pay | Admitting: Physician Assistant

## 2023-06-28 DIAGNOSIS — F329 Major depressive disorder, single episode, unspecified: Secondary | ICD-10-CM | POA: Diagnosis not present

## 2023-06-28 DIAGNOSIS — K219 Gastro-esophageal reflux disease without esophagitis: Secondary | ICD-10-CM

## 2023-06-30 ENCOUNTER — Telehealth: Payer: Self-pay | Admitting: Medical

## 2023-06-30 NOTE — Telephone Encounter (Signed)
 Copied from CRM [id number]. Topic: Referral - Request for Referral >> Jun 30, 2023  4:40 PM Sim Boast F wrote: Did the patient discuss referral with their provider in the last year? Yes (If No - schedule appointment) (If Yes - send message)  Appointment offered? No  Type of order/referral and detailed reason for visit: Skin irritation   Preference of office, provider, location: Dr. [facility] on [address]   If referral order, have you been seen by this specialty before? Yes but she said they she was discriminated against. [facility] in [facility]  (If Yes, this issue or another issue? When? Where?  Can we respond through MyChart? No

## 2023-07-01 NOTE — Telephone Encounter (Signed)
Schedule pt an appt.

## 2023-07-04 NOTE — Telephone Encounter (Signed)
 Pt called back. I sched her an appt for tomorrow.

## 2023-07-05 ENCOUNTER — Ambulatory Visit: Admitting: Medical

## 2023-07-05 ENCOUNTER — Encounter: Payer: Self-pay | Admitting: Medical

## 2023-07-05 VITALS — BP 137/78 | HR 68 | Resp 18 | Ht 67.5 in | Wt 152.0 lb

## 2023-07-05 DIAGNOSIS — T7840XA Allergy, unspecified, initial encounter: Secondary | ICD-10-CM

## 2023-07-05 DIAGNOSIS — R21 Rash and other nonspecific skin eruption: Secondary | ICD-10-CM | POA: Diagnosis not present

## 2023-07-05 DIAGNOSIS — L853 Xerosis cutis: Secondary | ICD-10-CM

## 2023-07-05 NOTE — Progress Notes (Signed)
 Subjective:    Patient ID: Erica [name], female    DOB: [birth year], 58 y.o.   MRN: [id number]  HPI  Discussed the use of AI scribe software for clinical note transcription with the patient, who gave verbal consent to proceed.  History of Present Illness   Erica [name] is a 58 year old female who presents with a persistent rash and itching. She was referred by a dermatologist for further evaluation of her skin condition.  She has been experiencing a persistent rash and itching that initially began on her neck, described as feeling like 'something was biting me all the time.' Initially, she sought care at an urgent care facility where scabies was suspected, leading to a referral to a dermatologist.  At [facility], she was diagnosed with eczema and prescribed ivermectin and a steroids. Despite this treatment, the itching persisted and spread to her eyebrows, face, and lower back, although the rash did not reappear.  She experienced side effects from the steroids, including skin darkening on her face and hands, prompting her to discontinue its use. Additionally, she had leg cramps, which resolved after stopping the medication. She was prescribed ivermectin again but did not take prednisone due to concerns about the diagnosis.  Her skin is drier than before, and her hands have changed color. She frequently switches detergents and soaps, which may contribute to her symptoms. She has been referred to a specialist in [place] or [place] but is frustrated with the lack of a definitive diagnosis.          Review of Systems  Constitutional:  Negative for chills, fatigue and fever.  HENT:  Negative for dental problem.   Respiratory:  Negative for chest tightness, shortness of breath and wheezing.   Cardiovascular:  Negative for chest pain and palpitations.  Gastrointestinal:  Negative for abdominal pain and anal bleeding.  Genitourinary:  Negative for dysuria.   Musculoskeletal:  Negative for back pain and joint swelling.  Skin:  Positive for rash.       Itching.  Neurological:  Negative for dizziness, tremors, seizures, light-headedness, numbness and headaches.  Hematological:  Negative for adenopathy. Does not bruise/bleed easily.  Psychiatric/Behavioral:  Negative for behavioral problems and decreased concentration.     Past Medical History:  Diagnosis Date   Actinic skin damage 12/29/2022   Anxiety 09/24/2021   Chest pain of uncertain etiology 10/03/2018   Chronic constipation 09/24/2021   Dyspepsia 07/10/2018   Essential hypertension 08/04/2006   Essential hypertension, benign    Family history of malignant neoplasm of breast 09/24/2021   Fibroids    GERD (gastroesophageal reflux disease)    Hemangioma of skin and subcutaneous tissue 12/29/2022   HH (hiatus hernia)    Hx of adenomatous colonic polyps    IBS 08/04/2006   Iron deficiency anemia 09/24/2021   Lentigo 12/29/2022   Liver hemangioma, giant, incidental finding 09/06/2018   Guidelines recommend that you get a contrast-enhanced MRI liver in 6-12 months to make sure it is not significantly increasing in size.  Symptoms such as abdominal pain or bloating would not be very reliable indicators of the activity at this hemangioma.  If she should develop sudden right upper quadrant or flank pain associated with fever and elevated LFTs, it could indicate bleeding into the hem   Menorrhagia 09/24/2021   Osteopenia 10/2016   SK (seborrheic keratosis) 12/29/2022   Skin sensation disturbance 09/24/2021   Vitamin D deficiency 11/26/2016     Social History  Socioeconomic History   Marital status: Single    Spouse name: Not on file   Number of children: 0   Years of education: Not on file   Highest education level: Not on file  Occupational History   Occupation: MI service specialist    Employer: [employer]  Tobacco Use   Smoking status: Never    Passive exposure: Never    Smokeless tobacco: Never  Vaping Use   Vaping status: Never Used  Substance and Sexual Activity   Alcohol use: No   Drug use: No   Sexual activity: Not Currently    Birth control/protection: Abstinence, Post-menopausal  Other Topics Concern   Not on file  Social History Narrative   Not on file   Social Drivers of Health   Financial Resource Strain: Not on file  Food Insecurity: Not on file  Transportation Needs: Not on file  Physical Activity: Not on file  Stress: Not on file  Social Connections: Not on file  Intimate Partner Violence: Not on file    Past Surgical History:  Procedure Laterality Date   BREAST BIOPSY Left 12/03/2021   COLONOSCOPY  01/01/2005   Normal    POLYPECTOMY     UTERINE FIBROID EMBOLIZATION      Family History  Problem Relation Age of Onset   Hodgkin's lymphoma Mother    Alzheimer's disease Father    Breast cancer Sister    Breast cancer Maternal Aunt    Colon cancer Maternal Grandfather    Stroke Paternal Grandfather    Esophageal cancer Neg Hx    Stomach cancer Neg Hx    Rectal cancer Neg Hx    Pancreatic cancer Neg Hx     No Known Allergies  Current Outpatient Medications on File Prior to Visit  Medication Sig Dispense Refill   fluticasone (FLONASE) 50 MCG/ACT nasal spray SPRAY 2 SPRAYS INTO EACH NOSTRIL EVERY DAY (Patient taking differently: Place 2 sprays into both nostrils as needed for allergies or rhinitis.) 48 mL 2   levocetirizine (XYZAL) 5 MG tablet TAKE 1 TABLET BY MOUTH EVERY DAY IN THE EVENING 90 tablet 2   omeprazole (PRILOSEC) 20 MG capsule TAKE 1 CAPSULE (20 MG TOTAL) BY MOUTH DAILY. ON AN EMPTY STOMACH 20 MINUTES BEFORE EATING 90 capsule 1   sucralfate (CARAFATE) 1 g tablet Take 1 tablet (1 g total) by mouth 4 (four) times daily -  with meals and at bedtime. 28 tablet 0   valsartan (DIOVAN) 160 MG tablet Take 1 tablet (160 mg total) by mouth daily. 90 tablet 3   No current facility-administered medications on file  prior to visit.    BP (!) 140/86   Pulse 68   Resp 18   Ht 5' 7.5" (1.715 m)   Wt 152 lb (68.9 kg)   LMP  (LMP Unknown)   SpO2 100%   BMI 23.46 kg/m        Objective:   Physical Exam  General- No acute distress. Pleasant patient. Neck- Full range of motion, no jvd Lungs- Clear, even and unlabored. Heart- regular rate and rhythm. Neurologic- CNII- XII grossly intact.   Skin- back of neck skin feels dry. But no rash. On hands knuckles appear mild hyperpigmented. Face- no rash but make up on. Back- no rash presently.      Assessment & Plan:   Assessment and Plan    Allergic reaction vs ezcema. Treated for scabies but symptoms persist intermittently. Intermittent pruritus possibly due to unidentified allergic reaction,  exacerbated by xerosis or detergent changes. Scabies ruled out. Prioritized allergist referral over dermatology for potential allergic etiology. - Refer to allergist for evaluation of potential allergic reactions. - Refer to dermatologist for a second opinion. - Advise her to revert to previous detergent used before the rash occurred. - Recommend moisturizing skin with an over-the-counter cream containing vitamin E. - Complete the current course of ivermectin if already started.  Hypertension Blood pressure improved on valsartan. - Continue valsartan 160 mg daily.   Follow up date to be determined after specialist note review.       Esperanza Richters, PA-C

## 2023-07-05 NOTE — Patient Instructions (Signed)
 Allergic reaction vs ezcema. Treated for scabies but symptoms persist intermittently. Intermittent pruritus possibly due to unidentified allergic reaction, exacerbated by xerosis or detergent changes. Scabies ruled out. Prioritized allergist referral over dermatology for potential allergic etiology. - Refer to allergist for evaluation of potential allergic reactions. - Refer to dermatologist for a second opinion. - Advise her to revert to previous detergent used before the rash occurred. - Recommend moisturizing skin with an over-the-counter cream containing vitamin E. - Complete the current course of ivermectin if already started.  Hypertension Blood pressure improved on valsartan. - Continue valsartan 160 mg daily.   Follow up date to be determined after specialist note review.

## 2023-07-11 NOTE — Telephone Encounter (Signed)
 Copied from CRM [id number]. Topic: Referral - Question >> Jul 11, 2023 12:15 PM Aisha D wrote: Reason for CRM: Patient stated that she needs another referral for a dermatologist. Patient stated that her referral was for the [facility] location in [facility]. Patient stated that she does not want to go to that location.

## 2023-07-12 ENCOUNTER — Telehealth: Payer: Self-pay | Admitting: Medical

## 2023-07-12 NOTE — Telephone Encounter (Signed)
 Pt has appt with [facility] and she doesn't want to go there so can we send her somewhere else? She really needs to see someone

## 2023-08-02 DIAGNOSIS — F329 Major depressive disorder, single episode, unspecified: Secondary | ICD-10-CM | POA: Diagnosis not present

## 2023-08-10 DIAGNOSIS — F329 Major depressive disorder, single episode, unspecified: Secondary | ICD-10-CM | POA: Diagnosis not present

## 2023-08-10 DIAGNOSIS — F419 Anxiety disorder, unspecified: Secondary | ICD-10-CM | POA: Diagnosis not present

## 2023-08-17 ENCOUNTER — Ambulatory Visit: Admitting: Medical

## 2023-08-17 VITALS — BP 145/78 | HR 67 | Temp 98.5°F | Resp 16 | Ht 67.5 in | Wt 151.0 lb

## 2023-08-17 DIAGNOSIS — N898 Other specified noninflammatory disorders of vagina: Secondary | ICD-10-CM | POA: Diagnosis not present

## 2023-08-17 DIAGNOSIS — R21 Rash and other nonspecific skin eruption: Secondary | ICD-10-CM | POA: Diagnosis not present

## 2023-08-17 DIAGNOSIS — L853 Xerosis cutis: Secondary | ICD-10-CM | POA: Diagnosis not present

## 2023-08-17 DIAGNOSIS — I10 Essential (primary) hypertension: Secondary | ICD-10-CM

## 2023-08-17 MED ORDER — FLUCONAZOLE 150 MG PO TABS: 150.0000 mg | ORAL_TABLET | Freq: Every day | ORAL | 0 refills | Status: AC

## 2023-08-17 MED ORDER — HYDROXYZINE HCL 10 MG PO TABS: 10.0000 mg | ORAL_TABLET | Freq: Three times a day (TID) | ORAL | 0 refills | Status: AC | PRN

## 2023-08-17 NOTE — Patient Instructions (Addendum)
 Pruritus(not convinced had scabies. Possible dry skin vs neurodermatitis) Persistent pruritus unresponsive to ivermectin and steroids. Differential includes neurodermatitis, possibly impacted by  stress. - Prioritize allergist appointment for allergy  evaluation. - Apply vitamin E moisturizers to affected areas.(palmers brand) - Prescribe hydroxyzine 10 mg for itching, anxiety, and insomnia; use at night and up to three times a day on weekends if needed. - Expedite dermatology appointment by contacting [facility]. I gave you number as current derm appt in 5-6 months.  Vaginal yeast infection possible Symptoms suggestive of yeast infection. Diflucan  effective for mild to moderate cases. - Prescribe Diflucan  150 mg, one tablet for one day.  Hypertension Blood pressure slightly elevated at 145/78 mmHg. Currently on valsartan  160 mg daily. Home readings better than in-office. Target BP 130/80 mmHg. - Check blood pressure at home every other day for 10 days and update via MyChart. - Consider increasing valsartan  to 320 mg if home readings remain elevated.  Follow up date to be determined based on my chart bp update in 10 days, allergy  and derm appt date and response to moisturizer and hydroxyzine tx.

## 2023-08-17 NOTE — Progress Notes (Unsigned)
 Subjective:    Patient ID: Erica [name], female    DOB: Oct 04, 1965, 58 y.o.   MRN: [id number]  HPI  Erica [name] is a 58 year old female who presents with recurrent itching and rash after treatment for suspected scabies.  She experiences recurrent itching and rash, initially treated for suspected scabies with oral ivermectin and steroids. Ivermectin was taken twice, one month apart, with initial improvement, but symptoms have returned. The rash and itching are located on her eyebrows, face, lower back, and private parts. She also reports a sensation of something crawling around her eyebrows and lower back.  She mentions a history of switching detergents around the time the rash started but has since returned to using Tide, which she had used previously. There has been no improvement in symptoms with this change.  She reports vaginal symptoms including itchiness and sores, describing a sensation of 'biting'. She has not taken any recent treatment for these symptoms.  Her blood pressure was noted to be elevated at 145/78 during the visit. She is currently taking valsartan  160 mg daily and has a blood pressure cuff at home, though she has not been regularly monitoring her blood pressure, assuming it to be stable.  She has been referred to both an allergist and a dermatologist for further evaluation, but has not yet scheduled the allergist appointment. She is awaiting a dermatology appointment scheduled for August or September.     Review of Systems  Constitutional:  Negative for chills, fatigue and fever.  Respiratory:  Negative for cough, chest tightness and wheezing.   Cardiovascular:  Negative for chest pain and palpitations.  Gastrointestinal:  Negative for abdominal pain.  Genitourinary:  Negative for dysuria and flank pain.  Musculoskeletal:  Negative for back pain.  Skin:  Positive for rash.  Neurological:  Negative for dizziness, syncope and numbness.   Hematological:  Negative for adenopathy. Does not bruise/bleed easily.  Psychiatric/Behavioral:  Negative for behavioral problems, decreased concentration and suicidal ideas. The patient is not nervous/anxious.     Past Medical History:  Diagnosis Date   Actinic skin damage 12/29/2022   Anxiety 09/24/2021   Chest pain of uncertain etiology 10/03/2018   Chronic constipation 09/24/2021   Dyspepsia 07/10/2018   Essential hypertension 08/04/2006   Essential hypertension, benign    Family history of malignant neoplasm of breast 09/24/2021   Fibroids    GERD (gastroesophageal reflux disease)    Hemangioma of skin and subcutaneous tissue 12/29/2022   HH (hiatus hernia)    Hx of adenomatous colonic polyps    IBS 08/04/2006   Iron deficiency anemia 09/24/2021   Lentigo 12/29/2022   Liver hemangioma, giant, incidental finding 09/06/2018   Guidelines recommend that you get a contrast-enhanced MRI liver in 6-12 months to make sure it is not significantly increasing in size.  Symptoms such as abdominal pain or bloating would not be very reliable indicators of the activity at this hemangioma.  If she should develop sudden right upper quadrant or flank pain associated with fever and elevated LFTs, it could indicate bleeding into the hem   Menorrhagia 09/24/2021   Osteopenia 10/2016   SK (seborrheic keratosis) 12/29/2022   Skin sensation disturbance 09/24/2021   Vitamin D  deficiency 11/26/2016     Social History   Socioeconomic History   Marital status: Single    Spouse name: Not on file   Number of children: 0   Years of education: Not on file   Highest education level: Not on  file  Occupational History   Occupation: MI service specialist    Employer: [employer]  Tobacco Use   Smoking status: Never    Passive exposure: Never   Smokeless tobacco: Never  Vaping Use   Vaping status: Never Used  Substance and Sexual Activity   Alcohol use: No   Drug use: No   Sexual activity:  Not Currently    Birth control/protection: Abstinence, Post-menopausal  Other Topics Concern   Not on file  Social History Narrative   Not on file   Social Drivers of Health   Financial Resource Strain: Not on file  Food Insecurity: Not on file  Transportation Needs: Not on file  Physical Activity: Not on file  Stress: Not on file  Social Connections: Not on file  Intimate Partner Violence: Not on file    Past Surgical History:  Procedure Laterality Date   BREAST BIOPSY Left 12/03/2021   COLONOSCOPY  01/01/2005   Normal    POLYPECTOMY     UTERINE FIBROID EMBOLIZATION      Family History  Problem Relation Age of Onset   Hodgkin's lymphoma Mother    Alzheimer's disease Father    Breast cancer Sister    Breast cancer Maternal Aunt    Colon cancer Maternal Grandfather    Stroke Paternal Grandfather    Esophageal cancer Neg Hx    Stomach cancer Neg Hx    Rectal cancer Neg Hx    Pancreatic cancer Neg Hx     No Known Allergies  Current Outpatient Medications on File Prior to Visit  Medication Sig Dispense Refill   fluticasone  (FLONASE ) 50 MCG/ACT nasal spray SPRAY 2 SPRAYS INTO EACH NOSTRIL EVERY DAY (Patient taking differently: Place 2 sprays into both nostrils as needed for allergies or rhinitis.) 48 mL 2   levocetirizine (XYZAL ) 5 MG tablet TAKE 1 TABLET BY MOUTH EVERY DAY IN THE EVENING 90 tablet 2   sucralfate  (CARAFATE ) 1 g tablet Take 1 tablet (1 g total) by mouth 4 (four) times daily -  with meals and at bedtime. 28 tablet 0   valsartan  (DIOVAN ) 160 MG tablet Take 1 tablet (160 mg total) by mouth daily. 90 tablet 3   No current facility-administered medications on file prior to visit.    BP (!) 145/78   Pulse 67   Temp 98.5 F (36.9 C) (Oral)   Resp 16   Ht 5' 7.5" (1.715 m)   Wt 151 lb (68.5 kg)   LMP  (LMP Unknown)   SpO2 100%   BMI 23.30 kg/m        Objective:   Physical Exam   General Mental Status- Alert. General Appearance- Not in acute  distress.   Skin General: Color- Normal Color. Moisture- Normal Moisture.  Neck Carotid Arteries- Normal color. Moisture- Normal Moisture. No carotid bruits. No JVD.  Chest and Lung Exam Auscultation: Breath Sounds:-Normal.  Cardiovascular Auscultation:Rythm- Regular. Murmurs & Other Heart Sounds:Auscultation of the heart reveals- No Murmurs.  Abdomen Inspection:-Inspeection Normal. Palpation/Percussion:Note:No mass. Palpation and Percussion of the abdomen reveal- Non Tender, Non Distended + BS, no rebound or guarding.    Neurologic Cranial Nerve exam:- CN III-XII intact(No nystagmus), symmetric smile. Drift Test:- No drift. Romberg Exam:- Negative.  Heal to Toe Gait exam:-Normal. Finger to Nose:- Normal/Intact Strength:- 5/5 equal and symmetric strength both upper and lower extremities.      Assessment & Plan:   Patient Instructions  Pruritus(not convinced had scabies. Possible dry skin vs neurodermatitis) Persistent pruritus unresponsive  to ivermectin and steroids. Differential includes neurodermatitis, possibly impacted by  stress. - Prioritize allergist appointment for allergy  evaluation. - Apply vitamin E moisturizers to affected areas.(palmers brand) - Prescribe hydroxyzine 10 mg for itching, anxiety, and insomnia; use at night and up to three times a day on weekends if needed. - Expedite dermatology appointment by contacting [facility]. I gave you number as current derm appt in 5-6 months.  Vaginal yeast infection possible Symptoms suggestive of yeast infection. Diflucan  effective for mild to moderate cases. - Prescribe Diflucan  150 mg, one tablet for one day.  Hypertension Blood pressure slightly elevated at 145/78 mmHg. Currently on valsartan  160 mg daily. Home readings better than in-office. Target BP 130/80 mmHg. - Check blood pressure at home every other day for 10 days and update via MyChart. - Consider increasing valsartan  to 320 mg if home  readings remain elevated.  Follow up date to be determined based on my chart bp update, allergy  and derm appt date and response to moisturizer and hydroxyzine tx.

## 2023-09-04 ENCOUNTER — Encounter (HOSPITAL_COMMUNITY): Payer: Self-pay

## 2023-09-04 ENCOUNTER — Emergency Department (HOSPITAL_COMMUNITY)
Admission: EM | Admit: 2023-09-04 | Discharge: 2023-09-04 | Disposition: A | Discharge status: Home / Self Care | Attending: Emergency Medicine | Admitting: Emergency Medicine

## 2023-09-04 ENCOUNTER — Emergency Department (HOSPITAL_COMMUNITY)

## 2023-09-04 ENCOUNTER — Other Ambulatory Visit: Payer: Self-pay

## 2023-09-04 DIAGNOSIS — Y9351 Activity, roller skating (inline) and skateboarding: Secondary | ICD-10-CM | POA: Insufficient documentation

## 2023-09-04 DIAGNOSIS — S6992XA Unspecified injury of left wrist, hand and finger(s), initial encounter: Secondary | ICD-10-CM | POA: Diagnosis not present

## 2023-09-04 DIAGNOSIS — S52322A Displaced transverse fracture of shaft of left radius, initial encounter for closed fracture: Secondary | ICD-10-CM | POA: Diagnosis not present

## 2023-09-04 DIAGNOSIS — W19XXXA Unspecified fall, initial encounter: Secondary | ICD-10-CM | POA: Insufficient documentation

## 2023-09-04 DIAGNOSIS — S52612A Displaced fracture of left ulna styloid process, initial encounter for closed fracture: Secondary | ICD-10-CM | POA: Diagnosis not present

## 2023-09-04 DIAGNOSIS — I10 Essential (primary) hypertension: Secondary | ICD-10-CM | POA: Insufficient documentation

## 2023-09-04 DIAGNOSIS — S52502A Unspecified fracture of the lower end of left radius, initial encounter for closed fracture: Secondary | ICD-10-CM | POA: Diagnosis not present

## 2023-09-04 DIAGNOSIS — Z79899 Other long term (current) drug therapy: Secondary | ICD-10-CM | POA: Diagnosis not present

## 2023-09-04 DIAGNOSIS — M7989 Other specified soft tissue disorders: Secondary | ICD-10-CM | POA: Diagnosis not present

## 2023-09-04 DIAGNOSIS — S52352A Displaced comminuted fracture of shaft of radius, left arm, initial encounter for closed fracture: Secondary | ICD-10-CM | POA: Diagnosis not present

## 2023-09-04 DIAGNOSIS — S52592A Other fractures of lower end of left radius, initial encounter for closed fracture: Secondary | ICD-10-CM | POA: Diagnosis not present

## 2023-09-04 MED ORDER — BUPIVACAINE HCL (PF) 0.5 % IJ SOLN
20.0000 mL | Freq: Once | INTRAMUSCULAR | Status: AC
  Administered 2023-09-04: 20 mL
  Filled 2023-09-04: qty 30

## 2023-09-04 MED ORDER — PANTOPRAZOLE SODIUM 40 MG PO TBEC
40.0000 mg | DELAYED_RELEASE_TABLET | Freq: Every day | ORAL | Status: DC
  Administered 2023-09-04: 40 mg via ORAL
  Filled 2023-09-04: qty 1

## 2023-09-04 MED ORDER — ONDANSETRON 4 MG PO TBDP
4.0000 mg | ORAL_TABLET | Freq: Once | ORAL | Status: AC
  Administered 2023-09-04: 4 mg via ORAL
  Filled 2023-09-04: qty 1

## 2023-09-04 MED ORDER — OXYCODONE-ACETAMINOPHEN 5-325 MG PO TABS
1.0000 | ORAL_TABLET | Freq: Once | ORAL | Status: AC
  Administered 2023-09-04: 1 via ORAL
  Filled 2023-09-04: qty 1

## 2023-09-04 MED ORDER — ONDANSETRON 4 MG PO TBDP: 4.0000 mg | ORAL_TABLET | Freq: Three times a day (TID) | ORAL | 0 refills | Status: AC | PRN

## 2023-09-04 MED ORDER — OXYCODONE-ACETAMINOPHEN 5-325 MG PO TABS: 1.0000 | ORAL_TABLET | Freq: Four times a day (QID) | ORAL | 0 refills | Status: AC | PRN

## 2023-09-04 NOTE — Discharge Instructions (Addendum)
 You were seen in the ER today for evaluation of your wrist pain. Your wrist is broken and is likely will need surgery.  Include admission for the hand surgeon to this discharge paperwork.  You need to call him to schedule an appointment.  His appointment your cast stays dry and clean.  I have included more information on this into the discharge paperwork.  Contact a health care provider if you have: Pain that does not get better with medicine. Swelling that gets worse. A bad smell coming from your cast. Get help right away if: You cannot move your fingers. You have severe pain, especially if the pain changes significantly or suddenly. Your fingers or your hand: Become numb, cold, or pale. Turn a bluish color.

## 2023-09-04 NOTE — Progress Notes (Signed)
 Orthopedic Tech Progress Note Patient Details:  Erica [name] 04/08/1966 [id number]  Ortho Devices Type of Ortho Device: Sugartong splint, Shoulder immobilizer Ortho Device/Splint Location: LUE Ortho Device/Splint Interventions: Ordered, Application, Adjustment   Post Interventions Patient Tolerated: Well Instructions Provided: Adjustment of device, Care of device  Herbie Loll 09/04/2023, 6:44 PM

## 2023-09-04 NOTE — ED Provider Notes (Signed)
 [place] [facility]ERGENCY DEPARTMENT AT [facility] Provider Note   CSN: [id number] Arrival date & time: 09/04/23  1551     History Chief Complaint  Patient presents with   Wrist Injury    Erica [name] is a 58 y.o. female with history of hypertension, acid reflux presents to the emergency department today for evaluation of left wrist pain.  Patient is left-handed.  Shortly prior to arrival, the patient was attempting to skate when she fell and landing on her left wrist.  Denies any other injury or hitting her head.  Denies any blood thinner use.NKDA.    Wrist Injury Associated symptoms: no back pain and no neck pain        Home Medications Prior to Admission medications   Medication Sig Start Date End Date Taking? Authorizing Provider  fluconazole  (DIFLUCAN ) 150 MG tablet Take 1 tablet (150 mg total) by mouth daily. 08/17/23   Saguier, Gaylin Ke, PA-C  fluticasone  (FLONASE ) 50 MCG/ACT nasal spray SPRAY 2 SPRAYS INTO EACH NOSTRIL EVERY DAY Patient taking differently: Place 2 sprays into both nostrils as needed for allergies or rhinitis. 09/15/22   Saguier, Gaylin Ke, PA-C  hydrOXYzine  (ATARAX ) 10 MG tablet Take 1 tablet (10 mg total) by mouth 3 (three) times daily as needed for itching. 08/17/23   Saguier, Gaylin Ke, PA-C  levocetirizine (XYZAL ) 5 MG tablet TAKE 1 TABLET BY MOUTH EVERY DAY IN THE EVENING 01/05/23   Saguier, Gaylin Ke, PA-C  sucralfate  (CARAFATE ) 1 g tablet Take 1 tablet (1 g total) by mouth 4 (four) times daily -  with meals and at bedtime. 03/29/23   Trenton Frock, PA-C  valsartan  (DIOVAN ) 160 MG tablet Take 1 tablet (160 mg total) by mouth daily. 01/03/23   Revankar, Micael Adas, MD      Allergies    Patient has no known allergies.    Review of Systems   Review of Systems  Musculoskeletal:  Positive for arthralgias. Negative for back pain and neck pain.    Physical Exam Updated Vital Signs BP 130/72   Pulse 72   Temp 97.8 F (36.6 C)   Resp 18   LMP  (LMP  Unknown)   SpO2 99%  Physical Exam Vitals and nursing note reviewed.  Constitutional:      General: She is not in acute distress.    Appearance: She is not toxic-appearing.  Eyes:     General: No scleral icterus. Cardiovascular:     Rate and Rhythm: Normal rate.  Pulmonary:     Effort: Pulmonary effort is normal. No respiratory distress.  Musculoskeletal:        General: Tenderness and signs of injury present.     Comments: No tenderness to the left shoulder, humerus, elbow, or proximal forearm.  No tenderness of the fingers or hand however patient does have obvious deformity seen to the wrist/distal forearm.  No laceration or abrasion.  No skin opening identified.  Palpable radial pulse.  Brisk cap refill present in all 5 fingers.  She is able to move her fingers however does have pain.  Compartments are soft.  Some bruising noted.  Sensation reportedly intact distally per patient.  Skin:    General: Skin is warm and dry.  Neurological:     Mental Status: She is alert.     ED Results / Procedures / Treatments   Labs (all labs ordered are listed, but only abnormal results are displayed) Labs Reviewed - No data to display  EKG None  Radiology DG  Wrist 2 Views Left Result Date: 09/04/2023 CLINICAL DATA:  Status post reduction of the left wrist EXAM: LEFT WRIST - 2 VIEW COMPARISON:  Radiographs 09/04/2023 FINDINGS: Similar appearance of the transverse comminuted fracture in the distal radius is redemonstrated with 5 mm dorsal displacement of the distal fragment and slight apex palmar angulation. Mildly displaced ulnar styloid fracture. Improved alignment of the ulna in relation to the carpal bones. IMPRESSION: 1. Similar appearance of the transverse comminuted fracture in the distal radius. 2. Mildly displaced ulnar styloid fracture. 3. Improved alignment of the ulna in relation to the carpal bones. Electronically Signed   By: Rozell Cornet M.D.   On: 09/04/2023 19:07   DG Wrist  Complete Left Result Date: 09/04/2023 CLINICAL DATA:  Marvell Slider skating EXAM: LEFT WRIST - COMPLETE 3+ VIEW COMPARISON:  None Available. FINDINGS: Transverse minimally comminuted fracture across the distal radius with mild impaction and dorsal angulation of the distal radial articular surface. A fracture line extends to the subchondral cortex, with minimal distraction and step-off deformity. Carpal rows appear intact. Distal ulna intact. IMPRESSION: Angulated impacted comminuted distal radius fracture as above. Electronically Signed   By: Nicoletta Barrier M.D.   On: 09/04/2023 16:24    Procedures .Nerve Block  Date/Time: 09/04/2023 6:00 PM  Performed by: Spence Dux, PA-C Authorized by: Spence Dux, PA-C   Consent:    Consent obtained:  Verbal   Consent given by:  Patient   Risks, benefits, and alternatives were discussed: yes     Risks discussed:  Bleeding, pain and unsuccessful block Universal protocol:    Procedure explained and questions answered to patient or proxy's satisfaction: yes     Imaging studies available: yes     Patient identity confirmed:  Verbally with patient Indications:    Indications:  Pain relief Location:    Body area:  Upper extremity   Upper extremity nerve blocked: Hematoma block.   Laterality:  Left Pre-procedure details:    Skin preparation:  Povidone-iodine   Preparation: Patient was prepped and draped in usual sterile fashion   Skin anesthesia:    Skin anesthesia method:  None Procedure details:    Block needle gauge:  22 G   Anesthetic injected:  Bupivacaine  0.5% w/o epi (10cc)   Steroid injected:  None   Additive injected:  None   Injection procedure:  Anatomic landmarks identified, anatomic landmarks palpated, introduced needle and incremental injection Post-procedure details:    Outcome:  Anesthesia achieved   Procedure completion:  Tolerated well, no immediate complications Comments:     Please see reduction procedure note from attending.     Medications Ordered in ED Medications  pantoprazole  (PROTONIX ) EC tablet 40 mg (40 mg Oral Given 09/04/23 1804)  oxyCODONE -acetaminophen  (PERCOCET/ROXICET) 5-325 MG per tablet 1 tablet (1 tablet Oral Given 09/04/23 1626)  bupivacaine (PF) (MARCAINE ) 0.5 % injection 20 mL (20 mLs Infiltration Given 09/04/23 1737)  ondansetron  (ZOFRAN -ODT) disintegrating tablet 4 mg (4 mg Oral Given 09/04/23 1805)    ED Course/ Medical Decision Making/ A&P                               Medical Decision Making Amount and/or Complexity of Data Reviewed Radiology: ordered.  Risk Prescription drug management.   58 y.o. female presents to the ER for evaluation of wrist pain s/p fall. She denies any other injury. Differential diagnosis includes but is not limited to trauma, fracture, dislocation, contusion, soft tissue injury.  Vital signs unremarkable. Physical exam as noted above.   The patient reports that she has acid reflux and was feeling some of those symptoms after the pain medication. Protonix  and Zofran  ordered.   XR imaging shows Angulated impacted comminuted distal radius fracture as above.  Marcaine  injection for hematoma block preformed, please see procedure documentation. Please see my attending's note for reduction. Patient tolerated procedure well. Post- reduction XR ordered for placement. Sugartong splint was placed by ortho staff.   Post reduction X-ray shows 1. Similar appearance of the transverse comminuted fracture in the distal radius. 2. Mildly displaced ulnar styloid fracture. 3. Improved alignment of the ulna in relation to the carpal bones.  Patient eloped from the ER as she reported to staff that she was hungry. Unable to check NV status after splint placement. I was able to get a hold of her on the phone and asked for her to come back to be evaluated and to get her discharge instructions as it contained important information on cast care and the information for the hand surgeon. She  reported that she wanted to continue going home. I did give her the information for Dr. Guyann Leitz over the phone which she repeated back to me for confirmation. She assures me she will call.   Portions of this report may have been transcribed using voice recognition software. Every effort was made to ensure accuracy; however, inadvertent computerized transcription errors may be present.   Final Clinical Impression(s) / ED Diagnoses Final diagnoses:  Closed displaced fracture of styloid process of left ulna, initial encounter  Closed fracture of distal end of left radius, unspecified fracture morphology, initial encounter  Fall, initial encounter    Rx / DC Orders ED Discharge Orders     None         Spence Dux, PA-C 09/06/23 1100    Deatra Face, MD 09/10/23 [phone]

## 2023-09-04 NOTE — ED Triage Notes (Signed)
 PT arrives via POV. PT reports she was attempting to skate, she fell and landed on her left wrist. Obvious deformity noted, sensation is intact distal to injury. Pt denies hitting her head. Pt is AxOx4.

## 2023-09-08 ENCOUNTER — Ambulatory Visit: Payer: Self-pay | Admitting: Medical

## 2023-09-08 DIAGNOSIS — M25532 Pain in left wrist: Secondary | ICD-10-CM | POA: Diagnosis not present

## 2023-09-08 DIAGNOSIS — S52502A Unspecified fracture of the lower end of left radius, initial encounter for closed fracture: Secondary | ICD-10-CM | POA: Diagnosis not present

## 2023-09-08 NOTE — Telephone Encounter (Signed)
 Chief Complaint: Left wrist swelling after breaking wrist on 5/25  Symptoms: Tightness with cast  Disposition: [x] Urgent Care (no appt availability in office)   Additional Notes: This RN advised pt to go to an urgent care as no appointment availability in office.    Copied from CRM [id number]. Topic: Clinical - Red Word Triage >> Sep 08, 2023 10:40 AM Dewanda Foots wrote: Red Word that prompted transfer to Nurse Triage: Pt states that she broke her wrist on 5/25 and is still really swollen.  This is on her left hand, she is left-handed. Reason for Disposition  [1] SEVERE pain (e.g., excruciating, unable to use hand or wrist at all) AND [2] not improved after 2 hours of pain medicine  Answer Assessment - Initial Assessment Questions 1. ONSET: "When did the swelling start?" (e.g., minutes, hours, days, weeks)     Monday 2. LOCATION: "What part of the wrist is swollen?"  "Are both wrists swollen or just one wrist?"     Left wrist 3. SEVERITY: "How bad is the swelling?"    * NONE: No joint swelling.   * SKIN ONLY: Localized; small area of puffy or swollen skin.   * BALL OR LUMP: There is a small firm ball or lump; size of a pea, marble, or grape.   * MILD: Joint looks or feels mildly swollen or puffy.   * MODERATE: Swollen; interferes with normal activities (e.g., work or school); decreased range of movement.   * SEVERE: Very swollen; can't move swollen joint at all; unable to hold a cup of water.     Moderate  Protocols used: Wrist Swelling-A-AH

## 2023-09-09 DIAGNOSIS — X58XXXA Exposure to other specified factors, initial encounter: Secondary | ICD-10-CM | POA: Diagnosis not present

## 2023-09-09 DIAGNOSIS — Y999 Unspecified external cause status: Secondary | ICD-10-CM | POA: Diagnosis not present

## 2023-09-09 DIAGNOSIS — G8918 Other acute postprocedural pain: Secondary | ICD-10-CM | POA: Diagnosis not present

## 2023-09-09 DIAGNOSIS — S52572A Other intraarticular fracture of lower end of left radius, initial encounter for closed fracture: Secondary | ICD-10-CM | POA: Diagnosis not present

## 2023-09-10 NOTE — ED Provider Notes (Signed)
  Physical Exam  BP 130/72   Pulse 72   Temp 97.8 F (36.6 C)   Resp 18   LMP  (LMP Unknown)   SpO2 99%   Physical Exam  Procedures  .Reduction of fracture  Date/Time: 09/10/2023 7:35 AM  Performed by: Deatra Face, MD Authorized by: Deatra Face, MD  Consent: Written consent obtained. Risks and benefits: risks, benefits and alternatives were discussed Consent given by: patient Patient understanding: patient states understanding of the procedure being performed Patient consent: the patient's understanding of the procedure matches consent given Procedure consent: procedure consent matches procedure scheduled Relevant documents: relevant documents present and verified Test results: test results available and properly labeled Site marked: the operative site was marked Imaging studies: imaging studies available Patient identity confirmed: arm band Time out: Immediately prior to procedure a "time out" was called to verify the correct patient, procedure, equipment, support staff and site/side marked as required. Preparation: Patient was prepped and draped in the usual sterile fashion.  Sedation: Patient sedated: no  Patient tolerance: patient tolerated the procedure well with no immediate complications Comments: Hematoma block followed by reduction attempt.     ED Course / MDM    Medical Decision Making Amount and/or Complexity of Data Reviewed Radiology: ordered.  Risk Prescription drug management.    Pt sustained comminuted radial fracture, likely ulnar fracture as well distally. I independently interpreted the x-ray.   Hematoma block completed and reduction was attempted thereafter. Postreduction x-ray shows significant improvement in the AP view, particularly with the ulnar displacement.  The displacement posteriorly persist.  Patient likely will need operative care.  This has been discussed with the patient already.  Hand surgery follow-up will be requested.   Neurovascularly intact.   I provided a substantive portion of the care of this patient.  I personally made/approved the management plan for this patient and take responsibility for the patient management.        Deatra Face, MD 09/10/23 [phone]

## 2023-09-21 DIAGNOSIS — Z4789 Encounter for other orthopedic aftercare: Secondary | ICD-10-CM | POA: Diagnosis not present

## 2023-09-23 DIAGNOSIS — L03011 Cellulitis of right finger: Secondary | ICD-10-CM | POA: Diagnosis not present

## 2023-09-23 DIAGNOSIS — L301 Dyshidrosis [pompholyx]: Secondary | ICD-10-CM | POA: Diagnosis not present

## 2023-09-23 DIAGNOSIS — B351 Tinea unguium: Secondary | ICD-10-CM | POA: Diagnosis not present

## 2023-09-23 DIAGNOSIS — Z79899 Other long term (current) drug therapy: Secondary | ICD-10-CM | POA: Diagnosis not present

## 2023-10-05 DIAGNOSIS — Z4789 Encounter for other orthopedic aftercare: Secondary | ICD-10-CM | POA: Diagnosis not present

## 2023-10-05 DIAGNOSIS — M25632 Stiffness of left wrist, not elsewhere classified: Secondary | ICD-10-CM | POA: Diagnosis not present

## 2023-10-07 ENCOUNTER — Ambulatory Visit (INDEPENDENT_AMBULATORY_CARE_PROVIDER_SITE_OTHER): Admitting: Medical

## 2023-10-07 ENCOUNTER — Encounter: Payer: Self-pay | Admitting: Medical

## 2023-10-07 VITALS — BP 143/80 | HR 64 | Temp 98.2°F | Resp 18 | Ht 67.5 in | Wt 145.4 lb

## 2023-10-07 DIAGNOSIS — Z0000 Encounter for general adult medical examination without abnormal findings: Secondary | ICD-10-CM | POA: Diagnosis not present

## 2023-10-07 DIAGNOSIS — R634 Abnormal weight loss: Secondary | ICD-10-CM | POA: Diagnosis not present

## 2023-10-07 DIAGNOSIS — K219 Gastro-esophageal reflux disease without esophagitis: Secondary | ICD-10-CM | POA: Diagnosis not present

## 2023-10-07 DIAGNOSIS — M79646 Pain in unspecified finger(s): Secondary | ICD-10-CM | POA: Diagnosis not present

## 2023-10-07 DIAGNOSIS — M2550 Pain in unspecified joint: Secondary | ICD-10-CM

## 2023-10-07 DIAGNOSIS — I10 Essential (primary) hypertension: Secondary | ICD-10-CM | POA: Diagnosis not present

## 2023-10-07 LAB — COMPREHENSIVE METABOLIC PANEL WITH GFR
ALT: 12 U/L (ref 0–35)
AST: 17 U/L (ref 0–37)
Albumin: 4.4 g/dL (ref 3.5–5.2)
Alkaline Phosphatase: 75 U/L (ref 39–117)
BUN: 18 mg/dL (ref 6–23)
CO2: 29 meq/L (ref 19–32)
Calcium: 9.8 mg/dL (ref 8.4–10.5)
Chloride: 104 meq/L (ref 96–112)
Creatinine, Ser: 0.84 mg/dL (ref 0.40–1.20)
GFR: 76.21 mL/min (ref >60.00)
Glucose, Bld: 78 mg/dL (ref 70–99)
Potassium: 4 meq/L (ref 3.5–5.1)
Sodium: 139 meq/L (ref 135–145)
Total Bilirubin: 0.6 mg/dL (ref 0.2–1.2)
Total Protein: 7.4 g/dL (ref 6.0–8.3)

## 2023-10-07 LAB — LIPID PANEL
Cholesterol: 233 mg/dL — ABNORMAL HIGH (ref 0–200)
HDL: 80.7 mg/dL (ref >39.00)
LDL Cholesterol: 141 mg/dL — ABNORMAL HIGH (ref 0–99)
NonHDL: 152.36
Total CHOL/HDL Ratio: 3
Triglycerides: 59 mg/dL (ref 0.0–149.0)
VLDL: 11.8 mg/dL (ref 0.0–40.0)

## 2023-10-07 LAB — URIC ACID: Uric Acid, Serum: 4.8 mg/dL (ref 2.4–7.0)

## 2023-10-07 LAB — CBC WITH DIFFERENTIAL/PLATELET
Basophils Absolute: 0 K/uL (ref 0.0–0.1)
Basophils Relative: 0.4 % (ref 0.0–3.0)
Eosinophils Absolute: 0 K/uL (ref 0.0–0.7)
Eosinophils Relative: 0.7 % (ref 0.0–5.0)
HCT: 40.2 % (ref 36.0–46.0)
Hemoglobin: 13.5 g/dL (ref 12.0–15.0)
Lymphocytes Relative: 34.9 % (ref 12.0–46.0)
Lymphs Abs: 1.2 K/uL (ref 0.7–4.0)
MCHC: 33.5 g/dL (ref 30.0–36.0)
MCV: 87.4 fl (ref 78.0–100.0)
Monocytes Absolute: 0.3 K/uL (ref 0.1–1.0)
Monocytes Relative: 9.9 % (ref 3.0–12.0)
Neutro Abs: 1.8 K/uL (ref 1.4–7.7)
Neutrophils Relative %: 54.1 % (ref 43.0–77.0)
Platelets: 165 K/uL (ref 150.0–400.0)
RBC: 4.6 Mil/uL (ref 3.87–5.11)
RDW: 13.6 % (ref 11.5–15.5)
WBC: 3.4 K/uL — ABNORMAL LOW (ref 4.0–10.5)

## 2023-10-07 NOTE — Progress Notes (Signed)
 Subjective:    Patient ID: Erica [name], female    DOB: 1965-07-19, 58 y.o.   MRN: [id number]  HPI   Pt in for wellness exam. Pt is fasting.    Pt recently not working due to left wrist fracture. Going back to work on July 12, Pt has not been exercising but plans to walk around track at [employer]. Pt diet recently poor as she admits trying to gain weight. Non smoker and no alcohol. No caffeine beverages.  Pt up to date on mammogram and colonoscopy. Up to date on pap smear. Done 12-29-2021.    Pt brings up today weight loss. Coincided with change in work schedule and when she stopped eating after 4 pm due to reflux later in day if she would eat after 4 pm. Recently started to eat more in attempt to gain weight. Last weight on last visit in May was 151 lb.   Gerd history- pt states that has come back recently as she is eating later recently as trying to gain weight. Pt on pepcid  as needed. Taking about every 2-3 days on average over past 2 months.  Also mentions joint pain at dip joints on both hands. She correlated with that eating fish/salmon. Pt thought she may have gout. She stopped eating fish and pain in fingers resolved. Pain in fingers for about a year or more. No pain present for 2 weeks now.  Htn- bp was initially very high 168/86. She rushed to get to office today. She has taken bp meds today. Pt on valsartan  160 mg daily. Pt has not checked her bp recently. On last visit wanted her to - Check blood pressure at home every other day for 10 days and update via MyChart. Did not get update.          Review of Systems  Constitutional:  Positive for unexpected weight change. Negative for chills and fatigue.  HENT:  Negative for congestion, ear pain and hearing loss.   Respiratory:  Negative for chest tightness, shortness of breath and wheezing.   Cardiovascular:  Negative for chest pain and palpitations.  Gastrointestinal:  Negative for abdominal distention, abdominal pain,  blood in stool, diarrhea and vomiting.  Genitourinary:  Negative for dysuria, flank pain and frequency.  Musculoskeletal:  Negative for gait problem.  Neurological:  Negative for dizziness, syncope, weakness and light-headedness.  Hematological:  Negative for adenopathy. Does not bruise/bleed easily.  Psychiatric/Behavioral:  Negative for behavioral problems and confusion. The patient is not nervous/anxious.     Past Medical History:  Diagnosis Date   Actinic skin damage 12/29/2022   Anxiety 09/24/2021   Chest pain of uncertain etiology 10/03/2018   Chronic constipation 09/24/2021   Dyspepsia 07/10/2018   Essential hypertension 08/04/2006   Essential hypertension, benign    Family history of malignant neoplasm of breast 09/24/2021   Fibroids    GERD (gastroesophageal reflux disease)    Hemangioma of skin and subcutaneous tissue 12/29/2022   HH (hiatus hernia)    Hx of adenomatous colonic polyps    IBS 08/04/2006   Iron deficiency anemia 09/24/2021   Lentigo 12/29/2022   Liver hemangioma, giant, incidental finding 09/06/2018   Guidelines recommend that you get a contrast-enhanced MRI liver in 6-12 months to make sure it is not significantly increasing in size.  Symptoms such as abdominal pain or bloating would not be very reliable indicators of the activity at this hemangioma.  If she should develop sudden right upper quadrant or flank pain  associated with fever and elevated LFTs, it could indicate bleeding into the hem   Menorrhagia 09/24/2021   Osteopenia 10/2016   SK (seborrheic keratosis) 12/29/2022   Skin sensation disturbance 09/24/2021   Vitamin D  deficiency 11/26/2016     Social History   Socioeconomic History   Marital status: Single    Spouse name: Not on file   Number of children: 0   Years of education: Not on file   Highest education level: Not on file  Occupational History   Occupation: MI service specialist    Employer: [employer]  Tobacco Use    Smoking status: Never    Passive exposure: Never   Smokeless tobacco: Never  Vaping Use   Vaping status: Never Used  Substance and Sexual Activity   Alcohol use: No   Drug use: No   Sexual activity: Not Currently    Birth control/protection: Abstinence, Post-menopausal  Other Topics Concern   Not on file  Social History Narrative   Not on file   Social Drivers of Health   Financial Resource Strain: Not on file  Food Insecurity: Not on file  Transportation Needs: Not on file  Physical Activity: Not on file  Stress: Not on file  Social Connections: Not on file  Intimate Partner Violence: Not on file    Past Surgical History:  Procedure Laterality Date   BREAST BIOPSY Left 12/03/2021   COLONOSCOPY  01/01/2005   Normal    POLYPECTOMY     UTERINE FIBROID EMBOLIZATION      Family History  Problem Relation Age of Onset   Hodgkin's lymphoma Mother    Alzheimer's disease Father    Breast cancer Sister    Breast cancer Maternal Aunt    Colon cancer Maternal Grandfather    Stroke Paternal Grandfather    Esophageal cancer Neg Hx    Stomach cancer Neg Hx    Rectal cancer Neg Hx    Pancreatic cancer Neg Hx     No Known Allergies  Current Outpatient Medications on File Prior to Visit  Medication Sig Dispense Refill   fluconazole  (DIFLUCAN ) 150 MG tablet Take 1 tablet (150 mg total) by mouth daily. 1 tablet 0   fluticasone  (FLONASE ) 50 MCG/ACT nasal spray SPRAY 2 SPRAYS INTO EACH NOSTRIL EVERY DAY (Patient taking differently: Place 2 sprays into both nostrils as needed for allergies or rhinitis.) 48 mL 2   hydrOXYzine  (ATARAX ) 10 MG tablet Take 1 tablet (10 mg total) by mouth 3 (three) times daily as needed for itching. 30 tablet 0   levocetirizine (XYZAL ) 5 MG tablet TAKE 1 TABLET BY MOUTH EVERY DAY IN THE EVENING 90 tablet 2   ondansetron  (ZOFRAN -ODT) 4 MG disintegrating tablet Take 1 tablet (4 mg total) by mouth every 8 (eight) hours as needed for nausea or vomiting. 15  tablet 0   oxyCODONE -acetaminophen  (PERCOCET/ROXICET) 5-325 MG tablet Take 1 tablet by mouth every 6 (six) hours as needed for severe pain (pain score 7-10). 15 tablet 0   sucralfate  (CARAFATE ) 1 g tablet Take 1 tablet (1 g total) by mouth 4 (four) times daily -  with meals and at bedtime. 28 tablet 0   valsartan  (DIOVAN ) 160 MG tablet Take 1 tablet (160 mg total) by mouth daily. 90 tablet 3   No current facility-administered medications on file prior to visit.    BP (!) 150/80   Pulse 64   Temp 98.2 F (36.8 C)   Resp 18   Ht 5' 7.5 (1.715  m)   Wt 145 lb 6.4 oz (66 kg)   LMP  (LMP Unknown)   SpO2 97%   BMI 22.44 kg/m         Objective:   Physical Exam  General Mental Status- Alert. General Appearance- Not in acute distress.   Skin General: Color- Normal Color. Moisture- Normal Moisture.  Neck Carotid Arteries- Normal color. Moisture- Normal Moisture. No carotid bruits. No JVD.  Chest and Lung Exam Auscultation: Breath Sounds:-CTA  Cardiovascular Auscultation:Rythm- RRR Murmurs & Other Heart Sounds:Auscultation of the heart reveals- No Murmurs.  Abdomen Inspection:-Inspeection Normal. Palpation/Percussion:Note:No mass. Palpation and Percussion of the abdomen reveal- Non Tender, Non Distended + BS, no rebound or guarding.   Neurologic Cranial Nerve exam:- CN III-XII intact(No nystagmus), symmetric smile. Strength:- 5/5 equal and symmetric strength both upper and lower extremities.       Assessment & Plan:   Patient Instructions  For you wellness exam today I have ordered cbc, cmp and lipid panel.  Vaccine none given today. Hep b vaccine on list to update. Can ask your insurance if they cover based on her age and med history.  Recommend exercise and healthy diet.  Up to date on colonoscopy, pap and mammogram.  We will let you know lab results as they come in.  Follow up date appointment will be determined after lab review.     Gerd- Advise gerd  diet and can continue pepcid .  Wt loss- Appears diet related as you cut back. Try to eat more healthy foods but more calories. Want to confirm can gain back 5 lbs or more. If continue wt loss then need to work up  Finger/joint pains- uric acid today. Discussed possible gout.  Htn- bp better after recheck. Continue valsartan  160 mg daily. Check blood pressure at home every other day for 10 days and update via MyChart. Did not get update.       Dallas Maxwell, PA-C    [id number] charge in additon to wellness exam as did address and discuss weight loss, gerd, htn and joint pains.

## 2023-10-07 NOTE — Patient Instructions (Addendum)
 For you wellness exam today I have ordered cbc, cmp and lipid panel.  Vaccine none given today. Hep b vaccine on list to update. Can ask your insurance if they cover based on her age and med history.  Recommend exercise and healthy diet.  Up to date on colonoscopy, pap and mammogram.  We will let you know lab results as they come in.  Follow up date appointment will be determined after lab review.     Gerd- Advise gerd diet and can continue pepcid .  Wt loss- Appears diet related as you cut back. Try to eat more healthy foods but more calories. Want to confirm can gain back 5 lbs or more. If continue wt loss then need to work up  Finger/joint pains- uric acid today. Discussed possible gout.  Htn- bp better after recheck. Continue valsartan  160 mg daily. Check blood pressure at home every other day for 10 days and update via MyChart. Did not get update.   Preventive Care 59-12 Years Old, Female Preventive care refers to lifestyle choices and visits with your health care provider that can promote health and wellness. Preventive care visits are also called wellness exams. What can I expect for my preventive care visit? Counseling Your health care provider may ask you questions about your: Medical history, including: Past medical problems. Family medical history. Pregnancy history. Current health, including: Menstrual cycle. Method of birth control. Emotional well-being. Home life and relationship well-being. Sexual activity and sexual health. Lifestyle, including: Alcohol, nicotine or tobacco, and drug use. Access to firearms. Diet, exercise, and sleep habits. Work and work Astronomer. Sunscreen use. Safety issues such as seatbelt and bike helmet use. Physical exam Your health care provider will check your: Height and weight. These may be used to calculate your BMI (body mass index). BMI is a measurement that tells if you are at a healthy weight. Waist circumference. This  measures the distance around your waistline. This measurement also tells if you are at a healthy weight and may help predict your risk of certain diseases, such as type 2 diabetes and high blood pressure. Heart rate and blood pressure. Body temperature. Skin for abnormal spots. What immunizations do I need?  Vaccines are usually given at various ages, according to a schedule. Your health care provider will recommend vaccines for you based on your age, medical history, and lifestyle or other factors, such as travel or where you work. What tests do I need? Screening Your health care provider may recommend screening tests for certain conditions. This may include: Lipid and cholesterol levels. Diabetes screening. This is done by checking your blood sugar (glucose) after you have not eaten for a while (fasting). Pelvic exam and Pap test. Hepatitis B test. Hepatitis C test. HIV (human immunodeficiency virus) test. STI (sexually transmitted infection) testing, if you are at risk. Lung cancer screening. Colorectal cancer screening. Mammogram. Talk with your health care provider about when you should start having regular mammograms. This may depend on whether you have a family history of breast cancer. BRCA-related cancer screening. This may be done if you have a family history of breast, ovarian, tubal, or peritoneal cancers. Bone density scan. This is done to screen for osteoporosis. Talk with your health care provider about your test results, treatment options, and if necessary, the need for more tests. Follow these instructions at home: Eating and drinking  Eat a diet that includes fresh fruits and vegetables, whole grains, lean protein, and low-fat dairy products. Take vitamin and mineral supplements  as recommended by your health care provider. Do not drink alcohol if: Your health care provider tells you not to drink. You are pregnant, may be pregnant, or are planning to become  pregnant. If you drink alcohol: Limit how much you have to 0-1 drink a day. Know how much alcohol is in your drink. In the [place] one drink equals one 12 oz bottle of beer (355 mL), one 5 oz glass of wine (148 mL), or one 1 oz glass of hard liquor (44 mL). Lifestyle Brush your teeth every morning and night with fluoride toothpaste. Floss one time each day. Exercise for at least 30 minutes 5 or more days each week. Do not use any products that contain nicotine or tobacco. These products include cigarettes, chewing tobacco, and vaping devices, such as e-cigarettes. If you need help quitting, ask your health care provider. Do not use drugs. If you are sexually active, practice safe sex. Use a condom or other form of protection to prevent STIs. If you do not wish to become pregnant, use a form of birth control. If you plan to become pregnant, see your health care provider for a prepregnancy visit. Take aspirin only as told by your health care provider. Make sure that you understand how much to take and what form to take. Work with your health care provider to find out whether it is safe and beneficial for you to take aspirin daily. Find healthy ways to manage stress, such as: Meditation, yoga, or listening to music. Journaling. Talking to a trusted person. Spending time with friends and family. Minimize exposure to UV radiation to reduce your risk of skin cancer. Safety Always wear your seat belt while driving or riding in a vehicle. Do not drive: If you have been drinking alcohol. Do not ride with someone who has been drinking. When you are tired or distracted. While texting. If you have been using any mind-altering substances or drugs. Wear a helmet and other protective equipment during sports activities. If you have firearms in your house, make sure you follow all gun safety procedures. Seek help if you have been physically or sexually abused. What's next? Visit your health care provider  once a year for an annual wellness visit. Ask your health care provider how often you should have your eyes and teeth checked. Stay up to date on all vaccines. This information is not intended to replace advice given to you by your health care provider. Make sure you discuss any questions you have with your health care provider. Document Revised: 09/24/2020 Document Reviewed: 09/24/2020 Elsevier Patient Education  2024 [employer].

## 2023-10-08 ENCOUNTER — Ambulatory Visit: Payer: Self-pay | Admitting: Medical

## 2023-10-10 DIAGNOSIS — M25632 Stiffness of left wrist, not elsewhere classified: Secondary | ICD-10-CM | POA: Diagnosis not present

## 2023-10-20 ENCOUNTER — Other Ambulatory Visit: Payer: Self-pay | Admitting: Medical

## 2023-10-20 DIAGNOSIS — Z1231 Encounter for screening mammogram for malignant neoplasm of breast: Secondary | ICD-10-CM

## 2023-10-20 DIAGNOSIS — F329 Major depressive disorder, single episode, unspecified: Secondary | ICD-10-CM | POA: Diagnosis not present

## 2023-10-20 DIAGNOSIS — M25632 Stiffness of left wrist, not elsewhere classified: Secondary | ICD-10-CM | POA: Diagnosis not present

## 2023-10-20 DIAGNOSIS — F419 Anxiety disorder, unspecified: Secondary | ICD-10-CM | POA: Diagnosis not present

## 2023-10-25 DIAGNOSIS — F329 Major depressive disorder, single episode, unspecified: Secondary | ICD-10-CM | POA: Diagnosis not present

## 2023-10-25 DIAGNOSIS — F419 Anxiety disorder, unspecified: Secondary | ICD-10-CM | POA: Diagnosis not present

## 2023-11-01 DIAGNOSIS — M25632 Stiffness of left wrist, not elsewhere classified: Secondary | ICD-10-CM | POA: Diagnosis not present

## 2023-11-10 DIAGNOSIS — F329 Major depressive disorder, single episode, unspecified: Secondary | ICD-10-CM | POA: Diagnosis not present

## 2023-11-10 DIAGNOSIS — F419 Anxiety disorder, unspecified: Secondary | ICD-10-CM | POA: Diagnosis not present

## 2023-11-16 ENCOUNTER — Ambulatory Visit: Admitting: Obstetrics

## 2023-11-16 ENCOUNTER — Ambulatory Visit

## 2023-11-16 DIAGNOSIS — S52502D Unspecified fracture of the lower end of left radius, subsequent encounter for closed fracture with routine healing: Secondary | ICD-10-CM | POA: Diagnosis not present

## 2023-11-16 DIAGNOSIS — M25632 Stiffness of left wrist, not elsewhere classified: Secondary | ICD-10-CM | POA: Diagnosis not present

## 2023-11-18 ENCOUNTER — Ambulatory Visit
Admission: RE | Admit: 2023-11-18 | Discharge: 2023-11-18 | Disposition: A | Discharge status: Home / Self Care | Source: Ambulatory Visit | Attending: Medical | Admitting: Medical

## 2023-11-18 ENCOUNTER — Ambulatory Visit

## 2023-11-18 DIAGNOSIS — Z1231 Encounter for screening mammogram for malignant neoplasm of breast: Secondary | ICD-10-CM

## 2023-11-28 ENCOUNTER — Ambulatory Visit: Admitting: Dermatology

## 2023-11-28 DIAGNOSIS — M25632 Stiffness of left wrist, not elsewhere classified: Secondary | ICD-10-CM | POA: Diagnosis not present

## 2023-12-16 DIAGNOSIS — F329 Major depressive disorder, single episode, unspecified: Secondary | ICD-10-CM | POA: Diagnosis not present

## 2023-12-16 DIAGNOSIS — F419 Anxiety disorder, unspecified: Secondary | ICD-10-CM | POA: Diagnosis not present

## 2023-12-25 ENCOUNTER — Other Ambulatory Visit: Payer: Self-pay | Admitting: Cardiology

## 2023-12-28 ENCOUNTER — Telehealth: Payer: Self-pay | Admitting: Cardiology

## 2023-12-28 MED ORDER — VALSARTAN 160 MG PO TABS: 160.0000 mg | ORAL_TABLET | Freq: Every day | ORAL | 0 refills | Status: DC

## 2023-12-28 NOTE — Telephone Encounter (Signed)
 Pt's medication was sent to pt's pharmacy as requested. Confirmation received.

## 2023-12-28 NOTE — Telephone Encounter (Signed)
*  STAT* If patient is at the pharmacy, call can be transferred to refill team.   1. Which medications need to be refilled? (please list name of each medication and dose if known)   valsartan  (DIOVAN ) 160 MG tablet    2. Which pharmacy/location (including street and city if local pharmacy) is medication to be sent to?  [id number] - [place], [place] - [address]      3. Do they need a 30 day or 90 day supply? 90 day

## 2024-01-04 ENCOUNTER — Encounter: Admitting: Medical

## 2024-01-09 ENCOUNTER — Other Ambulatory Visit (HOSPITAL_COMMUNITY)
Admission: RE | Admit: 2024-01-09 | Discharge: 2024-01-09 | Disposition: A | Discharge status: Home / Self Care | Source: Ambulatory Visit | Attending: Obstetrics | Admitting: Obstetrics

## 2024-01-09 ENCOUNTER — Encounter: Payer: Self-pay | Admitting: Obstetrics

## 2024-01-09 ENCOUNTER — Ambulatory Visit (INDEPENDENT_AMBULATORY_CARE_PROVIDER_SITE_OTHER): Admitting: Obstetrics

## 2024-01-09 VITALS — BP 137/90 | HR 71 | Ht 67.5 in | Wt 147.7 lb

## 2024-01-09 DIAGNOSIS — D219 Benign neoplasm of connective and other soft tissue, unspecified: Secondary | ICD-10-CM

## 2024-01-09 DIAGNOSIS — Z803 Family history of malignant neoplasm of breast: Secondary | ICD-10-CM

## 2024-01-09 DIAGNOSIS — F419 Anxiety disorder, unspecified: Secondary | ICD-10-CM

## 2024-01-09 DIAGNOSIS — Z01419 Encounter for gynecological examination (general) (routine) without abnormal findings: Secondary | ICD-10-CM | POA: Insufficient documentation

## 2024-01-09 DIAGNOSIS — I10 Essential (primary) hypertension: Secondary | ICD-10-CM

## 2024-01-09 DIAGNOSIS — L814 Other melanin hyperpigmentation: Secondary | ICD-10-CM

## 2024-01-09 DIAGNOSIS — Z860101 Personal history of adenomatous and serrated colon polyps: Secondary | ICD-10-CM

## 2024-01-09 DIAGNOSIS — M8589 Other specified disorders of bone density and structure, multiple sites: Secondary | ICD-10-CM

## 2024-01-09 NOTE — Progress Notes (Signed)
 Pt presents for annual. Pt has no questions or concerns at this time.

## 2024-01-09 NOTE — Progress Notes (Signed)
 Subjective:        Erica [name] is a 58 y.o. female here for a routine exam.  Current complaints: None.    Personal health questionnaire:  Is patient [name], have a family history of breast and/or ovarian cancer: yes Is there a family history of uterine cancer diagnosed at age < 70, gastrointestinal cancer, urinary tract cancer, family member who is a Personnel officer syndrome-associated carrier: yes Is the patient overweight and hypertensive, family history of diabetes, personal history of gestational diabetes, preeclampsia or PCOS: no Is patient over 52, have PCOS,  family history of premature CHD under age 22, diabetes, smoke, have hypertension or peripheral artery disease:  yes At any time, has a partner hit, kicked or otherwise hurt or frightened you?: no Over the past 2 weeks, have you felt down, depressed or hopeless?: no Over the past 2 weeks, have you felt little interest or pleasure in doing things?:no   Gynecologic History No LMP recorded (lmp unknown). Patient is postmenopausal. Contraception: post menopausal status Last Pap: 2023. Results were: normal Last mammogram: 08/15/2023. Results were: normal  Obstetric History OB History  No obstetric history on file.    Past Medical History:  Diagnosis Date   Actinic skin damage 12/29/2022   Anxiety 09/24/2021   Chest pain of uncertain etiology 10/03/2018   Chronic constipation 09/24/2021   Dyspepsia 07/10/2018   Essential hypertension 08/04/2006   Essential hypertension, benign    Family history of malignant neoplasm of breast 09/24/2021   Fibroids    GERD (gastroesophageal reflux disease)    Hemangioma of skin and subcutaneous tissue 12/29/2022   HH (hiatus hernia)    Hx of adenomatous colonic polyps    IBS 08/04/2006   Iron deficiency anemia 09/24/2021   Lentigo 12/29/2022   Liver hemangioma, giant, incidental finding 09/06/2018   Guidelines recommend that you get a contrast-enhanced MRI liver in 6-12  months to make sure it is not significantly increasing in size.  Symptoms such as abdominal pain or bloating would not be very reliable indicators of the activity at this hemangioma.  If she should develop sudden right upper quadrant or flank pain associated with fever and elevated LFTs, it could indicate bleeding into the hem   Menorrhagia 09/24/2021   Osteopenia 10/2016   SK (seborrheic keratosis) 12/29/2022   Skin sensation disturbance 09/24/2021   Vitamin D  deficiency 11/26/2016    Past Surgical History:  Procedure Laterality Date   BREAST BIOPSY Left 12/03/2021   COLONOSCOPY  01/01/2005   Normal    POLYPECTOMY     UTERINE FIBROID EMBOLIZATION       Current Outpatient Medications:    fluticasone  (FLONASE ) 50 MCG/ACT nasal spray, SPRAY 2 SPRAYS INTO EACH NOSTRIL EVERY DAY, Disp: 48 mL, Rfl: 2   valsartan  (DIOVAN ) 160 MG tablet, Take 1 tablet (160 mg total) by mouth daily., Disp: 90 tablet, Rfl: 0   fluconazole  (DIFLUCAN ) 150 MG tablet, Take 1 tablet (150 mg total) by mouth daily. (Patient not taking: Reported on 01/09/2024), Disp: 1 tablet, Rfl: 0   hydrOXYzine  (ATARAX ) 10 MG tablet, Take 1 tablet (10 mg total) by mouth 3 (three) times daily as needed for itching. (Patient not taking: Reported on 01/09/2024), Disp: 30 tablet, Rfl: 0   levocetirizine (XYZAL ) 5 MG tablet, TAKE 1 TABLET BY MOUTH EVERY DAY IN THE EVENING (Patient not taking: Reported on 01/09/2024), Disp: 90 tablet, Rfl: 2   ondansetron  (ZOFRAN -ODT) 4 MG disintegrating tablet, Take 1 tablet (4 mg total) by  mouth every 8 (eight) hours as needed for nausea or vomiting. (Patient not taking: Reported on 01/09/2024), Disp: 15 tablet, Rfl: 0   oxyCODONE -acetaminophen  (PERCOCET/ROXICET) 5-325 MG tablet, Take 1 tablet by mouth every 6 (six) hours as needed for severe pain (pain score 7-10). (Patient not taking: Reported on 01/09/2024), Disp: 15 tablet, Rfl: 0   sucralfate  (CARAFATE ) 1 g tablet, Take 1 tablet (1 g total) by mouth 4 (four)  times daily -  with meals and at bedtime. (Patient not taking: Reported on 01/09/2024), Disp: 28 tablet, Rfl: 0 No Known Allergies  Social History   Tobacco Use   Smoking status: Never    Passive exposure: Never   Smokeless tobacco: Never  Substance Use Topics   Alcohol use: No    Family History  Problem Relation Age of Onset   Hodgkin's lymphoma Mother    Alzheimer's disease Father    Breast cancer Sister    Breast cancer Maternal Aunt    Colon cancer Maternal Grandfather    Stroke Paternal Grandfather    Esophageal cancer Neg Hx    Stomach cancer Neg Hx    Rectal cancer Neg Hx    Pancreatic cancer Neg Hx       Review of Systems  Constitutional: negative for fatigue and weight loss Respiratory: negative for cough and wheezing Cardiovascular: negative for chest pain, fatigue and palpitations Gastrointestinal: negative for abdominal pain and change in bowel habits Musculoskeletal:negative for myalgias Neurological: negative for gait problems and tremors Behavioral/Psych:  positive for anxiety.  negative for abusive relationship, depression Endocrine: negative for temperature intolerance    Genitourinary: positive mild for hot flashes and menopausal atrophic vaginitis irritation.  negative for abnormal menstrual periods, genital lesions, sexual problems and vaginal discharge Integument/breast: negative for breast lump, breast tenderness, nipple discharge and skin lesion(s)    Objective:       BP (!) 137/90   Pulse 71   Ht 5' 7.5 (1.715 m)   Wt 147 lb 11.2 oz (67 kg)   LMP  (LMP Unknown)   BMI 22.79 kg/m  General:   Alert and no distress  Skin:   no rash or abnormalities  Lungs:   clear to auscultation bilaterally  Heart:   regular rate and rhythm, S1, S2 normal, no murmur, click, rub or gallop  Breasts:   normal without suspicious masses, skin or nipple changes or axillary nodes  Abdomen:  normal findings: no organomegaly, soft, non-tender and no hernia  Pelvis:   External genitalia: normal general appearance Urinary system: urethral meatus normal and bladder without fullness, nontender Vaginal: normal without tenderness, induration or masses Cervix: normal appearance Adnexa: normal bimanual exam Uterus: anteverted and non-tender, normal size   Lab Review Urine pregnancy test Labs reviewed yes Radiologic studies reviewed yes  I have spent a total of 20 minutes of face-to-face time, excluding clinical staff time, reviewing notes and preparing to see patient, ordering tests and/or medications, and counseling the patient.   Assessment:    1. Encounter for routine gynecological examination with Papanicolaou smear of cervix (Primary) Rx: - Cytology - PAP( [facility])  2. Fibroids - asymptomatic  3. Family history of malignant neoplasm of breast  4. Essential hypertension - clinically stable  5. Lentigo  6. Hx of adenomatous colonic polyps  7. Osteopenia of multiple sites  8. Anxiety - clinically stable    Plan:    Education reviewed: calcium supplements, depression evaluation, low fat, low cholesterol diet, safe sex/STD prevention, self breast exams,  and weight bearing exercise. Follow up in: 1 year.    CARLIN RONAL CENTERS, MD, FACOG Attending Obstetrician & Gynecologist, [facility] for [facility], [facility], [place] 01/09/2024

## 2024-01-11 LAB — CYTOLOGY - PAP
Comment: NEGATIVE
Diagnosis: NEGATIVE
High risk HPV: NEGATIVE

## 2024-02-08 DIAGNOSIS — F329 Major depressive disorder, single episode, unspecified: Secondary | ICD-10-CM | POA: Diagnosis not present

## 2024-02-08 DIAGNOSIS — F419 Anxiety disorder, unspecified: Secondary | ICD-10-CM | POA: Diagnosis not present

## 2024-02-17 DIAGNOSIS — F419 Anxiety disorder, unspecified: Secondary | ICD-10-CM | POA: Diagnosis not present

## 2024-02-17 DIAGNOSIS — F329 Major depressive disorder, single episode, unspecified: Secondary | ICD-10-CM | POA: Diagnosis not present

## 2024-02-22 ENCOUNTER — Ambulatory Visit: Admitting: Cardiology

## 2024-02-28 ENCOUNTER — Encounter: Payer: Self-pay | Admitting: Medical

## 2024-02-28 ENCOUNTER — Ambulatory Visit
Admission: RE | Admit: 2024-02-28 | Discharge: 2024-02-28 | Disposition: A | Discharge status: Home / Self Care | Source: Ambulatory Visit | Attending: Medical | Admitting: Medical

## 2024-02-28 ENCOUNTER — Ambulatory Visit: Payer: Self-pay | Admitting: Medical

## 2024-02-28 ENCOUNTER — Ambulatory Visit: Admitting: Medical

## 2024-02-28 VITALS — BP 135/80 | HR 75 | Temp 97.6°F | Resp 14 | Ht 67.5 in | Wt 146.8 lb

## 2024-02-28 DIAGNOSIS — M79641 Pain in right hand: Secondary | ICD-10-CM | POA: Diagnosis not present

## 2024-02-28 DIAGNOSIS — M19041 Primary osteoarthritis, right hand: Secondary | ICD-10-CM | POA: Diagnosis not present

## 2024-02-28 DIAGNOSIS — E785 Hyperlipidemia, unspecified: Secondary | ICD-10-CM | POA: Diagnosis not present

## 2024-02-28 DIAGNOSIS — E559 Vitamin D deficiency, unspecified: Secondary | ICD-10-CM

## 2024-02-28 DIAGNOSIS — R5383 Other fatigue: Secondary | ICD-10-CM

## 2024-02-28 LAB — LIPID PANEL
Cholesterol: 210 mg/dL — ABNORMAL HIGH (ref 0–200)
HDL: 81.1 mg/dL (ref >39.00)
LDL Cholesterol: 117 mg/dL — ABNORMAL HIGH (ref 0–99)
NonHDL: 128.86
Total CHOL/HDL Ratio: 3
Triglycerides: 61 mg/dL (ref 0.0–149.0)
VLDL: 12.2 mg/dL (ref 0.0–40.0)

## 2024-02-28 LAB — COMPREHENSIVE METABOLIC PANEL WITH GFR
ALT: 12 U/L (ref 0–35)
AST: 16 U/L (ref 0–37)
Albumin: 4.1 g/dL (ref 3.5–5.2)
Alkaline Phosphatase: 65 U/L (ref 39–117)
BUN: 17 mg/dL (ref 6–23)
CO2: 28 meq/L (ref 19–32)
Calcium: 9.5 mg/dL (ref 8.4–10.5)
Chloride: 102 meq/L (ref 96–112)
Creatinine, Ser: 0.88 mg/dL (ref 0.40–1.20)
GFR: 71.87 mL/min (ref >60.00)
Glucose, Bld: 87 mg/dL (ref 70–99)
Potassium: 3.9 meq/L (ref 3.5–5.1)
Sodium: 138 meq/L (ref 135–145)
Total Bilirubin: 0.8 mg/dL (ref 0.2–1.2)
Total Protein: 6.8 g/dL (ref 6.0–8.3)

## 2024-02-28 LAB — CBC WITH DIFFERENTIAL/PLATELET
Basophils Absolute: 0 K/uL (ref 0.0–0.1)
Basophils Relative: 0.4 % (ref 0.0–3.0)
Eosinophils Absolute: 0 K/uL (ref 0.0–0.7)
Eosinophils Relative: 0.7 % (ref 0.0–5.0)
HCT: 40.4 % (ref 36.0–46.0)
Hemoglobin: 13.4 g/dL (ref 12.0–15.0)
Lymphocytes Relative: 36.2 % (ref 12.0–46.0)
Lymphs Abs: 1.2 K/uL (ref 0.7–4.0)
MCHC: 33.3 g/dL (ref 30.0–36.0)
MCV: 88.2 fl (ref 78.0–100.0)
Monocytes Absolute: 0.3 K/uL (ref 0.1–1.0)
Monocytes Relative: 9.3 % (ref 3.0–12.0)
Neutro Abs: 1.7 K/uL (ref 1.4–7.7)
Neutrophils Relative %: 53.4 % (ref 43.0–77.0)
Platelets: 156 K/uL (ref 150.0–400.0)
RBC: 4.58 Mil/uL (ref 3.87–5.11)
RDW: 13.4 % (ref 11.5–15.5)
WBC: 3.2 K/uL — ABNORMAL LOW (ref 4.0–10.5)

## 2024-02-28 LAB — VITAMIN B12: Vitamin B-12: 620 pg/mL (ref 211–911)

## 2024-02-28 LAB — T4, FREE: Free T4: 1.09 ng/dL (ref 0.60–1.60)

## 2024-02-28 LAB — VITAMIN D 25 HYDROXY (VIT D DEFICIENCY, FRACTURES): VITD: 18.46 ng/mL — ABNORMAL LOW (ref 30.00–100.00)

## 2024-02-28 LAB — TSH: TSH: 1.07 u[IU]/mL (ref 0.35–5.50)

## 2024-02-28 MED ORDER — ATORVASTATIN CALCIUM 10 MG PO TABS: 10.0000 mg | ORAL_TABLET | Freq: Every day | ORAL | 3 refills | Status: AC

## 2024-02-28 MED ORDER — VITAMIN D (ERGOCALCIFEROL) 1.25 MG (50000 UNIT) PO CAPS: 50000.0000 [IU] | ORAL_CAPSULE | ORAL | 0 refills | Status: AC

## 2024-02-28 NOTE — Addendum Note (Signed)
 Addended by: DORINA DALLAS HERO on: 02/28/2024 10:08 PM   Modules accepted: Orders

## 2024-02-28 NOTE — Patient Instructions (Addendum)
 Fatigue Intermittent fatigue possibly due to disturbed sleep, vitamin D  deficiency, or metabolic issues. No anemia. Dietary changes and work shift adjustments may contribute. - Ordered CBC, metabolic panel, B12, B1, thyroid  studies, and cholesterol check. - Rechecked vitamin D  levels.  Disturbed sleep (insomnia) Disrupted sleep with difficulty staying asleep, possibly related to work shift and dietary habits. Sleep tracking indicates more sleep than perceived. - Recommended magnesium  glycinate 200 mg at night.  Right hand pain and stiffness with possible trigger finger Right hand pain and stiffness with inability to fully flex fingers, possibly related to previous trigger finger. Differential includes osteoarthritis or other structural changes. - Ordered right hand x-ray.  Hyperlipidemia Previous elevated cholesterol levels. Recent dietary changes to improve cholesterol levels. - Ordered cholesterol check.  Follow up date to be determined after lab and imaging review

## 2024-02-28 NOTE — Progress Notes (Signed)
 Subjective:    Patient ID: Charlies Rosaline Fetters, female    DOB: 10/09/1965, 58 y.o.   MRN: [id number]  HPI   Discussed the use of AI scribe software for clinical note transcription with the patient, who gave verbal consent to proceed.  History of Present Illness   Agueda Ledora Delker is a 58 year old female who presents with fatigue.  She experiences increasing fatigue over the past month, with an episode of severe weakness preventing her from getting out of bed. There is no chest pain, shortness of breath, or palpitations. She has a history of low vitamin D  levels and past anemia, but recent tests show no current anemia. She is not exercising due to fatigue and work schedule constraints.  Her sleep is disrupted, averaging five hours per night, potentially more according to her watch. She attributes sleep issues to menopause and her work schedule, which involves late shifts. Late-night eating may also affect her sleep. She does not snore and has not used sleep aids.  She has rt hand pain with inability to fully flex all digits.. She is left-handed but uses her right hand for most activities, impacting her ability to hold objects.  She takes valsartan  160 mg daily for blood pressure, with a recent reading of 138/80. A cardiology appointment is scheduled but has been rescheduled multiple times.            Review of Systems  Constitutional:  Positive for fatigue. Negative for chills and fever.  Respiratory:  Negative for choking, shortness of breath and wheezing.   Cardiovascular:  Negative for chest pain and palpitations.  Gastrointestinal:  Negative for abdominal pain, blood in stool and rectal pain.  Genitourinary:  Negative for dysuria and flank pain.  Musculoskeletal:  Negative for back pain and myalgias.  Skin:  Negative for rash.  Neurological:  Negative for dizziness, syncope, weakness and numbness.  Hematological:  Negative for adenopathy.  Psychiatric/Behavioral:   Negative for behavioral problems and decreased concentration.       Past Medical History:  Diagnosis Date   Actinic skin damage 12/29/2022   Anxiety 09/24/2021   Chest pain of uncertain etiology 10/03/2018   Chronic constipation 09/24/2021   Dyspepsia 07/10/2018   Essential hypertension 08/04/2006   Essential hypertension, benign    Family history of malignant neoplasm of breast 09/24/2021   Fibroids    GERD (gastroesophageal reflux disease)    Hemangioma of skin and subcutaneous tissue 12/29/2022   HH (hiatus hernia)    Hx of adenomatous colonic polyps    IBS 08/04/2006   Iron deficiency anemia 09/24/2021   Lentigo 12/29/2022   Liver hemangioma, giant, incidental finding 09/06/2018   Guidelines recommend that you get a contrast-enhanced MRI liver in 6-12 months to make sure it is not significantly increasing in size.  Symptoms such as abdominal pain or bloating would not be very reliable indicators of the activity at this hemangioma.  If she should develop sudden right upper quadrant or flank pain associated with fever and elevated LFTs, it could indicate bleeding into the hem   Menorrhagia 09/24/2021   Osteopenia 10/2016   SK (seborrheic keratosis) 12/29/2022   Skin sensation disturbance 09/24/2021   Vitamin D  deficiency 11/26/2016     Social History   Socioeconomic History   Marital status: Single    Spouse name: Not on file   Number of children: 0   Years of education: Not on file   Highest education level: Not on file  Occupational  History   Occupation: MI service specialist    Employer: [employer]  Tobacco Use   Smoking status: Never    Passive exposure: Never   Smokeless tobacco: Never  Vaping Use   Vaping status: Never Used  Substance and Sexual Activity   Alcohol use: No   Drug use: No   Sexual activity: Not Currently    Birth control/protection: Abstinence, Post-menopausal  Other Topics Concern   Not on file  Social History Narrative   Not on  file   Social Drivers of Health   Financial Resource Strain: Not on file  Food Insecurity: Not on file  Transportation Needs: Not on file  Physical Activity: Not on file  Stress: Not on file  Social Connections: Not on file  Intimate Partner Violence: Not on file    Past Surgical History:  Procedure Laterality Date   BREAST BIOPSY Left 12/03/2021   COLONOSCOPY  01/01/2005   Normal    POLYPECTOMY     UTERINE FIBROID EMBOLIZATION      Family History  Problem Relation Age of Onset   Hodgkin's lymphoma Mother    Alzheimer's disease Father    Breast cancer Sister    Breast cancer Maternal Aunt    Colon cancer Maternal Grandfather    Stroke Paternal Grandfather    Esophageal cancer Neg Hx    Stomach cancer Neg Hx    Rectal cancer Neg Hx    Pancreatic cancer Neg Hx     No Known Allergies  Current Outpatient Medications on File Prior to Visit  Medication Sig Dispense Refill   famotidine  (PEPCID ) 40 MG/5ML suspension Take 10 mg by mouth as needed for heartburn or indigestion.     fluticasone  (FLONASE ) 50 MCG/ACT nasal spray SPRAY 2 SPRAYS INTO EACH NOSTRIL EVERY DAY 48 mL 2   valsartan  (DIOVAN ) 160 MG tablet Take 1 tablet (160 mg total) by mouth daily. 90 tablet 0   fluconazole  (DIFLUCAN ) 150 MG tablet Take 1 tablet (150 mg total) by mouth daily. (Patient not taking: Reported on 02/28/2024) 1 tablet 0   hydrOXYzine  (ATARAX ) 10 MG tablet Take 1 tablet (10 mg total) by mouth 3 (three) times daily as needed for itching. (Patient not taking: Reported on 02/28/2024) 30 tablet 0   levocetirizine (XYZAL ) 5 MG tablet TAKE 1 TABLET BY MOUTH EVERY DAY IN THE EVENING (Patient not taking: Reported on 02/28/2024) 90 tablet 2   ondansetron  (ZOFRAN -ODT) 4 MG disintegrating tablet Take 1 tablet (4 mg total) by mouth every 8 (eight) hours as needed for nausea or vomiting. (Patient not taking: Reported on 02/28/2024) 15 tablet 0   oxyCODONE -acetaminophen  (PERCOCET/ROXICET) 5-325 MG tablet Take 1  tablet by mouth every 6 (six) hours as needed for severe pain (pain score 7-10). (Patient not taking: Reported on 02/28/2024) 15 tablet 0   sucralfate  (CARAFATE ) 1 g tablet Take 1 tablet (1 g total) by mouth 4 (four) times daily -  with meals and at bedtime. (Patient not taking: Reported on 02/28/2024) 28 tablet 0   No current facility-administered medications on file prior to visit.    BP 135/80   Pulse 75   Temp 97.6 F (36.4 C) (Oral)   Resp 14   Ht 5' 7.5 (1.715 m)   Wt 146 lb 12.8 oz (66.6 kg)   LMP  (LMP Unknown)   SpO2 99%   BMI 22.65 kg/m            Objective:   Physical Exam   General Mental  Status- Alert. General Appearance- Not in acute distress.   Skin General: Color- Normal Color. Moisture- Normal Moisture.  Neck Carotid Arteries- Normal color. Moisture- Normal Moisture. No carotid bruits. No JVD.  Chest and Lung Exam Auscultation: Breath Sounds:-Normal.  Cardiovascular Auscultation:Rythm- Regular. Murmurs & Other Heart Sounds:Auscultation of the heart reveals- No Murmurs.  Abdomen Inspection:-Inspeection Normal. Palpation/Percussion:Note:No mass. Palpation and Percussion of the abdomen reveal- Non Tender, Non Distended + BS, no rebound or guarding.   Neurologic Cranial Nerve exam:- CN III-XII intact(No nystagmus), symmetric smile. Strength:- 5/5 equal and symmetric strength both upper and lower extremities.    Rt hand- full extenesion if digits but only partical flexion of rt hand digits.       Assessment & Plan:   Assessment and Plan    Fatigue Intermittent fatigue possibly due to disturbed sleep, vitamin D  deficiency, or metabolic issues. No anemia. Dietary changes and work shift adjustments may contribute. - Ordered CBC, metabolic panel, B12, B1, thyroid  studies, and cholesterol check. - Rechecked vitamin D  levels.  Disturbed sleep (insomnia) Disrupted sleep with difficulty staying asleep, possibly related to work shift and dietary  habits. Sleep tracking indicates more sleep than perceived. - Recommended magnesium  glycinate 200 mg at night.  Right hand pain and stiffness with possible trigger finger Right hand pain and stiffness with inability to fully flex fingers, possibly related to previous trigger finger. Differential includes osteoarthritis or other structural changes. - Ordered right hand x-ray.  Hyperlipidemia Previous elevated cholesterol levels. Recent dietary changes to improve cholesterol levels. - Ordered cholesterol check.        Follow up date to be determined after lab and imaging review.  Jnyah Brazee, PA-C  Follow up date to be determined after lab and imaging review.

## 2024-03-02 ENCOUNTER — Telehealth: Payer: Self-pay

## 2024-03-02 NOTE — Telephone Encounter (Signed)
 Called pt and notified her that the results have not come back yet and we will contact her as soon as they do Copied from CRM #[id number]. Topic: Clinical - Lab/Test Results >> Mar 02, 2024  2:15 PM Brittany M wrote: Reason for CRM: Patient calling to get an update on xray she had done on 11/18

## 2024-03-03 LAB — VITAMIN B1: Vitamin B1 (Thiamine): 11 nmol/L (ref 8–30)

## 2024-03-05 DIAGNOSIS — F329 Major depressive disorder, single episode, unspecified: Secondary | ICD-10-CM | POA: Diagnosis not present

## 2024-03-05 DIAGNOSIS — F419 Anxiety disorder, unspecified: Secondary | ICD-10-CM | POA: Diagnosis not present

## 2024-03-13 ENCOUNTER — Ambulatory Visit: Admitting: Cardiology

## 2024-03-25 ENCOUNTER — Other Ambulatory Visit: Payer: Self-pay | Admitting: Cardiology

## 2024-03-26 ENCOUNTER — Telehealth: Payer: Self-pay | Admitting: Cardiology

## 2024-04-03 DIAGNOSIS — F419 Anxiety disorder, unspecified: Secondary | ICD-10-CM | POA: Diagnosis not present

## 2024-04-03 DIAGNOSIS — F329 Major depressive disorder, single episode, unspecified: Secondary | ICD-10-CM | POA: Diagnosis not present

## 2024-04-11 ENCOUNTER — Telehealth: Payer: Self-pay

## 2024-04-11 NOTE — Telephone Encounter (Signed)
 Copied from CRM [id number]. Topic: Clinical - Medical Advice >> Apr 11, 2024  8:20 AM Anairis L wrote: Reason for CRM: Patient would like to let provider know that she believe atorvastatin  (LIPITOR) 10 MG tablet is giving her back pain for about two weeks. Pain goes from 2-7.   Please call/Please advise.

## 2024-04-11 NOTE — Telephone Encounter (Signed)
 Called pt and gave her pcp advice. She could not come in on the date and time that pcp recommended but did get her scheduled for 04/19/2024

## 2024-04-19 ENCOUNTER — Ambulatory Visit: Admitting: Medical

## 2024-04-19 ENCOUNTER — Ambulatory Visit: Payer: Self-pay | Admitting: Medical

## 2024-04-19 VITALS — BP 128/86 | Temp 97.5°F | Resp 15 | Ht 67.5 in | Wt 147.4 lb

## 2024-04-19 DIAGNOSIS — M65322 Trigger finger, left index finger: Secondary | ICD-10-CM

## 2024-04-19 DIAGNOSIS — E785 Hyperlipidemia, unspecified: Secondary | ICD-10-CM | POA: Diagnosis not present

## 2024-04-19 DIAGNOSIS — M25561 Pain in right knee: Secondary | ICD-10-CM | POA: Diagnosis not present

## 2024-04-19 DIAGNOSIS — R739 Hyperglycemia, unspecified: Secondary | ICD-10-CM | POA: Diagnosis not present

## 2024-04-19 DIAGNOSIS — M545 Low back pain, unspecified: Secondary | ICD-10-CM | POA: Diagnosis not present

## 2024-04-19 DIAGNOSIS — G8929 Other chronic pain: Secondary | ICD-10-CM

## 2024-04-19 DIAGNOSIS — M2550 Pain in unspecified joint: Secondary | ICD-10-CM

## 2024-04-19 DIAGNOSIS — E559 Vitamin D deficiency, unspecified: Secondary | ICD-10-CM

## 2024-04-19 LAB — COMPREHENSIVE METABOLIC PANEL WITH GFR
ALT: 14 U/L (ref 3–35)
AST: 15 U/L (ref 5–37)
Albumin: 4 g/dL (ref 3.5–5.2)
Alkaline Phosphatase: 62 U/L (ref 39–117)
BUN: 19 mg/dL (ref 6–23)
CO2: 30 meq/L (ref 19–32)
Calcium: 9.4 mg/dL (ref 8.4–10.5)
Chloride: 104 meq/L (ref 96–112)
Creatinine, Ser: 0.81 mg/dL (ref 0.40–1.20)
GFR: 79.31 mL/min
Glucose, Bld: 81 mg/dL (ref 70–99)
Potassium: 4.1 meq/L (ref 3.5–5.1)
Sodium: 138 meq/L (ref 135–145)
Total Bilirubin: 0.6 mg/dL (ref 0.2–1.2)
Total Protein: 6.5 g/dL (ref 6.0–8.3)

## 2024-04-19 LAB — LIPID PANEL
Cholesterol: 182 mg/dL (ref 28–200)
HDL: 77.8 mg/dL
LDL Cholesterol: 94 mg/dL (ref 10–99)
NonHDL: 104.41
Total CHOL/HDL Ratio: 2
Triglycerides: 50 mg/dL (ref 10.0–149.0)
VLDL: 10 mg/dL (ref 0.0–40.0)

## 2024-04-19 LAB — HEMOGLOBIN A1C: Hgb A1c MFr Bld: 5.5 % (ref 4.6–6.5)

## 2024-04-19 MED ORDER — VITAMIN D (ERGOCALCIFEROL) 1.25 MG (50000 UNIT) PO CAPS: 50000.0000 [IU] | ORAL_CAPSULE | ORAL | 0 refills | Status: AC

## 2024-04-19 NOTE — Progress Notes (Signed)
 "  Subjective:    Patient ID: Erica [name], female    DOB: [birth year], 59 y.o.   MRN: [id number]  HPI  Erica [name] is a 59 year old female who presents with fatigue and hand pain.  She has persistent fatigue that she relates to previously documented vitamin D  deficiency, with a level of 18. She was prescribed weekly vitamin D  for eight weeks but had a three-week interruption after the first four doses due to a pharmacy error. Fatigue has continued despite resuming supplements. Prior workup showed normal thyroid  function, B12, B1, and metabolic panel.  She developed lower back pain around the time she started a cholesterol medication in November. The pain has lasted 6 to 8 weeks but is improving. It was initially 7/10 in severity and is now less intense. There is no radiation into the legs, no shooting pain through the buttocks, and no clear activity trigger. Pt stopped statin about 7 weeks ago and pain persists so doubt that caused pain.  She has significant bilateral hand pain and stiffness, worse in the right hand, with multiple fingers showing  finger with decreased range of motion. Left index finger triggers. Pain and stiffness are severe, constant, and worsen with computer use rt hand, and are not limited to mornings. She previously fractured her left wrist and had surgical repair. She saw an orthopedist for hand issues, but the trigger finger was not evaluated.  She reports a history of right knee pain with prior swelling and crepitus. An x-ray in 2017 showed changes, details unknown(can't find that report in epic) . Currently she has minimal knee pain.  Her mother had similar hand and back problems, including trigger finger.     Review of Systems  Constitutional:  Positive for fatigue. Negative for chills and fever.  Respiratory:  Negative for cough, chest tightness and wheezing.   Cardiovascular:  Negative for chest pain and palpitations.  Gastrointestinal:  Negative for abdominal  pain, diarrhea, nausea and vomiting.  Genitourinary:  Negative for dysuria, flank pain and frequency.  Musculoskeletal:  Positive for arthralgias.       See hpi.  Skin:  Negative for rash.  Neurological:  Negative for dizziness, speech difficulty, weakness and headaches.  Hematological:  Negative for adenopathy.  Psychiatric/Behavioral:  Negative for behavioral problems and dysphoric mood.     Past Medical History:  Diagnosis Date   Actinic skin damage 12/29/2022   Anxiety 09/24/2021   Chest pain of uncertain etiology 10/03/2018   Chronic constipation 09/24/2021   DOE (dyspnea on exertion) 12/30/2022   Dyspepsia 07/10/2018   Essential hypertension 08/04/2006   Essential hypertension, benign    Extremely dense tissue of both breasts on mammography 01/03/2023   Family history of malignant neoplasm of breast 09/24/2021   Fibroids    GERD (gastroesophageal reflux disease)    Hemangioma of skin and subcutaneous tissue 12/29/2022   HH (hiatus hernia)    Hx of adenomatous colonic polyps    IBS 08/04/2006   Incontinence of feces 01/03/2023   Iron deficiency anemia 09/24/2021   Lentigo 12/29/2022   Liver hemangioma, giant, incidental finding 09/06/2018   Guidelines recommend that you get a contrast-enhanced MRI liver in 6-12 months to make sure it is not significantly increasing in size.  Symptoms such as abdominal pain or bloating would not be very reliable indicators of the activity at this hemangioma.  If she should develop sudden right upper quadrant or flank pain associated with fever and elevated LFTs, it could indicate bleeding  into the hem   Menorrhagia 09/24/2021   Osteopenia 10/2016   SK (seborrheic keratosis) 12/29/2022   Skin sensation disturbance 09/24/2021   Vitamin D  deficiency 11/26/2016     Social History   Socioeconomic History   Marital status: Single    Spouse name: Not on file   Number of children: 0   Years of education: Not on file   Highest education  level: Not on file  Occupational History   Occupation: MI service specialist    Employer: [employer]  Tobacco Use   Smoking status: Never    Passive exposure: Never   Smokeless tobacco: Never  Vaping Use   Vaping status: Never Used  Substance and Sexual Activity   Alcohol use: No   Drug use: No   Sexual activity: Not Currently    Birth control/protection: Abstinence, Post-menopausal  Other Topics Concern   Not on file  Social History Narrative   Not on file   Social Drivers of Health   Tobacco Use: Low Risk (02/28/2024)   Patient History    Smoking Tobacco Use: Never    Smokeless Tobacco Use: Never    Passive Exposure: Never  Financial Resource Strain: Not on file  Food Insecurity: Not on file  Transportation Needs: Not on file  Physical Activity: Not on file  Stress: Not on file  Social Connections: Not on file  Intimate Partner Violence: Not on file  Depression (PHQ2-9): Low Risk (01/09/2024)   Depression (PHQ2-9)    PHQ-2 Score: 1  Alcohol Screen: Not on file  Housing: Not on file  Utilities: Not on file  Health Literacy: Not on file    Past Surgical History:  Procedure Laterality Date   BREAST BIOPSY Left 12/03/2021   COLONOSCOPY  01/01/2005   Normal    POLYPECTOMY     UTERINE FIBROID EMBOLIZATION      Family History  Problem Relation Age of Onset   Hodgkin's lymphoma Mother    Alzheimer's disease Father    Breast cancer Sister    Breast cancer Maternal Aunt    Colon cancer Maternal Grandfather    Stroke Paternal Grandfather    Esophageal cancer Neg Hx    Stomach cancer Neg Hx    Rectal cancer Neg Hx    Pancreatic cancer Neg Hx     Allergies[1]  Medications Ordered Prior to Encounter[2]  BP 128/86   Temp (!) 97.5 F (36.4 C) (Oral)   Resp 15   Ht 5' 7.5 (1.715 m)   Wt 147 lb 6.4 oz (66.9 kg)   LMP  (LMP Unknown)   BMI 22.75 kg/m         Objective:   Physical Exam  General Mental Status- Alert. General Appearance- Not in  acute distress.   Skin General: Color- Normal Color. Moisture- Normal Moisture.  Neck  No JVD.  Chest and Lung Exam Auscultation: Breath Sounds:-CTA  Cardiovascular Auscultation:Rythm- RRR Murmurs & Other Heart Sounds:Auscultation of the heart reveals- No Murmurs.  Abdomen Inspection:-Inspeection Normal. Palpation/Percussion:Note:No mass. Palpation and Percussion of the abdomen reveal- Non Tender, Non Distended + BS, no rebound or guarding.   Neurologic Cranial Nerve exam:- CN III-XII intact(No nystagmus), symmetric smile. Strength:- 5/5 equal and symmetric strength both upper and lower extremities.   Rt knee- mild crepitus on range of motion. Rt hand- oa type changes on inspection and decreased flexion of digits. Lt hand- left 2nd digit triggers.    Assessment & Plan:  Vitamin D  deficiency Level of 18, likely  contributing to fatigue. Previous workup for fatigue negative for other causes. Fatigue may be related to low vitamin D  levels. - Prescribed vitamin D  8 capsules once a week for 8 weeks. - Scheduled follow-up in 9 weeks to repeat vitamin D  level and assess energy levels.  Chronic low back pain Persisting for 6-8 weeks, improved from 7/10 to 1/10. Localized to lower lumbar region, not related to cholesterol medication. - Ordered lumbar spine x-ray to assess for degenerative changes. - Recommended Tylenol  500 mg and ibuprofen  200 mg every 8 hours as needed for pain.  Osteoarthritis and trigger finger of the hands Osteoarthritis and trigger finger in both hands with significant stiffness and pain, especially in the right hand. Symptoms worsened since a fall last year. - Referred to hand specialist for further evaluation and management.  Chronic right knee pain with possible osteoarthritis Chronic right knee pain with crepitus and occasional swelling. Previous x-rays showed osteoarthritis changes. - Ordered right knee x-ray to assess for osteoarthritis  changes.  Hyperlipidemia Previous statin therapy. She expressed desire to discontinue medication due to perceived dietary improvements. Statin side effects unlikely. - Ordered lipid panel to assess current cholesterol levels. - Tentatively recommended resuming statin therapy pending lipid panel results.  Prediabetes Previous elevated blood sugar levels. Interested in monitoring to prevent progression to diabetes. - Ordered A1c test to assess current glycemic control.   Follow up 9 weeks or sooner if needed   I personally spent a total of 45 minutes in the care of the patient today including performing a medically appropriate exam/evaluation, counseling and educating, placing orders, referring and communicating with other health care professionals, and documenting clinical information in the EHR.       [1] No Known Allergies [2]  Current Outpatient Medications on File Prior to Visit  Medication Sig Dispense Refill   atorvastatin  (LIPITOR) 10 MG tablet Take 1 tablet (10 mg total) by mouth daily. 90 tablet 3   famotidine  (PEPCID ) 40 MG/5ML suspension Take 10 mg by mouth as needed for heartburn or indigestion.     fluticasone  (FLONASE ) 50 MCG/ACT nasal spray SPRAY 2 SPRAYS INTO EACH NOSTRIL EVERY DAY 48 mL 2   valsartan  (DIOVAN ) 160 MG tablet TAKE 1 TABLET BY MOUTH EVERY DAY 90 tablet 0   Vitamin D , Ergocalciferol , (DRISDOL ) 1.25 MG (50000 UNIT) CAPS capsule Take 1 capsule (50,000 Units total) by mouth every 7 (seven) days. 8 capsule 0   fluconazole  (DIFLUCAN ) 150 MG tablet Take 1 tablet (150 mg total) by mouth daily. (Patient not taking: Reported on 04/19/2024) 1 tablet 0   hydrOXYzine  (ATARAX ) 10 MG tablet Take 1 tablet (10 mg total) by mouth 3 (three) times daily as needed for itching. (Patient not taking: Reported on 04/19/2024) 30 tablet 0   levocetirizine (XYZAL ) 5 MG tablet TAKE 1 TABLET BY MOUTH EVERY DAY IN THE EVENING (Patient not taking: Reported on 04/19/2024) 90 tablet 2    ondansetron  (ZOFRAN -ODT) 4 MG disintegrating tablet Take 1 tablet (4 mg total) by mouth every 8 (eight) hours as needed for nausea or vomiting. (Patient not taking: Reported on 04/19/2024) 15 tablet 0   oxyCODONE -acetaminophen  (PERCOCET/ROXICET) 5-325 MG tablet Take 1 tablet by mouth every 6 (six) hours as needed for severe pain (pain score 7-10). (Patient not taking: Reported on 04/19/2024) 15 tablet 0   sucralfate  (CARAFATE ) 1 g tablet Take 1 tablet (1 g total) by mouth 4 (four) times daily -  with meals and at bedtime. (Patient not taking: Reported on 04/19/2024) 28  tablet 0   No current facility-administered medications on file prior to visit.   "

## 2024-04-19 NOTE — Patient Instructions (Signed)
 Vitamin D  deficiency Level of 18, likely contributing to fatigue. Previous workup for fatigue negative for other causes. Fatigue may be related to low vitamin D  levels. - Prescribed vitamin D  8 capsules once a week for 8 weeks. - Scheduled follow-up in 9 weeks to repeat vitamin D  level and assess energy levels.  Chronic low back pain Persisting for 6-8 weeks, improved from 7/10 to 1/10. Localized to lower lumbar region, not related to cholesterol medication. - Ordered lumbar spine x-ray to assess for degenerative changes. - Recommended Tylenol  500 mg and ibuprofen  200 mg every 8 hours as needed for pain.  Osteoarthritis and trigger finger of the hands Osteoarthritis and trigger finger in both hands with significant stiffness and pain, especially in the right hand. Symptoms worsened since a fall last year. - Referred to hand specialist for further evaluation and management.  Chronic right knee pain with possible osteoarthritis Chronic right knee pain with crepitus and occasional swelling. Previous x-rays showed osteoarthritis changes. - Ordered right knee x-ray to assess for osteoarthritis changes.  Hyperlipidemia Previous statin therapy. She expressed desire to discontinue medication due to perceived dietary improvements. Statin side effects unlikely. - Ordered lipid panel to assess current cholesterol levels. - Tentatively recommended resuming statin therapy pending lipid panel results.  Prediabetes Previous elevated blood sugar levels. Interested in monitoring to prevent progression to diabetes. - Ordered A1c test to assess current glycemic control.   Follow up 9 weeks or sooner if needed

## 2024-05-02 ENCOUNTER — Ambulatory Visit: Admitting: Orthopaedic Surgery

## 2024-05-08 ENCOUNTER — Ambulatory Visit: Admitting: Cardiology

## 2024-05-08 ENCOUNTER — Ambulatory Visit: Admitting: Orthopaedic Surgery

## 2024-05-09 ENCOUNTER — Ambulatory Visit: Admitting: Orthopaedic Surgery

## 2024-05-09 DIAGNOSIS — D8989 Other specified disorders involving the immune mechanism, not elsewhere classified: Secondary | ICD-10-CM

## 2024-05-09 DIAGNOSIS — M25641 Stiffness of right hand, not elsewhere classified: Secondary | ICD-10-CM

## 2024-05-09 DIAGNOSIS — M79641 Pain in right hand: Secondary | ICD-10-CM

## 2024-05-09 NOTE — Progress Notes (Signed)
 "  Office Visit Note   Patient: Terree Gaultney           Date of Birth: Jan 29, 1966           MRN: [id number] Visit Date: 05/09/2024              Requested by: Dorina Loving, PA-C [id number] [place],  [place] [address] PCP: Dorina Loving, PA-C   Assessment & Plan: Visit Diagnoses:  1. Pain in right hand   2. Decreased range of motion of finger of right hand   3. Suspect Autoimmune disorder     Plan: HPI and physical exam are more consistent with progressive autoimmune issue than a true orthopedic issue.  Will initiate treatment today by doing screening lab work including arthritis panel and checking ANA.  If lab work shows evidence of autoimmune concerns will send referral to rheumatology for further management of treatment.  Patient verbalizes understanding and agrees to treatment plan.  Follow-Up Instructions: Return if symptoms worsen or fail to improve.   Orders:  Orders Placed This Encounter  Procedures   Uric acid   Rheumatoid Factor   Sed Rate (ESR)   Antinuclear Antib (ANA)   No orders of the defined types were placed in this encounter.     Procedures: No procedures performed   Clinical Data: No additional findings.   Subjective: Chief Complaint  Patient presents with   Right Hand - Pain    HPI  Patient is a 59 year old female who presents today complaining of progressive deficits in her right hand that have occurred over the last year or more.  Says she has pain along all of the joints in her right hand and has trouble making a fist.  Has been unable to fully use right hand to fully grasp objects for quite some time.  Did have a fall while rollerskating in May of last year but issues proceeded to this fall.  No fractures to right hand during rollerskating event.  Previous x-rays taken were within normal limits.  Patient does have polyarthropathy in other joints as well.  Review of Systems  Constitutional: Negative.   HENT: Negative.     Eyes: Negative.   Respiratory: Negative.    Cardiovascular: Negative.   Endocrine: Negative.   Musculoskeletal: Negative.   Neurological: Negative.   Hematological: Negative.   Psychiatric/Behavioral: Negative.    All other systems reviewed and are negative.   ROS negative except as noted in HPI above   Objective: Vital Signs: LMP  (LMP Unknown)   Physical Exam Vitals and nursing note reviewed.  Constitutional:      Appearance: She is well-developed.  HENT:     Head: Atraumatic.     Nose: Nose normal.  Eyes:     Extraocular Movements: Extraocular movements intact.  Cardiovascular:     Pulses: Normal pulses.  Pulmonary:     Effort: Pulmonary effort is normal.  Abdominal:     Palpations: Abdomen is soft.  Musculoskeletal:     Cervical back: Neck supple.  Skin:    General: Skin is warm.     Capillary Refill: Capillary refill takes less than 2 seconds.  Neurological:     Mental Status: She is alert. Mental status is at baseline.  Psychiatric:        Behavior: Behavior normal.        Thought Content: Thought content normal.        Judgment: Judgment normal.     Ortho  Exam  On examination of patient's left hand evidence of scleroderma-like changes to dorsal aspect of all fingers.  Patient has shiny skin with decreased flexion lines over interphalangeal joints.  No erythema or ecchymoses present.  Does have some rheumatic nodules along MCP, PIP and DIP joints.  Patient has minimal tenderness to palpation over bony landmarks of right hand.  Patient has decreased range of motion of all fingers with limited flexion.  No evidence of intrinsic muscle weakness or hand muscle atrophy.  Tinel's test negative over median nerve.  Tinel's test negative over ulnar nerve at elbow.  Neurovascularly intact distally of both upper extremities.  Specialty Comments:  No specialty comments available.  Imaging: No results found.   PMFS History: Patient Active Problem List   Diagnosis  Date Noted   Incontinence of feces 01/03/2023   Extremely dense tissue of both breasts on mammography 01/03/2023   DOE (dyspnea on exertion) 12/30/2022   Actinic skin damage 12/29/2022   Hemangioma of skin and subcutaneous tissue 12/29/2022   Lentigo 12/29/2022   SK (seborrheic keratosis) 12/29/2022   Essential hypertension, benign    Chronic constipation 09/24/2021   Anxiety 09/24/2021   Family history of malignant neoplasm of breast 09/24/2021   Iron deficiency anemia 09/24/2021   Menorrhagia 09/24/2021   Skin sensation disturbance 09/24/2021   HH (hiatus hernia) 09/22/2021   GERD (gastroesophageal reflux disease) 09/22/2021   Fibroids 09/22/2021   Chest pain of uncertain etiology 10/03/2018   Liver hemangioma, giant, incidental finding 09/06/2018   Dyspepsia 07/10/2018   Osteopenia 11/26/2016   Vitamin D  deficiency 11/26/2016   Hx of adenomatous colonic polyps 09/29/2016   Essential hypertension 08/04/2006   IBS 08/04/2006   Past Medical History:  Diagnosis Date   Actinic skin damage 12/29/2022   Anxiety 09/24/2021   Chest pain of uncertain etiology 10/03/2018   Chronic constipation 09/24/2021   DOE (dyspnea on exertion) 12/30/2022   Dyspepsia 07/10/2018   Essential hypertension 08/04/2006   Essential hypertension, benign    Extremely dense tissue of both breasts on mammography 01/03/2023   Family history of malignant neoplasm of breast 09/24/2021   Fibroids    GERD (gastroesophageal reflux disease)    Hemangioma of skin and subcutaneous tissue 12/29/2022   HH (hiatus hernia)    Hx of adenomatous colonic polyps    IBS 08/04/2006   Incontinence of feces 01/03/2023   Iron deficiency anemia 09/24/2021   Lentigo 12/29/2022   Liver hemangioma, giant, incidental finding 09/06/2018   Guidelines recommend that you get a contrast-enhanced MRI liver in 6-12 months to make sure it is not significantly increasing in size.  Symptoms such as abdominal pain or bloating would not  be very reliable indicators of the activity at this hemangioma.  If she should develop sudden right upper quadrant or flank pain associated with fever and elevated LFTs, it could indicate bleeding into the hem   Menorrhagia 09/24/2021   Osteopenia 10/2016   SK (seborrheic keratosis) 12/29/2022   Skin sensation disturbance 09/24/2021   Vitamin D  deficiency 11/26/2016    Family History  Problem Relation Age of Onset   Hodgkin's lymphoma Mother    Alzheimer's disease Father    Breast cancer Sister    Breast cancer Maternal Aunt    Colon cancer Maternal Grandfather    Stroke Paternal Grandfather    Esophageal cancer Neg Hx    Stomach cancer Neg Hx    Rectal cancer Neg Hx    Pancreatic cancer Neg Hx  Past Surgical History:  Procedure Laterality Date   BREAST BIOPSY Left 12/03/2021   COLONOSCOPY  01/01/2005   Normal    POLYPECTOMY     UTERINE FIBROID EMBOLIZATION     Social History   Occupational History   Occupation: MI service specialist    Employer: [employer]  Tobacco Use   Smoking status: Never    Passive exposure: Never   Smokeless tobacco: Never  Vaping Use   Vaping status: Never Used  Substance and Sexual Activity   Alcohol use: No   Drug use: No   Sexual activity: Not Currently    Birth control/protection: Abstinence, Post-menopausal        "

## 2024-05-10 ENCOUNTER — Telehealth: Payer: Self-pay | Admitting: Orthopaedic Surgery

## 2024-05-10 NOTE — Telephone Encounter (Signed)
 Pt called back wanting to know if her results have come in. Call back number is [phone].

## 2024-05-10 NOTE — Telephone Encounter (Signed)
 Waiting on results

## 2024-05-10 NOTE — Telephone Encounter (Signed)
 Patient called. She would like the results of her blood work. Her cb# [phone]

## 2024-05-10 NOTE — Telephone Encounter (Signed)
 No results as of yet. Patient is VERY anxious to get results when you have them.

## 2024-05-11 ENCOUNTER — Telehealth: Payer: Self-pay

## 2024-05-11 NOTE — Telephone Encounter (Signed)
 Called pt an advised her that the lab results were not back yet. Usually takes 24 to 48 hours but still no results. Advised pt that should have results by Monday     Copied from CRM #[id number]. Topic: General - Other >> May 10, 2024  2:57 PM Joesph B wrote: Reason for CRM: patient wanting results from orthopedic, advised her that provider may have not received them yet >> May 11, 2024  3:33 PM Drema MATSU wrote: Pt is calling to get an update on results.

## 2024-05-15 ENCOUNTER — Telehealth: Payer: Self-pay

## 2024-05-15 NOTE — Telephone Encounter (Signed)
 I only see the printed RF results which are normal.  I do not see anything resulted in the computer.  Do we have ANA, sed rate and uric acid results?

## 2024-05-15 NOTE — Telephone Encounter (Unsigned)
 Copied from CRM [id number]. Topic: General - Other >> May 10, 2024  2:57 PM Joesph B wrote: Reason for CRM: patient wanting results from orthopedic, advised her that provider may have not received them yet >> May 15, 2024 10:41 AM Robinson H wrote: Patient following up on lab results, advised patient no results are in or messages in system for her. States she's still feeling weak.  Heydi [phone] Okay to leave message >> May 11, 2024  3:33 PM Drema MATSU wrote: Pt is calling to get an update on results.

## 2024-05-15 NOTE — Telephone Encounter (Signed)
 Called pt and let her know that she will have to wait for lab results to come back. Also it may take a little longer than normal due to the weather. No answer left message to return call.

## 2024-05-15 NOTE — Telephone Encounter (Signed)
 Gave patient RF results which were negative.told her we are still waiting for the other labs to result and would let her know as soon as we had the results

## 2024-05-15 NOTE — Telephone Encounter (Signed)
 Patient left  a VM on Triage line. She would like to know lab results. States she is weak and tired and would like a CB to discuss labs and next steps.   CB [phone]

## 2024-05-17 ENCOUNTER — Telehealth: Payer: Self-pay

## 2024-05-17 NOTE — Telephone Encounter (Signed)
 Spoke to patient two days ago and gave results that we had then.  Just gave her results of negative uric acid, sed rate and again RF.  Told her we are still waiting on ANA and will call her once we have results

## 2024-05-17 NOTE — Telephone Encounter (Signed)
 Patient called again, frustrated. She wants the results of all her labs. I called [facility] yesterday evening. Results have all posted, except the ANA. [facility] states that the ANA test was sent to [facility] and may take some time to result. Please call with results of what we have a this point. Paper on your desk.

## 2024-05-18 ENCOUNTER — Telehealth: Payer: Self-pay

## 2024-05-18 NOTE — Telephone Encounter (Signed)
 Pt called and notified.

## 2024-05-18 NOTE — Addendum Note (Signed)
 Addended by: DORINA DALLAS DORINA PA-C M on: 05/18/2024 12:54 PM   Modules accepted: Orders

## 2024-05-18 NOTE — Telephone Encounter (Signed)
 Copied from CRM #[id number]. Topic: Clinical - Request for Lab/Test Order >> May 18, 2024  8:32 AM Erica [name] wrote: Reason for CRM: Patient would like to know is she could have xray orders for lumbar and knee sent to a radiology department instead of having done in office. She stated that her insurance would cover if she got done at radiology department instead of in office. Please advise. Please call patient to let her know when orders have been placed and with a location

## 2024-06-06 ENCOUNTER — Ambulatory Visit: Admitting: Cardiology

## 2024-06-21 ENCOUNTER — Ambulatory Visit: Admitting: Medical
